# Patient Record
Sex: Female | Born: 1945
Health system: Southern US, Community
[De-identification: ages and names within clinical notes are randomized; demographics above are authoritative.]

## PROBLEM LIST (undated history)

## (undated) DIAGNOSIS — J019 Acute sinusitis, unspecified: Secondary | ICD-10-CM

## (undated) DIAGNOSIS — C801 Malignant (primary) neoplasm, unspecified: Secondary | ICD-10-CM

## (undated) DIAGNOSIS — I639 Cerebral infarction, unspecified: Secondary | ICD-10-CM

## (undated) DIAGNOSIS — Z8719 Personal history of other diseases of the digestive system: Secondary | ICD-10-CM

## (undated) DIAGNOSIS — Z8673 Personal history of transient ischemic attack (TIA), and cerebral infarction without residual deficits: Secondary | ICD-10-CM

## (undated) DIAGNOSIS — K219 Gastro-esophageal reflux disease without esophagitis: Secondary | ICD-10-CM

## (undated) DIAGNOSIS — Z860100 Personal history of colon polyps, unspecified: Secondary | ICD-10-CM

## (undated) DIAGNOSIS — Z86718 Personal history of other venous thrombosis and embolism: Secondary | ICD-10-CM

## (undated) DIAGNOSIS — Z85828 Personal history of other malignant neoplasm of skin: Secondary | ICD-10-CM

## (undated) DIAGNOSIS — E785 Hyperlipidemia, unspecified: Secondary | ICD-10-CM

## (undated) DIAGNOSIS — G250 Essential tremor: Secondary | ICD-10-CM

## (undated) DIAGNOSIS — H269 Unspecified cataract: Secondary | ICD-10-CM

## (undated) DIAGNOSIS — M1990 Unspecified osteoarthritis, unspecified site: Secondary | ICD-10-CM

## (undated) DIAGNOSIS — Z8489 Family history of other specified conditions: Secondary | ICD-10-CM

## (undated) DIAGNOSIS — Z8774 Personal history of (corrected) congenital malformations of heart and circulatory system: Secondary | ICD-10-CM

## (undated) DIAGNOSIS — Z9889 Other specified postprocedural states: Secondary | ICD-10-CM

## (undated) DIAGNOSIS — T148XXA Other injury of unspecified body region, initial encounter: Secondary | ICD-10-CM

## (undated) DIAGNOSIS — K5792 Diverticulitis of intestine, part unspecified, without perforation or abscess without bleeding: Secondary | ICD-10-CM

## (undated) DIAGNOSIS — M25862 Other specified joint disorders, left knee: Secondary | ICD-10-CM

## (undated) HISTORY — DX: Unspecified cataract: H26.9

## (undated) HISTORY — PX: PATENT FORAMEN OVALE CLOSURE: SHX2181

## (undated) HISTORY — PX: COLONOSCOPY: SHX174

## (undated) HISTORY — PX: UPPER GASTROINTESTINAL ENDOSCOPY: SHX188

## (undated) HISTORY — PX: POLYPECTOMY: SHX149

## (undated) HISTORY — DX: Hyperlipidemia, unspecified: E78.5

## (undated) HISTORY — PX: TONSILLECTOMY: SUR1361

## (undated) HISTORY — PX: KNEE ARTHROSCOPY: SUR90

## (undated) HISTORY — PX: APPENDECTOMY: SHX54

## (undated) HISTORY — PX: TOENAIL TRIMMING: SHX6631

---

## 1980-03-04 HISTORY — PX: ABDOMINAL HYSTERECTOMY: SHX81

## 1997-03-04 HISTORY — PX: LAPAROSCOPIC CHOLECYSTECTOMY: SUR755

## 1998-10-05 ENCOUNTER — Other Ambulatory Visit: Admission: RE | Admit: 1998-10-05 | Discharge: 1998-10-05 | Payer: Self-pay | Admitting: Obstetrics & Gynecology

## 1999-06-08 ENCOUNTER — Ambulatory Visit (HOSPITAL_COMMUNITY): Admission: RE | Admit: 1999-06-08 | Discharge: 1999-06-08 | Payer: Self-pay | Admitting: Orthopedic Surgery

## 1999-10-08 ENCOUNTER — Other Ambulatory Visit: Admission: RE | Admit: 1999-10-08 | Discharge: 1999-10-08 | Payer: Self-pay | Admitting: Obstetrics & Gynecology

## 2000-10-02 ENCOUNTER — Other Ambulatory Visit: Admission: RE | Admit: 2000-10-02 | Discharge: 2000-10-02 | Payer: Self-pay | Admitting: Obstetrics and Gynecology

## 2001-10-02 ENCOUNTER — Other Ambulatory Visit: Admission: RE | Admit: 2001-10-02 | Discharge: 2001-10-02 | Payer: Self-pay | Admitting: Obstetrics and Gynecology

## 2002-10-13 ENCOUNTER — Other Ambulatory Visit: Admission: RE | Admit: 2002-10-13 | Discharge: 2002-10-13 | Payer: Self-pay | Admitting: Obstetrics and Gynecology

## 2003-11-27 ENCOUNTER — Emergency Department (HOSPITAL_COMMUNITY): Admission: EM | Admit: 2003-11-27 | Discharge: 2003-11-27 | Payer: Self-pay | Admitting: Emergency Medicine

## 2003-12-07 ENCOUNTER — Encounter: Admission: RE | Admit: 2003-12-07 | Discharge: 2003-12-07 | Payer: Self-pay | Admitting: Orthopedic Surgery

## 2004-11-27 ENCOUNTER — Encounter: Admission: RE | Admit: 2004-11-27 | Discharge: 2004-11-27 | Payer: Self-pay | Admitting: Specialist

## 2004-12-31 ENCOUNTER — Other Ambulatory Visit: Admission: RE | Admit: 2004-12-31 | Discharge: 2004-12-31 | Payer: Self-pay | Admitting: Family Medicine

## 2005-03-04 DIAGNOSIS — I639 Cerebral infarction, unspecified: Secondary | ICD-10-CM

## 2005-03-04 HISTORY — DX: Cerebral infarction, unspecified: I63.9

## 2005-03-15 ENCOUNTER — Inpatient Hospital Stay (HOSPITAL_COMMUNITY): Admission: AD | Admit: 2005-03-15 | Discharge: 2005-03-19 | Payer: Self-pay | Admitting: Neurology

## 2005-03-15 ENCOUNTER — Encounter: Payer: Self-pay | Admitting: Neurology

## 2005-03-18 ENCOUNTER — Encounter (INDEPENDENT_AMBULATORY_CARE_PROVIDER_SITE_OTHER): Payer: Self-pay | Admitting: *Deleted

## 2005-03-19 ENCOUNTER — Encounter (INDEPENDENT_AMBULATORY_CARE_PROVIDER_SITE_OTHER): Payer: Self-pay | Admitting: *Deleted

## 2005-06-25 ENCOUNTER — Encounter: Admission: RE | Admit: 2005-06-25 | Discharge: 2005-06-25 | Payer: Self-pay | Admitting: Cardiology

## 2005-07-01 ENCOUNTER — Ambulatory Visit (HOSPITAL_COMMUNITY): Admission: RE | Admit: 2005-07-01 | Discharge: 2005-07-02 | Payer: Self-pay | Admitting: Cardiology

## 2007-05-22 ENCOUNTER — Observation Stay (HOSPITAL_COMMUNITY): Admission: EM | Admit: 2007-05-22 | Discharge: 2007-05-23 | Payer: Self-pay | Admitting: Emergency Medicine

## 2007-07-20 ENCOUNTER — Ambulatory Visit: Payer: Self-pay | Admitting: Gastroenterology

## 2007-07-28 ENCOUNTER — Telehealth: Payer: Self-pay | Admitting: Gastroenterology

## 2007-08-03 ENCOUNTER — Ambulatory Visit: Payer: Self-pay | Admitting: Gastroenterology

## 2007-08-30 ENCOUNTER — Encounter: Admission: RE | Admit: 2007-08-30 | Discharge: 2007-08-30 | Payer: Self-pay | Admitting: Sports Medicine

## 2007-10-05 ENCOUNTER — Telehealth: Payer: Self-pay | Admitting: Gastroenterology

## 2007-10-22 DIAGNOSIS — Z860100 Personal history of colon polyps, unspecified: Secondary | ICD-10-CM | POA: Insufficient documentation

## 2007-10-22 DIAGNOSIS — K5730 Diverticulosis of large intestine without perforation or abscess without bleeding: Secondary | ICD-10-CM | POA: Insufficient documentation

## 2007-10-22 DIAGNOSIS — M810 Age-related osteoporosis without current pathological fracture: Secondary | ICD-10-CM | POA: Insufficient documentation

## 2007-10-22 DIAGNOSIS — R259 Unspecified abnormal involuntary movements: Secondary | ICD-10-CM

## 2007-10-22 DIAGNOSIS — E785 Hyperlipidemia, unspecified: Secondary | ICD-10-CM

## 2007-10-22 DIAGNOSIS — K644 Residual hemorrhoidal skin tags: Secondary | ICD-10-CM | POA: Insufficient documentation

## 2007-10-22 DIAGNOSIS — K219 Gastro-esophageal reflux disease without esophagitis: Secondary | ICD-10-CM

## 2007-10-22 DIAGNOSIS — K648 Other hemorrhoids: Secondary | ICD-10-CM | POA: Insufficient documentation

## 2007-10-22 DIAGNOSIS — K589 Irritable bowel syndrome without diarrhea: Secondary | ICD-10-CM | POA: Insufficient documentation

## 2007-10-23 ENCOUNTER — Ambulatory Visit: Payer: Self-pay | Admitting: Gastroenterology

## 2007-10-23 DIAGNOSIS — Z8679 Personal history of other diseases of the circulatory system: Secondary | ICD-10-CM | POA: Insufficient documentation

## 2007-10-23 LAB — CONVERTED CEMR LAB: Tissue Transglutaminase Ab, IgA: 0.5 units (ref <7)

## 2007-10-28 ENCOUNTER — Telehealth: Payer: Self-pay | Admitting: Gastroenterology

## 2007-10-30 ENCOUNTER — Encounter: Payer: Self-pay | Admitting: Gastroenterology

## 2007-11-04 ENCOUNTER — Ambulatory Visit (HOSPITAL_BASED_OUTPATIENT_CLINIC_OR_DEPARTMENT_OTHER): Admission: RE | Admit: 2007-11-04 | Discharge: 2007-11-04 | Payer: Self-pay | Admitting: Orthopedic Surgery

## 2008-01-12 ENCOUNTER — Other Ambulatory Visit: Admission: RE | Admit: 2008-01-12 | Discharge: 2008-01-12 | Payer: Self-pay | Admitting: Family Medicine

## 2008-04-14 ENCOUNTER — Telehealth: Payer: Self-pay | Admitting: Gastroenterology

## 2008-04-15 ENCOUNTER — Ambulatory Visit: Payer: Self-pay | Admitting: Gastroenterology

## 2008-04-15 DIAGNOSIS — R197 Diarrhea, unspecified: Secondary | ICD-10-CM

## 2008-04-15 LAB — CONVERTED CEMR LAB
ALT: 15 units/L (ref 0–35)
AST: 15 units/L (ref 0–37)
Albumin: 3.8 g/dL (ref 3.5–5.2)
Alkaline Phosphatase: 56 units/L (ref 39–117)
BUN: 13 mg/dL (ref 6–23)
Basophils Absolute: 0.1 10*3/uL (ref 0.0–0.1)
Basophils Relative: 1 % (ref 0.0–3.0)
Bilirubin, Direct: 0.1 mg/dL (ref 0.0–0.3)
CO2: 29 meq/L (ref 19–32)
Calcium: 9.5 mg/dL (ref 8.4–10.5)
Chloride: 104 meq/L (ref 96–112)
Creatinine, Ser: 0.8 mg/dL (ref 0.4–1.2)
Eosinophils Absolute: 0.3 10*3/uL (ref 0.0–0.7)
Eosinophils Relative: 1.9 % (ref 0.0–5.0)
Ferritin: 40.7 ng/mL (ref 10.0–291.0)
Folate: >20 ng/mL
GFR calc Af Amer: 93 mL/min
GFR calc non Af Amer: 77 mL/min
Glucose, Bld: 98 mg/dL (ref 70–99)
HCT: 38.4 % (ref 36.0–46.0)
Hemoglobin: 12.8 g/dL (ref 12.0–15.0)
Iron: 67 ug/dL (ref 42–145)
Lymphocytes Relative: 22.5 % (ref 12.0–46.0)
MCHC: 33.3 g/dL (ref 30.0–36.0)
MCV: 86.4 fL (ref 78.0–100.0)
Monocytes Absolute: 1 10*3/uL (ref 0.1–1.0)
Monocytes Relative: 7.1 % (ref 3.0–12.0)
Neutro Abs: 9.7 10*3/uL — ABNORMAL HIGH (ref 1.4–7.7)
Neutrophils Relative %: 67.5 % (ref 43.0–77.0)
Platelets: 307 10*3/uL (ref 150–400)
Potassium: 4.3 meq/L (ref 3.5–5.1)
RBC: 4.45 M/uL (ref 3.87–5.11)
RDW: 13.3 % (ref 11.5–14.6)
Saturation Ratios: 16 % — ABNORMAL LOW (ref 20.0–50.0)
Sed Rate: 20 mm/hr (ref 0–22)
Sodium: 139 meq/L (ref 135–145)
TSH: 1.28 microintl units/mL (ref 0.35–5.50)
Total Bilirubin: 0.6 mg/dL (ref 0.3–1.2)
Total Protein: 6.6 g/dL (ref 6.0–8.3)
Transferrin: 299.4 mg/dL (ref >=212.0)
Vitamin B-12: 431 pg/mL (ref 211–911)
WBC: 14.3 10*3/uL — ABNORMAL HIGH (ref 4.5–10.5)

## 2008-04-19 ENCOUNTER — Encounter: Payer: Self-pay | Admitting: Gastroenterology

## 2008-05-13 ENCOUNTER — Ambulatory Visit: Payer: Self-pay | Admitting: Gastroenterology

## 2008-05-13 DIAGNOSIS — R1011 Right upper quadrant pain: Secondary | ICD-10-CM

## 2008-05-13 DIAGNOSIS — F411 Generalized anxiety disorder: Secondary | ICD-10-CM

## 2008-08-08 ENCOUNTER — Inpatient Hospital Stay (HOSPITAL_COMMUNITY): Admission: RE | Admit: 2008-08-08 | Discharge: 2008-08-11 | Payer: Self-pay | Admitting: Orthopedic Surgery

## 2008-08-08 HISTORY — PX: TOTAL KNEE ARTHROPLASTY: SHX125

## 2008-08-09 ENCOUNTER — Ambulatory Visit: Payer: Self-pay | Admitting: Vascular Surgery

## 2008-08-09 ENCOUNTER — Encounter (INDEPENDENT_AMBULATORY_CARE_PROVIDER_SITE_OTHER): Payer: Self-pay | Admitting: Orthopedic Surgery

## 2008-09-12 ENCOUNTER — Encounter: Admission: RE | Admit: 2008-09-12 | Discharge: 2008-11-09 | Payer: Self-pay | Admitting: Orthopedic Surgery

## 2008-09-27 ENCOUNTER — Telehealth: Payer: Self-pay | Admitting: Gastroenterology

## 2009-08-09 ENCOUNTER — Encounter: Admission: RE | Admit: 2009-08-09 | Discharge: 2009-08-09 | Payer: Self-pay | Admitting: Orthopedic Surgery

## 2010-02-21 ENCOUNTER — Ambulatory Visit
Admission: RE | Admit: 2010-02-21 | Discharge: 2010-02-21 | Payer: Self-pay | Source: Home / Self Care | Attending: Orthopedic Surgery | Admitting: Orthopedic Surgery

## 2010-03-25 ENCOUNTER — Encounter: Payer: Self-pay | Admitting: Sports Medicine

## 2010-05-14 LAB — POCT HEMOGLOBIN-HEMACUE: Hemoglobin: 13.1 g/dL (ref 12.0–15.0)

## 2010-06-11 LAB — CBC
HCT: 27.9 % — ABNORMAL LOW (ref 36.0–46.0)
HCT: 28.6 % — ABNORMAL LOW (ref 36.0–46.0)
HCT: 31.8 % — ABNORMAL LOW (ref 36.0–46.0)
Hemoglobin: 10.9 g/dL — ABNORMAL LOW (ref 12.0–15.0)
Hemoglobin: 9.3 g/dL — ABNORMAL LOW (ref 12.0–15.0)
Hemoglobin: 9.7 g/dL — ABNORMAL LOW (ref 12.0–15.0)
MCHC: 33.3 g/dL (ref 30.0–36.0)
MCHC: 33.9 g/dL (ref 30.0–36.0)
MCHC: 34.2 g/dL (ref 30.0–36.0)
MCV: 84.6 fL (ref 78.0–100.0)
MCV: 85.2 fL (ref 78.0–100.0)
MCV: 85.6 fL (ref 78.0–100.0)
Platelets: 208 10*3/uL (ref 150–400)
Platelets: 224 10*3/uL (ref 150–400)
Platelets: 251 10*3/uL (ref 150–400)
RBC: 3.26 MIL/uL — ABNORMAL LOW (ref 3.87–5.11)
RBC: 3.39 MIL/uL — ABNORMAL LOW (ref 3.87–5.11)
RBC: 3.74 MIL/uL — ABNORMAL LOW (ref 3.87–5.11)
RDW: 14.4 % (ref 11.5–15.5)
RDW: 14.4 % (ref 11.5–15.5)
RDW: 14.8 % (ref 11.5–15.5)
WBC: 14.1 10*3/uL — ABNORMAL HIGH (ref 4.0–10.5)
WBC: 14.7 10*3/uL — ABNORMAL HIGH (ref 4.0–10.5)
WBC: 15.2 10*3/uL — ABNORMAL HIGH (ref 4.0–10.5)

## 2010-06-11 LAB — BASIC METABOLIC PANEL
BUN: 6 mg/dL (ref 6–23)
BUN: 9 mg/dL (ref 6–23)
CO2: 30 mEq/L (ref 19–32)
CO2: 30 mEq/L (ref 19–32)
Calcium: 8.3 mg/dL — ABNORMAL LOW (ref 8.4–10.5)
Calcium: 8.5 mg/dL (ref 8.4–10.5)
Chloride: 104 mEq/L (ref 96–112)
Chloride: 104 mEq/L (ref 96–112)
Creatinine, Ser: 0.8 mg/dL (ref 0.4–1.2)
Creatinine, Ser: 0.82 mg/dL (ref 0.4–1.2)
GFR calc Af Amer: >60 mL/min (ref >60)
GFR calc Af Amer: >60 mL/min (ref >60)
GFR calc non Af Amer: >60 mL/min (ref >60)
GFR calc non Af Amer: >60 mL/min (ref >60)
Glucose, Bld: 136 mg/dL — ABNORMAL HIGH (ref 70–99)
Glucose, Bld: 137 mg/dL — ABNORMAL HIGH (ref 70–99)
Potassium: 3.8 mEq/L (ref 3.5–5.1)
Potassium: 3.8 mEq/L (ref 3.5–5.1)
Sodium: 137 mEq/L (ref 135–145)
Sodium: 138 mEq/L (ref 135–145)

## 2010-06-11 LAB — TYPE AND SCREEN
ABO/RH(D): B NEG
Antibody Screen: NEGATIVE

## 2010-06-11 LAB — PROTIME-INR
INR: 1.1 (ref 0.00–1.49)
INR: 1.4 (ref 0.00–1.49)
Prothrombin Time: 14.4 seconds (ref 11.6–15.2)
Prothrombin Time: 17.9 seconds — ABNORMAL HIGH (ref 11.6–15.2)

## 2010-06-11 LAB — ABO/RH: ABO/RH(D): B NEG

## 2010-06-12 LAB — CBC
Hemoglobin: 12.1 g/dL (ref 12.0–15.0)
MCHC: 32.7 g/dL (ref 30.0–36.0)
MCV: 85.4 fL (ref 78.0–100.0)
RBC: 4.34 MIL/uL (ref 3.87–5.11)

## 2010-06-12 LAB — COMPREHENSIVE METABOLIC PANEL
ALT: 16 U/L (ref 0–35)
CO2: 27 mEq/L (ref 19–32)
Calcium: 9.6 mg/dL (ref 8.4–10.5)
Creatinine, Ser: 0.8 mg/dL (ref 0.4–1.2)
GFR calc non Af Amer: >60 mL/min (ref >60)
Glucose, Bld: 80 mg/dL (ref 70–99)
Sodium: 141 mEq/L (ref 135–145)
Total Bilirubin: 0.6 mg/dL (ref 0.3–1.2)

## 2010-06-12 LAB — URINALYSIS, ROUTINE W REFLEX MICROSCOPIC
Bilirubin Urine: NEGATIVE
Hgb urine dipstick: NEGATIVE
Specific Gravity, Urine: 1.006 (ref 1.005–1.030)
pH: 7 (ref 5.0–8.0)

## 2010-06-12 LAB — PROTIME-INR: Prothrombin Time: 13.3 seconds (ref 11.6–15.2)

## 2010-07-17 NOTE — H&P (Signed)
Erica Alvarez, Erica Alvarez NO.:  [id number]   MEDICAL RECORD NO.:  [id number]          PATIENT TYPE:  INP   LOCATION:  0008                         FACILITY:  [facility]   PHYSICIAN:  Ollen Gross, M.D.    DATE OF BIRTH:  1945-05-07   DATE OF ADMISSION:  08/08/2008  DATE OF DISCHARGE:                              HISTORY & PHYSICAL   DATE OF OFFICE VISIT AND HISTORY AND PHYSICAL:  Jul 26, 2008.   CHIEF COMPLAINT:  Right knee pain.   HISTORY OF PRESENT ILLNESS:  The patient is a 65 year old female who has  seen by Dr. Lequita Halt with regards to her knees.  She has been having  problems with her knees for quite some time now.  The left knee has been  bothering her for a long time, but the right knee has started to hurt  more recently.  She has been seen in the office.  She has undergone knee  arthroscopy and debridement of the right knee, but despite surgical  intervention, she continues to have increasing pain.  It is felt at this  time she would benefit from undergoing knee replacement.  Risks and  benefits have been discussed.  She elects to surgery.  She has been seen  preoperative Dr. Abigail Miyamoto and felt to be stable for surgery.   ALLERGIES:  PENICILLIN causes itching.   Allergies also to:  1. SULFA.  2. TETRACYCLINE.  3. MEDROL.  4. PERCOCET, which causes itching.   CURRENT MEDICATIONS:  Propranolol, Protonix, Trilipix, fish oil,  aspirin, vitamin D, calcium, Centrum, thiamine, and also valium.   PAST MEDICAL HISTORY:  1. History of mild CVA in 2007.  2. Hypercholesterolemia.  3. Hiatal hernia.  4. Irritable bowel syndrome.  5. History of a patent foramen ovale, but she has had surgery for an      implant.  6. Diverticulosis.  7. Past history of left leg DVT (no PE).  8. Postmenopausal.  9. History of measles and mumps.   PAST SURGICAL HISTORY:  1. Gallbladder surgery.  2. Knee scopes.  3. Appendectomy.  4. Tonsillectomy.  5. Hysterectomy.  6. Heart  implant for a PFO 2 years ago.   FAMILY HISTORY:  Father with stomach cancer and colon cancer.  Mother  deceased at age 67 with mental illness.   SOCIAL HISTORY:  Married, works at [employer] work.  Quit smoking about 13  to 14 years ago.  Glass of wine twice a week.  Two children.  She wants  to go to [facility] to look into a skilled rehab.  She lives in a two-  story home.   REVIEW OF SYSTEMS:  GENERAL:  No fevers, chills, or night sweats.  NEURO:  No seizures, syncope, or paralysis.  RESPIRATORY: No shortness  breath, productive cough, or hemoptysis.  CARDIOVASCULAR:  No chest  pain, no orthopnea.  GI:  Intermittent diarrhea.  No nausea, vomiting,  or constipation.  GU:  No dysuria, hematuria, or discharge.  MUSCULOSKELETAL:  Knee pain.   PHYSICAL EXAMINATION:  VITAL SIGNS:  Pulse 76, respirations 16, blood  pressure  132/76.  GENERAL:  This is a 62-year white female, well-nourished, well-  developed, in no acute distress.  She is alert and oriented,  cooperative, pleasant, mildly anxious.  HEENT:  Normocephalic, atraumatic.  Pupils are equal, round, and  reactive.  EOMs intact.  NECK:  Supple.  CHEST:  Clear.  HEART:  Regular rate and rhythm.  No murmur.  S1 and S2 noted.  ABDOMEN:  Soft, slightly round.  Bowel sounds present.  RECTAL, BREASTS, GENITALIA:  Not done, not pertinent to present illness.  EXTREMITIES:  Right knee range of motion 0 to 115.  Motor intact.  No  effusion.   IMPRESSION:  Osteoarthritis, right knee.   PLAN:  The patient admitted to [facility] to undergo right  total knee replacement arthroplasty.  Surgery will be performed by Ollen Gross.      Alexzandrew L. Perkins, P.A.C.      Ollen Gross, M.D.  Electronically Signed    ALP/MEDQ  D:  08/07/2008  T:  08/08/2008  Job:  [id number]   cc:   Ollen Gross, M.D.  Fax: [phone]   Chales Salmon. Abigail Miyamoto, M.D.  Fax: [phone]   Elmore Guise., M.D.  Fax: [phone]

## 2010-07-17 NOTE — Op Note (Signed)
[name], BOW NO.:  [id number]   MEDICAL RECORD NO.:  [id number]          PATIENT TYPE:  AMB   LOCATION:  NESC                         FACILITY:  [facility]   PHYSICIAN:  Erica Alvarez, M.D.    DATE OF BIRTH:  1945-10-02   DATE OF PROCEDURE:  11/04/2007  DATE OF DISCHARGE:                               OPERATIVE REPORT   PREOPERATIVE DIAGNOSIS:  Right knee lateral medial meniscal tear.   POSTOPERATIVE DIAGNOSIS:  Right knee lateral medial meniscal tear plus  medial meniscus tear and chondral defect laterally.   PROCEDURE:  Right knee arthroscopy with medial and lateral meniscal  debridement and chondroplasty lateral femoral condyle.   SURGEON:  Erica Alvarez, M.D.   ASSISTANT:  No assistant.   ANESTHESIA:  General.   ESTIMATED BLOOD LOSS:  Minimal.   DRAINS:  None.   COMPLICATIONS:  None.   CONDITION:  Stable to recovery.   CLINICAL NOTE:  Erica Alvarez is a 65 year old female with a several month  history of significant right knee pain and mechanical symptoms.  Exam  and history suggested a lateral meniscus tear.  She had some early  arthritis on regular x-rays.  MRI confirmed the lateral meniscus tear.  She presents for arthroscopy and debridement.   PROCEDURE IN DETAIL:  After the successful administration of general  anesthetic, tourniquet was placed high on the right thigh and right  lower extremity prepped and draped in the usual sterile fashion.  Standard superomedial and inferolateral incisions were made.  In flow  cannula passed superomedial and camera passed inferolateral.  Arthroscopic visualization proceeds.  Undersurface of the patella shows  some minimal chondromalacia.  The trochlea shows some grade III changes  just centrally without any unstable appearing cartilage.  Medial and  lateral gutters were visualized and there were no loose bodies.  Flexion  and valgus force was applied to the knee and the medial compartments  entered.   Immediately upon entering there was noted that there is a tear  in the body and anterior horn of the medial meniscus.  A spinal needle  was used to localize the inferomedial portal.  Small incision made,  dilator placed and then we debrided the meniscus back to a stable base  with baskets, 4.2 mm shaver and sealed it off with the ArthroCare.  She  did have small focal areas of grade IV change in the medial tibial  plateau as well as on the medial femoral condyle.  These were small  areas but nonetheless there was exposed bone in these small areas.  The  edges were probed and there was no unstable cartilage in the medial  compartment.  The intercondylar notch is visualized and the ACL was  normal.  The lateral compartment is entered and there is evidence of  some cartilage delaminating off of the lateral femoral condyle.  There  is also a lateral meniscal tear.  The tear was addressed first.  It was  debrided back to a stable base with the baskets and shaver and sealed  off with the ArthroCare.  The defect on the  lateral femoral condyle is  debrided back to a stable base with the shaver and stable cartilaginous  edges.  It is not full thickness down to bone but there is minimal  residual covering of the bone with the remaining cartilage at the base  of the defect.  The defect is about 1 x 1 cm.  The joint again was  inspected and no further tears, loose bodies or defects.  Arthroscopic  equipment was then removed from the inferior portals which were closed  with interrupted 4-0 nylon.  Twenty mL of 0.25% Marcaine with epi were  injected through the inflow cannula then that is removed and that portal  closed with nylon.  Bulky sterile dressing is then applied and she was  awakened and transported to recovery in stable condition.      Erica Alvarez, M.D.  Electronically Signed     FA/MEDQ  D:  11/04/2007  T:  11/04/2007  Job:  [id number]

## 2010-07-17 NOTE — Discharge Summary (Signed)
NAMEDNYLA, ANTONETTI NO.:  [id number]   MEDICAL RECORD NO.:  [id number]          PATIENT TYPE:  INP   LOCATION:  1617                         [facility]ACILITY:  [facility]   PHYSICIAN:  Ollen Gross, M.D.    DATE OF BIRTH:  06/13/1945   DATE OF ADMISSION:  08/08/2008  DATE OF DISCHARGE:  08/11/2008                               DISCHARGE SUMMARY   ADMITTING DIAGNOSES:  1. Osteoarthritis right knee.  2. History of mild cerebrovascular accident in 2007.  3. Hypercholesterolemia.  4. Hiatal hernia.  5. Irritable bowel syndrome.  6. Past history of patent foramen ovale, postsurgical implant.  7. Diverticulosis.  8. Past history of deep vein thrombosis (no pulmonary embolism).  9. Postmenopausal.  10.Past history of measles, mumps.   DISCHARGE DIAGNOSES:  1. Osteoarthritis bilateral knees, status post right total knee      arthroplasty, status post left knee cortisone injection.  2. Mild postoperative blood loss anemia.  Did not require transfusion.  3. History of mild cerebrovascular accident in 2007.  4. Hypercholesterolemia.  5. Hiatal hernia.  6. Irritable bowel syndrome.  7. Past history of patent foramen ovale, postsurgical implant.  8. Diverticulosis.  9. Past history of deep vein thrombosis (no pulmonary embolism).  10.Postmenopausal.  11.Past history of measles, mumps.   PROCEDURE:  August 08, 2008 - right total knee with a left knee cortisone  injection.  Surgeon - Dr. Lequita Halt.  Assistant - Avel Peace PA-C.  Surgery done under spinal anesthesia.  Tourniquet time 30 minutes.   CONSULTATIONS:  None.   BRIEF HISTORY:  Ms. Lyman is a 65 year old female with end-stage  arthritis of both knees, right more symptomatic than left, who failed  non-operative management and now presents for a total knee arthroplasty  and will put a cortisone injection in the left knee at the same time.   LABORATORY DATA:  Preoperative CBC showed hemoglobin of 12.1, hematocrit  of  37.1, white cell count 10.1, platelets 294.  PT/INR 13.3 and 1.0, PTT  of 31.  Chem panel on admission all within normal limits.  Preoperative  UA was negative.  Serial CBCs were followed.  Hemoglobin dropped down to  10.9, then 9.7.  Last noted hemoglobin and hematocrit of 9.3 and 27.9.  Serial pro times followed per Coumadin protocol.  Last noted PT/INR of  21.5 and 1.8.  Serial BMETs were followed for 2 days.  Electrolytes  remained within normal limits.   EKG on Jul 29, 2008 - normal sinus rhythm, normal EKG confirmed by Dr.  Lady Deutscher.   HOSPITAL COURSE:  The patient was admitted to [facility],  taken to the OR and underwent the above-stated procedure without  complication.  The patient tolerated the procedure well and was later  transferred to the recovery room and orthopedic floor.  Started on PCA  and p.o. analgesic pain control.  Following surgery, had a rough night  with pain.  Encouraged p.o. with PCA pain medications.  Doing a little  bit better on the morning of day one.  Started getting up with therapy.  She initially  wanted to look into a skilled rehab facility, so we got  social work involved.  She wanted look into possibly going to  [facility].  She had a prior history of DVT and was on chronic  Coumadin, so we ordered Lovenox postoperatively for bridge.  Started  back on her home medications.  She had a little bit of low urinary  output so we gave her some extra fluids to help out.  She did have  Duramorph in her spinal which probably aided in the low urinary output,  but she responded to the fluids well, and her output flows had picked  up.  She started getting up out of bed and actually walked about 30 feet  on day one, doing well.  We signed the FL-2 to help out with placement.  By day two, she was doing better, output had picked up.  Hemoglobin was  down a little bit at 9.7, felt to be possibly dilutional or blood loss.  We put her on some iron  supplementation.  We changed the dressing on day  two.  The incision was healing well, no signs of infection.  Electrolytes looked good.  Continued to progress.  By day three, she was  doing well.  She had been weaned over to p.o. medications.  Hemoglobin  was at 9.3.  She was asymptomatic with this.  We kept her on her iron  supplementation.  It was noted that a bed became available over at the  [facility] facility, checked the insurance, and we decided the patient  would be transferred at that time.   DISCHARGE/PLAN:  1. Transfer over to [facility] on August 11, 2008.  2. Discharge diagnoses - please see above.   CURRENT MEDICATIONS AT TIME OF TRANSFER:  1. Coumadin protocol.  Please titrate the Coumadin level for target      INR between 2.0 and 3.0.  She needs to be on Coumadin for 3 weeks      postoperatively.  Please note she was not on Coumadin.  She had      already been taken off preoperatively, so she just needs to be on      Coumadin for 3 weeks per our protocol.  2. Colace 100 mg p.o. b.i.d.  3. Protonix 40 mg p.o. daily.  4. Propranolol 10 mg p.o. b.i.d.  5. Trilipix 45 mg p.o. nightly.  6. Lovenox 30 mg subcutaneous injection every 12 hours.  Continue      Lovenox until the INR is greater than or equal to 2.0.  Once the      INR is at 2.0 or greater, discontinue the Lovenox.  7. Nu-Iron 150 mg p.o. daily.  8. Tylenol 325 one or two every 4-6 hours as need for mild pain,      temperature or headache.  9. Laxative of choice.  10.Enema of choice.  11.Robaxin 500 mg p.o. q.6h. p.r.n. spasm.  12.Vicodin 5 mg one or two every 4 hours as needed for pain.  13.Valium 2.5 mg every 6-8 hours p.r.n.   DIET:  Low-cholesterol diet.   ACTIVITY:  She is weightbearing as tolerated to the right lower  extremity.  Total knee protocol.  PT and OT for gait training,  ambulation, ADLs.  She may start showering; however, do not submerge the  incision under water.  She needs to wear  her TED hose during the day,  but she may have them off at night.  Daily dressing change to the right  knee.  She is weightbearing as tolerated.   FOLLOWUP:  She needs to follow up with Dr. Lequita Halt in the office  approximately 2 weeks from the date of surgery.  Please contact the  office at [phone] to help make arrangements for this patient to follow  up with Dr. Lequita Halt at the [place] office of [facility] over in [facility].   DISPOSITION:  [facility].   CONDITION ON DISCHARGE:  Improving.   COUMADIN REGIMEN:  During the hospital stay, her Coumadin regimen was as  follows.  Her INR on admission was 1.0 and on the evening of surgery she  received a 5 mg tablet.  On postoperative day #1, her INR was 1.1.  She  received a 5 mg tablet.  On postoperative day #2, her INR was 1.4.  She  received 5 mg tablet.  On postoperative day #3, at the time of  discharge, her INR was increasing up to 1.8.      Alexzandrew L. Perkins, P.A.C.      Ollen Gross, M.D.  Electronically Signed    ALP/MEDQ  D:  08/11/2008  T:  08/11/2008  Job:  [id number]   cc:   Chales Salmon. Abigail Miyamoto, M.D.  Fax: [phone]   Elmore Guise., M.D.  Fax: [phone]   With Patient to [facility]

## 2010-07-17 NOTE — H&P (Signed)
Erica Alvarez, Erica Alvarez NO.:  [id number]   MEDICAL RECORD NO.:  [id number]          PATIENT TYPE:  EMS   LOCATION:  MAJO                         FACILITY:  [facility]   PHYSICIAN:  Elmore Guise., M.D.DATE OF BIRTH:  November 17, 1945   DATE OF ADMISSION:  05/22/2007  DATE OF DISCHARGE:                              HISTORY & PHYSICAL   INDICATION FOR ADMISSION:  Presyncope and chest pain.   HISTORY OF PRESENT ILLNESS:  Ms. Kinlaw is a very pleasant 65 year old  white female, past medical history of TIA, PFO (status post percutaneous  closure 2007), hypertension, dyslipidemia, who presents after an episode  of presyncope and chest tightness.  The patient reports that she was  diagnosed with an upper respiratory tract infection and possible  sinusitis earlier this week.  She was given a shot of steroids and  started on a Z-Pak.  She was converted to oral prednisone.  She started  on her prednisone this morning.  After taking her medication, she went  to work, and while at work she was having malaise.  She became very  diaphoretic, felt clammy all over.  She went to sit down.  On trying to  stand up, she had a little bit of chest tightness with radiation to her  left shoulder and arm.  She then had a presyncopal spell.  EMS was  called.  On arrival there, the patient had a blood pressure of 170/90,  heart rate was in the 80s, showing sinus rhythm.  She was given an  aspirin as well as oxygen and it made me feel better.  She is now  resting comfortably with continued episodes of occasional chest wall  cramping.  Her breathing is better.  She denies any significant fever.  Denies cough.  She does report sinus headache and pressure.  She does  have mild baseline dyspnea.  Her last stress test was back in 2007 and  was normal.  Her last echo was after her percutaneous closure device,  showing a normal EF of 55% to 60%, no wall motion abnormalities and no  pericardial effusion.   She had no valvular heart disease noted.  She has  no orthopnea or PND and really has had no problems with presyncope or  syncope or palpitations in the past.   REVIEW OF SYSTEMS:  As per HPI.  All others are negative.   CURRENT MEDICATIONS:  Lipitor 20 mg daily, multivitamin once daily,  aspirin 81 mg daily, Inderal once daily, Protonix once daily, and  azithromycin once daily.   ALLERGIES:  SULFA, PENICILLIN, TETRACYCLINE.   FAMILY HISTORY:  Positive for cancer and hypertension.   SOCIAL HISTORY:  She is married.  History of tobacco, quit over 20 years  ago.  Occasional glass of wine is noted.   PHYSICAL EXAMINATION:  VITAL SIGNS:  She is afebrile.  Blood pressures  130/70, heart rate 62 to 80 and sinus rhythm, sat 99% on room air.  GENERAL:  She is a very pleasant middle-aged white female, alert and  oriented x4.  In no acute distress.  HEENT:  Shows mild cobblestoning in the posterior pharynx with very  minimal submandibular lymphadenopathy.  NECK:  She has no JVD.  LUNGS:  Clear.  HEART:  Regular with no rub.  ABDOMEN:  Soft, nontender, nondistended.  No rebound or guarding.  EXTREMITIES:  Warm with no significant edema.   Her blood work shows a BUN and creatinine of 12 and 0.7, potassium level  4.3.  White count of 18, hemoglobin of 13.9 and platelet count of 278.  Her myoglobin is 46 with an MB of less than 1.  Troponin I of 0.05.  Chest x-ray shows no acute cardiopulmonary disease.  Her EKG shows  normal sinus rhythm, 74 per minute, occasional PVC but no ST/T wave  changes.  She did have a mild left shift noted on her differential of  her white count.   IMPRESSION:  1. Presyncope.  2. Chest pain.  3. Recent sinus infection with leukocytosis, likely multifactorial,      including her infection as well as her steroid exposure.   PLAN:  1. At this time, we will admit for observation.  Her chest pain is      atypical and I wonder if some of this is secondary to her  recent      prednisone exposure.  She will be given a PPI with Protonix 40 mg      daily.  She will be placed on telemetry monitoring and have serial      cardiac enzymes performed.  She will have orthostatic blood      pressures checked.  We will continue her propranolol, aspirin,      Lipitor as before.  I will treat her pain with Percocet.  2. For her sinus infection, she will continue her azithromycin at 250      mg once daily.  She was given Avelox in the emergency room.  I will      recheck a CBC in the morning.  3. I would like to stay away from nitrates with her because of her      headaches, therefore, will start her on Ranexa 500 mg twice daily.      Should she be up and ambulatory in the morning and if her telemetry      monitoring as well as her enzymes are negative, I feel likely she      could  be discharged at that time with outpatient stress test if      needed.  I discussed this with her at length.  All her questions      were answered.      Elmore Guise., M.D.  Electronically Signed     TWK/MEDQ  D:  05/22/2007  T:  05/22/2007  Job:  [id number]

## 2010-07-17 NOTE — Op Note (Signed)
Erica Alvarez, Erica Alvarez NO.:  [id number]   MEDICAL RECORD NO.:  [id number]          PATIENT TYPE:  INP   LOCATION:  0008                         FACILITY:  [facility]   PHYSICIAN:  Ollen Gross, M.D.    DATE OF BIRTH:  1945-07-01   DATE OF PROCEDURE:  08/08/2008  DATE OF DISCHARGE:                               OPERATIVE REPORT   PREOPERATIVE DIAGNOSIS:  Osteoarthritis, bilateral knees.   POSTOPERATIVE DIAGNOSIS:  Osteoarthritis, bilateral knees.   PROCEDURE:  1. Right total knee arthroplasty.  2. Left knee cortisone injection.   SURGEON:  Ollen Gross, M.D.   ASSISTANT:  Alexzandrew L. Perkins, P.A.C.   ANESTHESIA:  Spinal.   ESTIMATED BLOOD LOSS:  Minimal.   DRAINS:  None.   COMPLICATIONS:  None.   CONDITION:  Stable to recovery.   TOURNIQUET TIME:  30 minutes at 300 mmHg.   BRIEF CLINICAL NOTE:  Erica Alvarez is a 65 year old female with end-stage  arthritis of both knees, right more symptomatic than the left.  She has  failed nonoperative management and presents now for right total knee  arthroplasty.   PROCEDURE IN DETAIL:  After successful administration of spinal  anesthetic, a tourniquet was placed high on her right thigh and her  right lower extremity was prepped and draped in the usual sterile  fashion.  The extremity was wrapped in Esmarch, knee flexed, tourniquet  inflated to 300 mmHg.  A midline incision was made with a 10 blade  through subcutaneous tissue to the level of the extensor mechanism.  A  fresh blade was used to make a medial parapatellar arthrotomy.  The soft  tissue on the proximal medial tibia was subperiosteally elevated to the  joint line with the knife and into the semimembranosus bursa with a Cobb  elevator.  The soft tissue laterally was elevated with attention being  paid to avoid the patellar tendon on the tibial tubercle.  The patella  was subluxed laterally, knee flexed 90 degrees and ACL and PCL were  removed.  A  drill was used to create a starting hole in the distal femur  and the canal was thoroughly irrigated.  The 5 degrees right valgus  alignment guide was placed and referencing off the posterior condyles  the rotation was marked and the block pinned to remove 10 mm off the  distal femur.  The distal femoral resection was made with an oscillating  saw.  The sizing block was placed and a size 2.5 was most appropriate.  Rotation was marked off the epicondylar axis.  The size 2.5 cutting  block was placed and the anterior, posterior and chamfer cuts were made.   The tibia was subluxed forward and the menisci are removed.  The  extramedullary tibial alignment guide was placed referencing proximally  at the medial aspect of the tibial tubercle and distally along the  second metatarsal axis of the tibial crest.  The block was pinned to  remove about 10 mm off the non-deficient lateral side.  Tibial resection  was made with an oscillating saw.  The size 2.5 was the  most appropriate  tibial component and the proximal tibia was prepared with the modular  drill and keel punch for the size 2.5.  the femoral preparation was  completed with the intercondylar cut.   The size 2.5 mobile bearing tibial trial, the 2.5 posterior stabilized  femoral trial and a 10-mm posterior stabilized rotating platform insert  trial were placed.  With a 10, there was a little bit of play, so I went  to the 12.5 which allowed for full extension with excellent varus,  valgus and anterior and posterior balance throughout full range of  motion.  The patella was everted.  This measured to be 21 mm.  The  freehand resection taken to 12 mm, a 35 template was placed, lug holes  were drilled, the trial patella was placed and it tracks normally.  Osteophytes were removed off the posterior femur with the trial in  place.  All trials are removed and the cut bone surfaces are prepared  with pulsatile lavage.  The cement was mixed and  once ready for  implantation a size 2.5 mobile bearing tibial tray, a 2.5 posterior  stabilized femur and 35 patella were cemented into place and the patella  was held with a clamp.  A trial 12.5 mm posterior stabilized rotating  platform insert was placed.  The knee was held in full extension and all  extruded cement removed.  Once the cement was fully hardened then the  permanent 12.5 mm posterior stabilized rotating platform insert was  placed in the tibial tray.  The wound was copiously irrigated with  saline solution and then the FloSeal injected on the posterior capsule,  medial and lateral gutters and the suprapatellar area.  A moist sponge  was placed and tourniquet released for a total time of 30 minutes.  The  sponge was held for 2 minutes and removed.  Minimal bleeding was  encountered.  The bleeding that was encountered was stopped with  electrocautery.  The wound was again irrigated and the arthrotomy closed  with interrupted #1 PDS.  Flexion against gravity was 140 degrees.  The  subcutaneous was closed with interrupted 2-0 Vicryl and subcuticular  running 4-0 Monocryl.  The incision was then cleaned and dried and Steri-  Strips and a bulky sterile dressing were applied.  She was then placed  into a knee immobilizer.  We then prepped the left knee and injected it  with lidocaine and Solu-Cortef with no problems.  She was then awakened  and transported to recovery in stable condition.      Ollen Gross, M.D.  Electronically Signed     FA/MEDQ  D:  08/08/2008  T:  08/08/2008  Job:  [id number]

## 2010-07-20 NOTE — H&P (Signed)
[name], [name] NO.:  [id number]   MEDICAL RECORD NO.:  [id number]          PATIENT TYPE:  EMS   LOCATION:  ED                           FACILITY:  [facility]   PHYSICIAN:  Marlan Palau, M.D.  DATE OF BIRTH:  02-Nov-1945   DATE OF ADMISSION:  03/15/2005  DATE OF DISCHARGE:                                HISTORY & PHYSICAL   HISTORY OF PRESENT ILLNESS:  Erica Alvarez is a 65 year old, right-  handed white female born on [birth year], with a history of  osteoporosis, but otherwise minimal ongoing active medical issues.  The  patient was in her usual state of health until last evening.  The patient  noted that she had a headache before she went to bed, but otherwise noted no  deficits.  The patient awakened around 3 a.m. with some leg cramps.  The  patient took her dog out.  When speaking to the dog, noted that she had  slurred speech.  Later on she noted that when she tried to brush her teeth  and drink some water, that water came out of her mouth on the left side, and  she noted that she dropped a cup with the left hand.  The patient did report  a headache which has persisted until today.  The patient denied any  dizziness, visual field changes, denies problems swallowing.  The patient  denies any numbness of the extremities.  The patient denies any chest pains  or palpitations of the heart.  The patient comes to the emergency room after  she went to work and coworkers clearly noted that something was wrong with  her.  The patient has had a CT scan of the brain which shows an evolving  right frontal infarction.  Neurology was asked to see this patient for  further evaluation.   PAST MEDICAL/SURGICAL HISTORY:  1.  History of new onset right frontal infarction, with left-sided weakness.  2.  Status post hysterectomy.  3.  Status post gallbladder resection.  4.  Tonsillectomy.  5.  Appendectomy.  6.  Left knee arthroscopic surgery in the past.  7.   History of bilateral toe surgery in the past.  8.  History of osteoporosis.  9.  Benign essential tremor.   MEDICATIONS:  The patient is currently on Boniva 2.5 mg daily and Inderal 10  mg, two daily.  The patient was not on aspirin.   ALLERGIES:  PENICILLIN AND TETRACYCLINE AND SULFA DRUGS.   SOCIAL HISTORY:  She does not smoke.  Drinks alcohol on occasion.  The  patient is married and lives in the [place] area.  The  patient is working at a clerical job.  Does have two sons alive and well.  Lives with her husband.   FAMILY HISTORY:  Mother died of natural causes.  Did have a history of  hypertension.  Maternal grandmother had diabetes.  Father died with stomach  cancer and colon cancer.  The patient has one sister who is alive and well.   REVIEW OF SYSTEMS:  Notable for  no fevers or chills.  The patient did note  headache.  Has had some hot flashes recently.  The headache was noted last  evening and today.  The patient has had sinus infection two or three weeks  ago that was treated.  Had some fevers with this.  Does note some occasional  shortness of breath with exertion.  Denies any chest pains, abdominal pains,  nausea or vomiting.  Has had some urinary incontinence that occurred  yesterday and today.  This has been somewhat of a problem for her off and  on.  The patient denies any blackout episodes or dizziness.  Has had some  slight staggering gait today.   PHYSICAL EXAMINATION:  VITAL SIGNS:  Blood pressure 145/91, heart rate 134,  respirations 18, temperature afebrile.  GENERAL:  This patient is a fairly well-developed white female who is alert  and cooperative at the time of examination.  HEENT:  Head is atraumatic.  Pupils equal, round, reactive to light.  Discs  are flat bilaterally.  Extraocular movements are full.  Visual fields are  full.  Speech is slightly dysarthric.  NECK:  Supple, no carotid bruits noted.  LUNGS:  Clear.  CARDIOVASCULAR:   Reveals a regular rate and rhythm.  No obvious murmur or  rub noted.  ABDOMEN:  Reveals positive bowel sounds.  No organomegaly or tenderness  noted.  EXTREMITIES:  Without significant edema.  NEUROLOGIC:  Cranial nerves as above.  The patient has a very definite left  flattening of the nasal labial fold, asymmetric smile.  Extraocular  movements are full.  Visual fields are full.  Speech is not aphasic.  Does  appear to be slightly dysarthric. Pinprick sensation on the face is  symmetric throughout.  The patient has fairly good strength to direct  testing both upper extremities.  No drift is seen in the upper extremities.  Mild drift is noted in both legs.  The patient has good finger-to-nose-to-  finger and heel-to-shin bilaterally.  Gait was not tested.  The patient has  some decreased pinprick sensation on the right leg as compared to the left.  Decreased vibratory sensation in the left leg as compared to the right.  Otherwise pinprick and vibratory sensation in the arms are normal.  The  patient has symmetric reflexes.  Toes neutral bilaterally.   LABORATORY DATA:  At this time notable for white count 13.8, hemoglobin  13.6, hematocrit 41.1, MCV 87.5, platelets 334.  INR 0.9.  Sodium 141,  potassium 4.2, chloride 109, CO2 of 27, glucose 108, BUN 11, creatinine 0.8,  calcium 9.3.   A chest x-ray and electrocardiogram are pending.  A CT of the head is as  above.   IMPRESSION/PLAN:  1.  New onset of right brain cerebral vascular infarction with mild left-      sided weakness.  This patient has an NI Stroke Scale currently of 5.      The patient will be brought in for further testing.  The patient is not      a TPA candidate because the time of onset is not clear.  The patient      appears to have had a deficit upon awakening at 3 a.m. today.  The      patient also has a clearly evolving stroke by CT scan, again putting     this patient at high risk for an intracranial hemorrhage  with TPA.   PLAN:  1.  Admission to [facility]  Stroke [facility].  2.  MRI of the brain.  3.  MR angiogram.  4.  A 2-D echocardiogram.  5.  Carotid Doppler study.  6.  Aspirin therapy.  7.  Lovenox.  8.  The patient will undergo a swallowing evaluation.   Will follow the patient's clinical course while in-house.      Marlan Palau, M.D.  Electronically Signed     CKW/MEDQ  D:  03/15/2005  T:  03/15/2005  Job:  [id number]   cc:   Chales Salmon. Abigail Miyamoto, M.D.  Fax: [phone]

## 2010-07-20 NOTE — Cardiovascular Report (Signed)
Erica Alvarez, Erica NO.:  [id number]   MEDICAL RECORD NO.:  [id number]          PATIENT TYPE:  OIB   LOCATION:  6531                         FACILITY:  [facility]   PHYSICIAN:  Vonna Kotyk R. Jacinto Halim, Erica Alvarez       DATE OF BIRTH:  17-Dec-1945   DATE OF PROCEDURE:  07/01/2005  DATE OF DISCHARGE:                              CARDIAC CATHETERIZATION   REFERRING PHYSICIANS:  1.  Dr. Lady Deutscher.  2.  Dr. Delia Heady.  3.  Dr. Henrine Screws.   PROCEDURE PERFORMED:  1.  Intracardiac echocardiogram.  2.  Sizing of the patent foramen ovale.  3.  Successful of the patent foramen ovale with the use of a 28-mm      CardioSEAL septal occluder.   INDICATION:  [name] is a 65 year old female with history of stroke  that occurred in January of 2007.  She had no other cardiovascular risk  factors.  Except for mild hyperlipidemia, there were no other significant  risk factors noted for her stroke and the stroke was felt to be  cardioembolic.  She underwent TEE evaluation and was found to have a large  patent foramen ovale with atrioseptal aneurysm and it was felt that this was  probably a culprit for her cardioembolic stroke.   After obtaining informed consent, she was brought to the catheterization  laboratory with an eye towards closure of the patent foramen ovale.  The  patient had previously been explained regarding a RESPECT Trial and also  regarding no large clinical trials supporting the procedure.  She understood  the risks and benefits.   INTRACARDIAC ECHOCARDIOGRAPHIC DATA:  Left atrium:  The left atrium was  normal.   Interatrial septum:  The interatrial septum was largely aneurysmal.  There  is a large patent foramen ovale with right-to-left shunting, both by color  Doppler and by double-contrast injection.   Right atrium:  The right atrium was normal.   Right ventricle.  The right ventricle was normal.   Left ventricle:  The left ventricle was normal.   Aortic valve:  The aortic valve was normal.   Mitral valve:  The mitral valve was normal.  Minimal mitral regurgitation.   Tricuspid valve:  The tricuspid valve was normal.   Pulmonary valve:  The pulmonary valve was normal.   INTERVENTIONAL DATA:  The PFO was measured with a sizing balloon.  The PFO  measured about 14 mm.  Hence, it was decided to proceed with deployment of a  28-mm CardioSEAL septal occluder.   Successful deployment of a 28-mm CardioSEAL occluder with excellent  sandwiching of the interatrial septum between the device.  Post device  deployment, there was no evidence of right-to-left shunting, either by color  Doppler or by double-contrast injection.   TECHNIQUE OF THE PROCEDURE:  Under the usual sterile precautions using an 8-  [place] right femoral venous access and a 9-French left femoral venous  access, an intracardiac echo probe was advanced into the inferior vena cava  through the left groin access and was placed in the right atrium.  The  intracardiac echocardiogram and cardiac structures were carefully  visualized.   Then a 6-French MP A2 catheter was advanced in through the right groin into  the inferior vena cava and into the right atrium and the catheter easily  advanced into the left atrium across the patent foramen ovale without any  manipulation.  After carefully positioning the catheter a 300-cm x 0.035-  inch Rosen with a soft tip was used and was carefully pushed into the left  upper pulmonary vein.  Then the multipurpose A2 catheter was withdrawn and a  sizing balloon was advanced into the interatrial septum and PFO was  carefully measured.  There was a nice focal waist and the PFO measured 40  mm.   It was decided to proceed with deployment of a 28-mm CardioSEAL septal  occluder.  After carefully preparing the septal occluder for deployment, the  septal occluder was carefully advanced into the left atrium and the left  regional side of the  device was deployed and under echo guidance, it was  carefully pulled back and tacked against the interatrial septum and the  right atrial size of the device as carefully deployed.  Excellent position  was noted.  Then double-contrast injection was performed to confirm the  success and also a color Doppler evaluation was performed.  Then the device  was released.  During the sizing of the balloon, color flow across the  interatrial septum did not reveal any other fenestrated septum.   Overall, the patient tolerated the procedure well.  No immediate  complication was noted.  During the procedure, the patient did receive 5000  units of IV heparin and the ACT was maintained at greater than 250.  After  withdrawing the 11-French Mullin sheath, a short sheath was placed into the  right groin access and sutured in place.      Cristy Hilts. Jacinto Halim, Erica Alvarez  Electronically Signed     JRG/MEDQ  D:  07/01/2005  T:  07/02/2005  Job:  [id number]   cc:   Elmore Guise., M.D.  Fax: [phone]   Pramod P. Pearlean Brownie, Erica Alvarez  Fax: [phone]   Chales Salmon. Abigail Miyamoto, M.D.  Fax: [phone]

## 2010-07-20 NOTE — Discharge Summary (Signed)
NAMELIZET, KELSO NO.:  [id number]   MEDICAL RECORD NO.:  [id number]          PATIENT TYPE:  OIB   LOCATION:  6531                         FACILITY:  [facility]   PHYSICIAN:  Vonna Kotyk R. Jacinto Halim, MD       DATE OF BIRTH:  1945-03-11   DATE OF ADMISSION:  07/01/2005  DATE OF DISCHARGE:  07/02/2005                                 DISCHARGE SUMMARY   DISCHARGE DIAGNOSIS:  1.  PFO, status post elective closure this admission.  2.  Previous cerebrovascular accident January of 2007.  3.  History of essential tremors in the past.   HOSPITAL COURSE:  Ms. Bircher is a 65 year old female with no prior  significant cardiovascular history who was admitted to [facility] in  January with a right frontal middle cerebral artery infarct felt to be  embolic.  During her evaluation she was found to have a large PFO with  atrial septal aneurysm.  Transcranial Doppler studies revealed a large right-  to-left shunting.  TEE showed a large atrial septal aneurysm with patent  foramen ovale and positive right-to-left shunting.  She was referred to Dr.  Jacinto Halim for evaluation of PFO closure.  She was admitted for elective closure  on July 01, 2005.  She tolerated this well.  Please see Dr. Verl Dicker  operative note for complete details.  We feel she can be discharged Jul 02, 2005.   DISCHARGE MEDICATIONS:  1.  Coated aspirin daily.  2.  Plavix 75 mg a day.  3.  Lipitor 10 mg a day.  4.  Inderal 10 mg t.i.d.   DISPOSITION:  Patient discharged in stable condition.  She will follow up  with Dr. Reyes Ivan.  She needs an echocardiogram in one week and again in one  month.  Preoperative laboratories showed EKG to be sinus rhythm without  acute changes.  TSH 1.61.  Urinalysis unremarkable.  Sodium 143, potassium  4.3, BUN 113, creatinine 0.8.  White count 11.3, hemoglobin 13.9, hematocrit  46.8, platelets 312.  INR 1.  Chest x-ray done preoperatively showed minimal  linear scarring or atelectasis in  the right middle lobe and left lower lobe.   DISPOSITION:  Patient discharged in stable condition.  She will an  echocardiogram in one week and again one month by her primary cardiologist,  Dr. Reyes Ivan.      Abelino Derrick, P.A.      Cristy Hilts. Jacinto Halim, MD  Electronically Signed    LKK/MEDQ  D:  07/02/2005  T:  07/02/2005  Job:  [id number]   cc:   Elmore Guise., M.D.  Fax: [phone]   Chales Salmon. Abigail Miyamoto, M.D.  Fax: [phone]   Pramod P. Pearlean Brownie, MD  Fax: [phone]

## 2010-07-20 NOTE — Discharge Summary (Signed)
[name], Alvarez                ACCOUNT NO.:  [id number]   MEDICAL RECORD NO.:  [id number]          PATIENT TYPE:  INP   LOCATION:  3017                         FACILITY:  [facility]   PHYSICIAN:  Pramod P. Pearlean Brownie, MD    DATE OF BIRTH:  11/04/1945   DATE OF ADMISSION:  03/15/2005  DATE OF DISCHARGE:  03/19/2005                                 DISCHARGE SUMMARY   ADMISSION DIAGNOSIS:  Stroke.   DISCHARGE DIAGNOSES:  1.  Right frontal middle cerebral artery infarction secondary to embolic      infarction from an unidentified source.  2.  Atrial septal aneurysm and patent foramen ovale.  3.  Hyperlipidemia newly discovered.  4.  Benign essential tremor.   HISTORY OF PRESENT ILLNESS:  Erica Alvarez is a 65 year old lady who was  admitted with sudden onset of slurred speech and left facial weakness and  left hand weakness and clumsiness. She actually woke up at 3  a.m. with some  leg cramps. Subsequently, she took her dog out to walk and when she was  speaking to her dog she noticed her speech was slurred. She tried to brush  her teeth and noticed that water was coming out from the left corner of the  mouth and as well she had some weakness in the left hand and dropped her  cup. Symptoms persisted. She came to the emergency room at [facility]. At  that time, a CT scan of the brain showed hyperdensity in the right frontal  area consistent with old infarct. She was admitted to the stroke unit. She  was kept on telemetry monitoring which did not reveal any cardiac  arrhythmias. Subsequently, MRI scan of the brain was obtained which showed a  right middle cerebral artery branch infarction in the right frontal region.  MRA did not reveal any high-grade vascular stenosis. Carotid ultrasound did  not reveal any major stenosis either. A 2-D echocardiogram showed normal  ejection fraction without any cardiac source of embolism. Subsequently  transesophageal echocardiogram was obtained which showed  atrial septal  aneurysm with a small patent foramen ovale. Lower extremity venous Dopplers  were obtained but did not reveal any evidence of deep vein thrombosis. The  patient's total cholesterol 164m triglycerides 115, HDL 14, LDL was elevated  at 114. Hemoglobin A1c and homocystine were both normal. Hypercoagulable  panel lab was sent and results were pending at the time of discharge. ESR  was normal at 7 mm. ANA was negative. Hepatitis panel labs were pending.  Initially, the patient's liver function enzymes were thought to be elevated,  however, it was later discovered that this was a wrong reading for different  patient charted in the patient's chart. The patient's LFTs in fact were  normal. The patient was started on Aggrenox for secondary stroke prevention.  She initially had a minor headache which resolved with Tylenol. She was  advised to take Aggrenox once a day for two weeks and increase it to twice a  day. I discussed with the patient and her husband the available treatment  option for a patent foramen  ovale and stroke as well as a relationship in  her case not being exactly clear. At the present time, I will recommend  conservative therapy with antiplatelet therapy and do an outpatient  transcranial Doppler . bubble study to gauge the site of the PFO better. The  patient will make a final decision about endovascular PFO closure in the  near future as an outpatient. She was advised to follow-up with her primary  physician, Dr. Henrine Alvarez, in two weeks and with me in about a month.   MEDICATIONS:  At the time of discharge, Aggrenox 1 capsule daily for 2 weeks  to be increased to twice a day, Lipitor 10 milligrams a day, Boniva once a  month, multivitamin once a day, calcium and vitamin D once a day, Inderal 10  milligrams twice a day.           ______________________________  Sunny Schlein. Pearlean Brownie, MD     PPS/MEDQ  D:  03/19/2005  T:  03/19/2005  Job:  [id number]   cc:    Erica Alvarez. Erica Alvarez, M.D.  Fax: [phone]

## 2010-11-26 LAB — I-STAT 8, (EC8 V) (CONVERTED LAB)
BUN: 12
Bicarbonate: 24
Hemoglobin: 15.3 — ABNORMAL HIGH
Operator id: 295021
pCO2, Ven: 34.8 — ABNORMAL LOW
pH, Ven: 7.447 — ABNORMAL HIGH

## 2010-11-26 LAB — POCT CARDIAC MARKERS
CKMB, poc: <1 — ABNORMAL LOW
CKMB, poc: <1 — ABNORMAL LOW
Myoglobin, poc: 44.2
Myoglobin, poc: 46.8
Operator id: 114141
Operator id: 295021
Troponin i, poc: <0.05
Troponin i, poc: <0.05

## 2010-11-26 LAB — BASIC METABOLIC PANEL
BUN: 14
CO2: 27
Chloride: 103
Creatinine, Ser: 0.78
Potassium: 3.8

## 2010-11-26 LAB — DIFFERENTIAL
Basophils Absolute: 0
Lymphocytes Relative: 9 — ABNORMAL LOW
Lymphs Abs: 1.5
Neutro Abs: 16.2 — ABNORMAL HIGH
Neutrophils Relative %: 90 — ABNORMAL HIGH

## 2010-11-26 LAB — CBC
HCT: 39.4
HCT: 41.7
MCV: 89.2
Platelets: 273
Platelets: 278
RBC: 4.42
RDW: 16 — ABNORMAL HIGH
WBC: 16.2 — ABNORMAL HIGH
WBC: 18 — ABNORMAL HIGH

## 2010-11-26 LAB — CARDIAC PANEL(CRET KIN+CKTOT+MB+TROPI)
CK, MB: 1.4
Relative Index: INVALID

## 2010-11-26 LAB — CK TOTAL AND CKMB (NOT AT ARMC): Relative Index: INVALID

## 2010-12-05 LAB — POCT HEMOGLOBIN-HEMACUE: Hemoglobin: 14

## 2011-11-13 ENCOUNTER — Emergency Department (HOSPITAL_BASED_OUTPATIENT_CLINIC_OR_DEPARTMENT_OTHER)
Admission: EM | Admit: 2011-11-13 | Discharge: 2011-11-13 | Disposition: A | Discharge status: Home / Self Care | Payer: 59 | Attending: Emergency Medicine | Admitting: Emergency Medicine

## 2011-11-13 ENCOUNTER — Encounter (HOSPITAL_BASED_OUTPATIENT_CLINIC_OR_DEPARTMENT_OTHER): Payer: Self-pay | Admitting: *Deleted

## 2011-11-13 DIAGNOSIS — M25559 Pain in unspecified hip: Secondary | ICD-10-CM | POA: Insufficient documentation

## 2011-11-13 DIAGNOSIS — Z882 Allergy status to sulfonamides status: Secondary | ICD-10-CM | POA: Insufficient documentation

## 2011-11-13 DIAGNOSIS — Z881 Allergy status to other antibiotic agents status: Secondary | ICD-10-CM | POA: Insufficient documentation

## 2011-11-13 DIAGNOSIS — Z888 Allergy status to other drugs, medicaments and biological substances status: Secondary | ICD-10-CM | POA: Insufficient documentation

## 2011-11-13 DIAGNOSIS — Z880 Allergy status to penicillin: Secondary | ICD-10-CM | POA: Insufficient documentation

## 2011-11-13 HISTORY — DX: Cerebral infarction, unspecified: I63.9

## 2011-11-13 MED ORDER — HYDROMORPHONE HCL PF 1 MG/ML IJ SOLN
1.0000 mg | Freq: Once | INTRAMUSCULAR | Status: AC
  Administered 2011-11-13: 1 mg via INTRAMUSCULAR
  Filled 2011-11-13: qty 1

## 2011-11-13 NOTE — Discharge Instructions (Signed)
Arthralgia Arthralgia is joint pain. A joint is a place where two bones meet. Joint pain can happen for many reasons. The joint can be bruised, stiff, infected, or weak from aging. Pain usually goes away after resting and taking medicine for soreness.  HOME CARE  Rest the joint as told by your doctor.   Keep the sore joint raised (elevated) for the first 24 hours.   Put ice on the joint area.   Put ice in a plastic bag.   Place a towel between your skin and the bag.   Leave the ice on for 15 to 20 minutes, 3 to 4 times a day.   Wear your splint, casting, elastic bandage, or sling as told by your doctor.   Only take medicine as told by your doctor. Do not take aspirin.   Use crutches as told by your doctor. Do not put weight on the joint until told to by your doctor.  GET HELP RIGHT AWAY IF:   You have bruising, puffiness (swelling), or more pain.   Your fingers or toes turn blue or start to lose feeling (numb).   Your medicine does not lessen the pain.   Your pain becomes severe.   You have a temperature by mouth above 102 F (38.9 C), not controlled by medicine.   You cannot move or use the joint.  MAKE SURE YOU:   Understand these instructions.   Will watch your condition.   Will get help right away if you are not doing well or get worse.  Document Released: 02/06/2009 Document Revised: 02/07/2011 Document Reviewed: 02/06/2009 [employer] Patient Information 2012 [employer]. 

## 2011-11-13 NOTE — ED Notes (Addendum)
C/o low back pain and right hip pain for weeks. Pt rec'd shot in hip earlier this week without relief. Pt states she also took robaxin and dilaudid around 5pm at home without relief. Pt is able to ambulate and denies injury. Pt had xrays done at [facility] ortho on Thursday for same.

## 2011-11-13 NOTE — ED Provider Notes (Signed)
History     CSN: [id number]  Arrival date & time 11/13/11  1906   First MD Initiated Contact with Patient 11/13/11 1933      Chief Complaint  Patient presents with  . Back Pain  . Hip Pain     Patient is a 66 y.o. female presenting with hip pain. The history is provided by the patient.  Hip Pain This is a recurrent problem. The current episode started more than 1 week ago. The problem occurs constantly. The problem has been gradually worsening. Pertinent negatives include no chest pain, no abdominal pain and no shortness of breath. The symptoms are aggravated by bending and walking. The symptoms are relieved by rest. Treatments tried: steroid injection, dilaudid. The treatment provided mild relief.  pt presents for hip pain She reports long h/o right hip pain that is intermittent.  She reports this episode started one week ago - no falls/trauma reported.  No leg weakness.  No leg numbness or discoloration No cp/sob.  No abdominal pain.  No urinary/fecal incontinence.  She reports some pain in right buttock as well.  She reports all of this is similar to prior episodes of pain.  She reports after the pain restarted, she had "hip injection" two days ago but no significant relief in pain.  She is also on home dilaudid but no relief.  She reports she called her orthopedist and she was told to come to the ED to "get a shot" She is ambulatory Pt reports she just had xray of her hip performed.  Past Medical History  Diagnosis Date  . Arthritis   . Stroke     Past Surgical History  Procedure Date  . Joint replacement     History reviewed. No pertinent family history.  History  Substance Use Topics  . Smoking status: Never Smoker   . Smokeless tobacco: Not on file  . Alcohol Use: No    OB History    Grav Para Term Preterm Abortions TAB SAB Ect Mult Living                  Review of Systems  Constitutional: Negative for fever.  Respiratory: Negative for shortness of  breath.   Cardiovascular: Negative for chest pain.  Gastrointestinal: Negative for vomiting and abdominal pain.  Musculoskeletal: Negative for gait problem.  Skin: Negative for color change.  Neurological: Negative for dizziness and weakness.  All other systems reviewed and are negative.    Allergies  Methylprednisolone; Penicillins; Sulfonamide derivatives; and Tetracycline  Home Medications  No current outpatient prescriptions on file.  BP 142/66  Pulse 64  Temp 97.6 F (36.4 C) (Oral)  Resp 16  Ht 5' (1.524 m)  Wt 150 lb (68.04 kg)  BMI 29.30 kg/m2  SpO2 98%  Physical Exam CONSTITUTIONAL: Well developed/well nourished HEAD AND FACE: Normocephalic/atraumatic EYES: EOMI/PERRL ENMT: Mucous membranes moist NECK: supple no meningeal signs SPINE:entire spine nontender, No bruising/crepitance/stepoffs noted to spine CV: S1/S2 noted, no murmurs/rubs/gallops noted LUNGS: Lungs are clear to auscultation bilaterally, no apparent distress ABDOMEN: soft, nontender, no rebound or guarding GU:no cva tenderness NEURO: Pt is awake/alert, moves all extremitiesx4.  No focal motor deficits noted in her lower extremities - she can flex/extend right hip and flex/extend right knee and has full ROM of right ankle.  She denies numbness to right LE EXTREMITIES: pulses normal, full ROM.  No erythema/bruising/warmth over right hip.  No skin color changes.  She can actively range the hip fully but does have  tenderness when ranges the hip. SKIN: warm, color normal PSYCH: no abnormalities of mood noted  ED Course  Procedures     1. Hip pain    Pt reports pain similar to previous exacerbations.  I doubt septic joint or occult hip fx (she is ambulatory) She denies any abdominal pain . She has no focal neuro deficits to suggest any sort of myelopathy She will call her pain specialist tomorrow She has home oral dilaudid.  She does not like the robaxin She does report having home valium.  I  advised she could use this for muscle spasm but to avoid mixing with dilaudid due to concern for sedation MDM  Nursing notes including past medical history and social history reviewed and considered in documentation Previous records reviewed and considered - she has previous MR of hip (h/ bursitis) Narcotic database reviewed        Joya Gaskins, MD 11/13/11 2036

## 2011-11-13 NOTE — ED Notes (Signed)
Pt denies any loss of sensation or numbness to right leg

## 2011-12-02 ENCOUNTER — Encounter (HOSPITAL_COMMUNITY): Payer: Self-pay | Admitting: Pharmacy Technician

## 2011-12-03 NOTE — Patient Instructions (Addendum)
20 Erica Alvarez  12/03/2011   Your procedure is scheduled on:  12/10/11 245pm-415pm  Report to [facility] at 1215pm  Call this number if you have problems the morning of surgery: [phone]   Remember:   Do not eat food:After Midnight.  May have clear liquids:until 0800am then npo. .  Clear liquids include soda, tea, black coffee, apple or grape juice, broth.  Take these medicines the morning of surgery with A SIP OF WATER:    Do not wear jewelry, make-up or nail polish.  Do not wear lotions, powders, or perfumes. .  Do not shave 48 hours prior to surgery.   Do not bring valuables to the hospital.  Contacts, dentures or bridgework may not be worn into surgery.  Leave suitcase in the car. After surgery it may be brought to your room.  For patients admitted to the hospital, checkout time is 11:00 AM the day of discharge.               SEE CHG INSTRUCTION SHEET    Please read over the following fact sheets that you were given: MRSA Information, coughing and deep breathing exercises, leg exercises, Blood Transfusion Fact Sheet

## 2011-12-04 ENCOUNTER — Other Ambulatory Visit: Payer: Self-pay | Admitting: Orthopedic Surgery

## 2011-12-04 ENCOUNTER — Encounter (HOSPITAL_COMMUNITY): Payer: Self-pay

## 2011-12-04 ENCOUNTER — Encounter (HOSPITAL_COMMUNITY)
Admission: RE | Admit: 2011-12-04 | Discharge: 2011-12-04 | Disposition: A | Discharge status: Home / Self Care | Payer: 59 | Source: Ambulatory Visit | Attending: Orthopedic Surgery | Admitting: Orthopedic Surgery

## 2011-12-04 HISTORY — DX: Gastro-esophageal reflux disease without esophagitis: K21.9

## 2011-12-04 HISTORY — DX: Malignant (primary) neoplasm, unspecified: C80.1

## 2011-12-04 LAB — BASIC METABOLIC PANEL
BUN: 10 mg/dL (ref 6–23)
Chloride: 99 mEq/L (ref 96–112)
GFR calc Af Amer: >90 mL/min (ref >90)
Potassium: 4.6 mEq/L (ref 3.5–5.1)

## 2011-12-04 LAB — CBC
HCT: 41.1 % (ref 36.0–46.0)
Hemoglobin: 13.4 g/dL (ref 12.0–15.0)
RDW: 14.8 % (ref 11.5–15.5)
WBC: 10.5 10*3/uL (ref 4.0–10.5)

## 2011-12-04 LAB — SURGICAL PCR SCREEN: Staphylococcus aureus: NEGATIVE

## 2011-12-04 LAB — PROTIME-INR: INR: 0.94 (ref 0.00–1.49)

## 2011-12-04 MED ORDER — DEXAMETHASONE SODIUM PHOSPHATE 10 MG/ML IJ SOLN: 10.0000 mg | Freq: Once | INTRAMUSCULAR | Status: DC

## 2011-12-04 MED ORDER — BUPIVACAINE 0.25 % ON-Q PUMP SINGLE CATH 300ML: 300.0000 mL | INJECTION | Status: DC

## 2011-12-04 NOTE — Progress Notes (Signed)
BMET done at time of preop appointment since labs per anesthesia were done.  CMP not done.

## 2011-12-04 NOTE — Progress Notes (Signed)
Preoperative surgical orders have been place into the Epic hospital system for Erica Alvarez on 12/04/2011, 1:16 PM  by Patrica Duel for surgery on 12/10/2011.  Preop Total Knee orders including Bupivacaine On-Q pump, IV Tylenol, and IV Decadron as long as there are no contraindications to the above medications. Avel Peace, PA-C

## 2011-12-05 NOTE — Progress Notes (Signed)
Received fax from office of Dr Lequita Halt stating no change in antibiotics.  Placed on front of chart.

## 2011-12-09 ENCOUNTER — Other Ambulatory Visit: Payer: Self-pay | Admitting: Orthopedic Surgery

## 2011-12-09 NOTE — H&P (Signed)
Erica Alvarez  DOB: 09/21/1945 Married / Language: English / Race: White Female  Date of Admission:  12/10/2011  Chief Complaint:  Left Knee Pain  History of Present Illness The patient is a 65 year old female who comes in for a preoperative History and Physical. The patient is scheduled for a left total knee arthroplasty to be performed by Dr. Frank V. Aluisio, MD at [facility] on 12/10/2011. The patient is a 65 year old female who presented for follow up of their knee. The patient was being followed for their left knee pain. Symptoms reported today include: pain, swelling, locking and giving way. The patient feels that they are doing poorly and report their pain level to be severe. The following medication has been used for pain control: Dilaudid. The patient has reported improvement of their symptoms with: Cortisone injections. She had a cortizone injection 8-13 and states that had only lasted a short while. She also has had injections in the right hip and back recently. She stated the knee was very stiff and doesn't want to move. The patient was last seen back on 8/13. She had a cortisone injection by Dr. Aluisio. She said it did not last long. Her retirement date from work is set for December 2nd then she has a 50th anniversary trip in December for her anniversary and also retirement. The knee has just been aggravating her. The cortisone injection did not last long.  She was trying to hold off the surgery but continues to have pain and is now at a point she feels that she needs to go ahead with the surgery and is now ready to proceed with surgery. They have been treated conservatively in the past for the above stated problem and despite conservative measures, they continue to have progressive pain and severe functional limitations and dysfunction. They have failed non-operative management including home exercise, medications, and injections. It is felt that they would  benefit from undergoing total joint replacement. Risks and benefits of the procedure have been discussed with the patient and they elect to proceed with surgery. There are no active contraindications to surgery such as ongoing infection or rapidly progressive neurological disease.  Problem List/Past Medical Bursitis, hip (726.5) Osteoarthrosis NOS, lower leg (715.96). 01/17/2009 Cerebrovascular Accident. 2007 Blood Clot. Following the left knee scope - DVT Irritable bowel syndrome Pneumonia Hypercholesterolemia Hemorrhoids Skin Cancer. Basal Cell  Allergies Penicillins Sulfanomides Codeine/Codeine Derivatives. Nausea Tetracycline HCl *Tetracyclines** Medrol (Pak) *CORTICOSTEROIDS*. Rapid pulse.   Family History Cancer. father Hypertension. mother Diabetes Mellitus. grandmother mothers side and child Father. Deceased. age 83 Mother. Deceased. age 86   Social History Pain Contract. no Drug/Alcohol Rehab (Previously). no Children. 2 Number of flights of stairs before winded. 1 2-3 Tobacco / smoke exposure. yes no Tobacco use. former smoker; smoke(d) 1/2 pack(s) per day; uses less than half 1/2 can(s) smokeless per week former smoker; smoke(d) less than 1/2 pack(s) per day Living situation. live with spouse Marital status. married Exercise. Exercises weekly; does other Drug/Alcohol Rehab (Currently). no Current work status. working full time Illicit drug use. no Alcohol use. current drinker; drinks wine; only occasionally per week never consumed alcohol Post-Surgical Plans. Plan is to go home.   Medication History Propranolol HCl (10MG Tablet, Oral) Active. ProAir HFA (108 (90 Base)MCG/ACT Aerosol Soln, Inhalation) Active. Dilaudid (2MG Tablet, 1 (one) Tablet Oral 1 Q 4-6HRS PRN PAIN, Taken starting 11/14/2011) Active. Omega 3 (1200MG Capsule, Oral) Active. Aspirin EC (81MG Tablet DR, Oral) Active.     Past Surgical  History Appendectomy Total Hip Replacement. right Arthroscopy of Knee. left bilateral Gallbladder Surgery. laporoscopic Total Knee Replacement. right Hysterectomy. partial (cancerous) partial (non-cancerous) Tonsillectomy Heart Implant for PFO   Review of Systems General:Not Present- Chills, Fever, Night Sweats, Fatigue, Weight Gain, Weight Loss and Memory Loss. Skin:Not Present- Hives, Itching, Rash, Eczema and Lesions. HEENT:Not Present- Tinnitus, Headache, Double Vision, Visual Loss, Hearing Loss and Dentures. Respiratory:Not Present- Shortness of breath with exertion, Shortness of breath at rest, Allergies, Coughing up blood and Chronic Cough. Cardiovascular:Not Present- Chest Pain, Racing/skipping heartbeats, Difficulty Breathing Lying Down, Murmur, Swelling and Palpitations. Gastrointestinal:Not Present- Bloody Stool, Heartburn, Abdominal Pain, Vomiting, Nausea, Constipation, Diarrhea, Difficulty Swallowing, Jaundice and Loss of appetitie. Female Genitourinary:Not Present- Blood in Urine, Urinary frequency, Weak urinary stream, Discharge, Flank Pain, Incontinence, Painful Urination, Urgency, Urinary Retention and Urinating at Night. Musculoskeletal:Present- Muscle Weakness, Joint Swelling, Joint Pain, Back Pain, Morning Stiffness and Spasms. Not Present- Muscle Pain. Neurological:Not Present- Tremor, Dizziness, Blackout spells, Paralysis, Difficulty with balance and Weakness. Psychiatric:Not Present- Insomnia.   Vitals Weight: 160 lb Height: 60 in Weight was reported by patient. Height was reported by patient. Body Surface Area: 1.75 m Body Mass Index: 31.25 kg/m Pulse: 80 (Regular) Resp.: 12 (Unlabored) BP: 148/72 (Sitting, Right Arm, Standard)   Physical Exam The physical exam findings are as follows:   General Mental Status - Alert, cooperative and good historian. General Appearance- pleasant. Not in acute distress. Orientation-  Oriented X3. Build & Nutrition- Well nourished and Well developed.   Head and Neck Head- normocephalic, atraumatic . Neck Global Assessment- supple. no bruit auscultated on the right and no bruit auscultated on the left.   Eye Pupil- Bilateral- Regular and Round. Motion- Bilateral- EOMI. wears glasses  Chest and Lung Exam Auscultation: Breath sounds:- clear at anterior chest wall and - clear at posterior chest wall. Adventitious sounds:- No Adventitious sounds.   Cardiovascular Auscultation:Rhythm- Regular rate and rhythm. Heart Sounds- S1 WNL and S2 WNL. Murmurs & Other Heart Sounds:Auscultation of the heart reveals - No Murmurs.   Abdomen Palpation/Percussion:Tenderness- Abdomen is non-tender to palpation. Rigidity (guarding)- Abdomen is soft. Auscultation:Auscultation of the abdomen reveals - Bowel sounds normal.   Female Genitourinary Not done, not pertinent to present illness  Musculoskeletal Left knee shows range of motion about five to 120 degrees. There is marked crepitus on range of motion. Slight varus. No tenderness or instability. Right knee range of motion is zero to 120 degrees. No swelling or tenderness or instability.  RADIOGRAPHS: Patient's radiographs last visit and AP and lateral show she has bone-on-bone arthritis of the medial compartment and patellofemoral compartment of the left knee. Right knee prosthesis remains in excellent position with no abnormalities.  Assessment & Plan Osteoarthritis, knee (715.96) Impression: Left Knee  Note: Patient is for a Left Total Knee Repalcement by Dr. Aluisio.  Plan is to go home after the hospital stay.  PCP - Dr. Marc Babaoff  Signed electronically by DREW L PERKINS, PA-C 

## 2011-12-10 ENCOUNTER — Encounter (HOSPITAL_COMMUNITY)
Admission: RE | Disposition: A | Discharge status: Home / Self Care | Payer: Self-pay | Source: Ambulatory Visit | Attending: Orthopedic Surgery

## 2011-12-10 ENCOUNTER — Ambulatory Visit (HOSPITAL_COMMUNITY): Payer: 59 | Admitting: Anesthesiology

## 2011-12-10 ENCOUNTER — Encounter (HOSPITAL_COMMUNITY): Payer: Self-pay | Admitting: Anesthesiology

## 2011-12-10 ENCOUNTER — Inpatient Hospital Stay (HOSPITAL_COMMUNITY)
Admission: RE | Admit: 2011-12-10 | Discharge: 2011-12-13 | DRG: 470 | Disposition: A | Discharge status: Home / Self Care | Payer: 59 | Source: Ambulatory Visit | Attending: Orthopedic Surgery | Admitting: Orthopedic Surgery

## 2011-12-10 ENCOUNTER — Encounter (HOSPITAL_COMMUNITY): Payer: Self-pay | Admitting: *Deleted

## 2011-12-10 DIAGNOSIS — Z7982 Long term (current) use of aspirin: Secondary | ICD-10-CM | POA: Diagnosis not present

## 2011-12-10 DIAGNOSIS — Z79899 Other long term (current) drug therapy: Secondary | ICD-10-CM | POA: Diagnosis not present

## 2011-12-10 DIAGNOSIS — E7800 Pure hypercholesterolemia, unspecified: Secondary | ICD-10-CM | POA: Diagnosis present

## 2011-12-10 DIAGNOSIS — Z96659 Presence of unspecified artificial knee joint: Secondary | ICD-10-CM

## 2011-12-10 DIAGNOSIS — Z87891 Personal history of nicotine dependence: Secondary | ICD-10-CM

## 2011-12-10 DIAGNOSIS — K589 Irritable bowel syndrome without diarrhea: Secondary | ICD-10-CM | POA: Diagnosis present

## 2011-12-10 DIAGNOSIS — M76899 Other specified enthesopathies of unspecified lower limb, excluding foot: Secondary | ICD-10-CM | POA: Diagnosis present

## 2011-12-10 DIAGNOSIS — K219 Gastro-esophageal reflux disease without esophagitis: Secondary | ICD-10-CM | POA: Diagnosis present

## 2011-12-10 DIAGNOSIS — M1710 Unilateral primary osteoarthritis, unspecified knee: Secondary | ICD-10-CM | POA: Diagnosis present

## 2011-12-10 DIAGNOSIS — M25569 Pain in unspecified knee: Secondary | ICD-10-CM | POA: Diagnosis not present

## 2011-12-10 DIAGNOSIS — E871 Hypo-osmolality and hyponatremia: Secondary | ICD-10-CM | POA: Diagnosis not present

## 2011-12-10 DIAGNOSIS — D62 Acute posthemorrhagic anemia: Secondary | ICD-10-CM | POA: Diagnosis not present

## 2011-12-10 DIAGNOSIS — Z8673 Personal history of transient ischemic attack (TIA), and cerebral infarction without residual deficits: Secondary | ICD-10-CM | POA: Diagnosis not present

## 2011-12-10 HISTORY — PX: TOTAL KNEE ARTHROPLASTY: SHX125

## 2011-12-10 LAB — TYPE AND SCREEN
ABO/RH(D): B NEG
Antibody Screen: NEGATIVE

## 2011-12-10 SURGERY — ARTHROPLASTY, KNEE, TOTAL
Anesthesia: Spinal | Site: Knee | Laterality: Left | Wound class: Clean

## 2011-12-10 MED ORDER — BUPIVACAINE IN DEXTROSE 0.75-8.25 % IT SOLN
INTRATHECAL | Status: DC | PRN
  Administered 2011-12-10: 1.8 mL via INTRATHECAL

## 2011-12-10 MED ORDER — HYDROMORPHONE HCL PF 1 MG/ML IJ SOLN
0.5000 mg | INTRAMUSCULAR | Status: DC | PRN
  Administered 2011-12-10 (×2): 1 mg via INTRAVENOUS
  Filled 2011-12-10 (×3): qty 1

## 2011-12-10 MED ORDER — 0.9 % SODIUM CHLORIDE (POUR BTL) OPTIME
TOPICAL | Status: DC | PRN
  Administered 2011-12-10: 1000 mL

## 2011-12-10 MED ORDER — ONDANSETRON HCL 4 MG/2ML IJ SOLN
4.0000 mg | Freq: Four times a day (QID) | INTRAMUSCULAR | Status: DC | PRN
  Administered 2011-12-11: 4 mg via INTRAVENOUS
  Filled 2011-12-10: qty 2

## 2011-12-10 MED ORDER — DEXTROSE-NACL 5-0.9 % IV SOLN
INTRAVENOUS | Status: DC
  Administered 2011-12-10: 18:00:00 via INTRAVENOUS

## 2011-12-10 MED ORDER — SODIUM CHLORIDE 0.9 % IV SOLN: INTRAVENOUS | Status: DC

## 2011-12-10 MED ORDER — ACETAMINOPHEN 10 MG/ML IV SOLN
1000.0000 mg | Freq: Four times a day (QID) | INTRAVENOUS | Status: AC
  Administered 2011-12-10 – 2011-12-11 (×4): 1000 mg via INTRAVENOUS
  Filled 2011-12-10 (×5): qty 100

## 2011-12-10 MED ORDER — POLYETHYLENE GLYCOL 3350 17 G PO PACK: 17.0000 g | PACK | Freq: Every day | ORAL | Status: DC | PRN

## 2011-12-10 MED ORDER — ACETAMINOPHEN 10 MG/ML IV SOLN: 1000.0000 mg | Freq: Once | INTRAVENOUS | Status: DC

## 2011-12-10 MED ORDER — FLEET ENEMA 7-19 GM/118ML RE ENEM: 1.0000 | ENEMA | Freq: Once | RECTAL | Status: AC | PRN

## 2011-12-10 MED ORDER — PROPOFOL 10 MG/ML IV EMUL
INTRAVENOUS | Status: DC | PRN
  Administered 2011-12-10: 50 ug/kg/min via INTRAVENOUS

## 2011-12-10 MED ORDER — FENTANYL CITRATE 0.05 MG/ML IJ SOLN
INTRAMUSCULAR | Status: DC | PRN
  Administered 2011-12-10: 50 ug via INTRAVENOUS

## 2011-12-10 MED ORDER — ACETAMINOPHEN 10 MG/ML IV SOLN
INTRAVENOUS | Status: AC
  Filled 2011-12-10: qty 100

## 2011-12-10 MED ORDER — DIAZEPAM 5 MG PO TABS: 2.5000 mg | ORAL_TABLET | ORAL | Status: DC | PRN

## 2011-12-10 MED ORDER — CEFAZOLIN SODIUM-DEXTROSE 2-3 GM-% IV SOLR
INTRAVENOUS | Status: DC | PRN
  Administered 2011-12-10: 2 g via INTRAVENOUS

## 2011-12-10 MED ORDER — MIDAZOLAM HCL 5 MG/5ML IJ SOLN
INTRAMUSCULAR | Status: DC | PRN
  Administered 2011-12-10: 2 mg via INTRAVENOUS

## 2011-12-10 MED ORDER — METHOCARBAMOL 500 MG PO TABS
500.0000 mg | ORAL_TABLET | Freq: Four times a day (QID) | ORAL | Status: DC | PRN
  Administered 2011-12-12 (×3): 500 mg via ORAL
  Filled 2011-12-10 (×4): qty 1

## 2011-12-10 MED ORDER — LACTATED RINGERS IV SOLN
INTRAVENOUS | Status: DC | PRN
  Administered 2011-12-10 (×3): via INTRAVENOUS

## 2011-12-10 MED ORDER — MENTHOL 3 MG MT LOZG: 1.0000 | LOZENGE | OROMUCOSAL | Status: DC | PRN

## 2011-12-10 MED ORDER — CHLORHEXIDINE GLUCONATE 4 % EX LIQD
60.0000 mL | Freq: Once | CUTANEOUS | Status: DC
  Filled 2011-12-10: qty 60

## 2011-12-10 MED ORDER — PROPRANOLOL HCL 10 MG PO TABS
10.0000 mg | ORAL_TABLET | Freq: Two times a day (BID) | ORAL | Status: DC
  Administered 2011-12-10 – 2011-12-13 (×6): 10 mg via ORAL
  Filled 2011-12-10 (×7): qty 1

## 2011-12-10 MED ORDER — HYDROMORPHONE HCL PF 1 MG/ML IJ SOLN: 0.2500 mg | INTRAMUSCULAR | Status: DC | PRN

## 2011-12-10 MED ORDER — CEFAZOLIN SODIUM-DEXTROSE 2-3 GM-% IV SOLR
INTRAVENOUS | Status: AC
  Filled 2011-12-10: qty 50

## 2011-12-10 MED ORDER — METOCLOPRAMIDE HCL 10 MG PO TABS: 5.0000 mg | ORAL_TABLET | Freq: Three times a day (TID) | ORAL | Status: DC | PRN

## 2011-12-10 MED ORDER — ACETAMINOPHEN 10 MG/ML IV SOLN
INTRAVENOUS | Status: DC | PRN
  Administered 2011-12-10: 1000 mg via INTRAVENOUS

## 2011-12-10 MED ORDER — PROMETHAZINE HCL 25 MG/ML IJ SOLN: 6.2500 mg | INTRAMUSCULAR | Status: DC | PRN

## 2011-12-10 MED ORDER — CEFAZOLIN SODIUM-DEXTROSE 2-3 GM-% IV SOLR: 2.0000 g | INTRAVENOUS | Status: DC

## 2011-12-10 MED ORDER — SODIUM CHLORIDE 0.9 % IR SOLN
Status: DC | PRN
  Administered 2011-12-10: 3000 mL

## 2011-12-10 MED ORDER — PHENOL 1.4 % MT LIQD: 1.0000 | OROMUCOSAL | Status: DC | PRN

## 2011-12-10 MED ORDER — BUPIVACAINE ON-Q PAIN PUMP (FOR ORDER SET NO CHG): INJECTION | Status: DC

## 2011-12-10 MED ORDER — ACETAMINOPHEN 325 MG PO TABS: 650.0000 mg | ORAL_TABLET | Freq: Four times a day (QID) | ORAL | Status: DC | PRN

## 2011-12-10 MED ORDER — METOCLOPRAMIDE HCL 5 MG/ML IJ SOLN: 5.0000 mg | Freq: Three times a day (TID) | INTRAMUSCULAR | Status: DC | PRN

## 2011-12-10 MED ORDER — BUPIVACAINE 0.25 % ON-Q PUMP SINGLE CATH 100 ML
INJECTION | Status: DC | PRN
  Administered 2011-12-10: 300 mL

## 2011-12-10 MED ORDER — DOCUSATE SODIUM 100 MG PO CAPS
100.0000 mg | ORAL_CAPSULE | Freq: Two times a day (BID) | ORAL | Status: DC
  Administered 2011-12-10 – 2011-12-13 (×6): 100 mg via ORAL

## 2011-12-10 MED ORDER — METHOCARBAMOL 100 MG/ML IJ SOLN
500.0000 mg | Freq: Four times a day (QID) | INTRAVENOUS | Status: DC | PRN
  Administered 2011-12-10: 500 mg via INTRAVENOUS
  Filled 2011-12-10: qty 5

## 2011-12-10 MED ORDER — CEFAZOLIN SODIUM 1-5 GM-% IV SOLN
1.0000 g | Freq: Four times a day (QID) | INTRAVENOUS | Status: AC
  Administered 2011-12-10 – 2011-12-11 (×2): 1 g via INTRAVENOUS
  Filled 2011-12-10 (×2): qty 50

## 2011-12-10 MED ORDER — ACETAMINOPHEN 650 MG RE SUPP: 650.0000 mg | Freq: Four times a day (QID) | RECTAL | Status: DC | PRN

## 2011-12-10 MED ORDER — BISACODYL 10 MG RE SUPP: 10.0000 mg | Freq: Every day | RECTAL | Status: DC | PRN

## 2011-12-10 MED ORDER — TRAMADOL HCL 50 MG PO TABS: 50.0000 mg | ORAL_TABLET | Freq: Four times a day (QID) | ORAL | Status: DC | PRN

## 2011-12-10 MED ORDER — HYDROMORPHONE HCL 2 MG PO TABS
2.0000 mg | ORAL_TABLET | ORAL | Status: DC | PRN
  Administered 2011-12-10 – 2011-12-11 (×6): 4 mg via ORAL
  Administered 2011-12-12: 2 mg via ORAL
  Administered 2011-12-12 (×3): 4 mg via ORAL
  Administered 2011-12-13 (×2): 2 mg via ORAL
  Filled 2011-12-10: qty 2
  Filled 2011-12-10: qty 26
  Filled 2011-12-10 (×4): qty 2
  Filled 2011-12-10: qty 1
  Filled 2011-12-10 (×2): qty 2
  Filled 2011-12-10: qty 1
  Filled 2011-12-10 (×3): qty 2

## 2011-12-10 MED ORDER — RIVAROXABAN 10 MG PO TABS
10.0000 mg | ORAL_TABLET | Freq: Every day | ORAL | Status: DC
  Administered 2011-12-11 – 2011-12-13 (×3): 10 mg via ORAL
  Filled 2011-12-10 (×5): qty 1

## 2011-12-10 MED ORDER — ONDANSETRON HCL 4 MG PO TABS: 4.0000 mg | ORAL_TABLET | Freq: Four times a day (QID) | ORAL | Status: DC | PRN

## 2011-12-10 MED ORDER — LACTATED RINGERS IV SOLN: INTRAVENOUS | Status: DC

## 2011-12-10 MED ORDER — DIPHENHYDRAMINE HCL 12.5 MG/5ML PO ELIX: 12.5000 mg | ORAL_SOLUTION | ORAL | Status: DC | PRN

## 2011-12-10 MED ORDER — BUPIVACAINE 0.25 % ON-Q PUMP SINGLE CATH 300ML
INJECTION | Status: AC
  Filled 2011-12-10: qty 300

## 2011-12-10 SURGICAL SUPPLY — 57 items
BAG ZIPLOCK 12X15 (MISCELLANEOUS) IMPLANT
BANDAGE ELASTIC 6 VELCRO ST LF (GAUZE/BANDAGES/DRESSINGS) ×2 IMPLANT
BANDAGE ESMARK 6X9 LF (GAUZE/BANDAGES/DRESSINGS) ×1 IMPLANT
BLADE SAG 18X100X1.27 (BLADE) ×2 IMPLANT
BLADE SAW SGTL 11.0X1.19X90.0M (BLADE) ×2 IMPLANT
BNDG ESMARK 6X9 LF (GAUZE/BANDAGES/DRESSINGS) ×2
BOWL SMART MIX CTS (DISPOSABLE) ×2 IMPLANT
CATH KIT ON-Q SILVERSOAK 5IN (CATHETERS) ×2 IMPLANT
CEMENT HV SMART SET (Cement) ×4 IMPLANT
CLOTH BEACON ORANGE TIMEOUT ST (SAFETY) ×2 IMPLANT
CUFF TOURN SGL QUICK 34 (TOURNIQUET CUFF) ×2
CUFF TRNQT CYL 34X4X40X1 (TOURNIQUET CUFF) ×1 IMPLANT
DRAPE EXTREMITY T 121X128X90 (DRAPE) ×2 IMPLANT
DRAPE POUCH INSTRU U-SHP 10X18 (DRAPES) ×2 IMPLANT
DRAPE U-SHAPE 47X51 STRL (DRAPES) ×2 IMPLANT
DRSG ADAPTIC 3X8 NADH LF (GAUZE/BANDAGES/DRESSINGS) ×2 IMPLANT
DRSG PAD ABDOMINAL 8X10 ST (GAUZE/BANDAGES/DRESSINGS) ×2 IMPLANT
DURAPREP 26ML APPLICATOR (WOUND CARE) ×2 IMPLANT
ELECT REM PT RETURN 9FT ADLT (ELECTROSURGICAL) ×2
ELECTRODE REM PT RTRN 9FT ADLT (ELECTROSURGICAL) ×1 IMPLANT
EVACUATOR 1/8 PVC DRAIN (DRAIN) ×2 IMPLANT
FACESHIELD LNG OPTICON STERILE (SAFETY) ×10 IMPLANT
GLOVE BIO SURGEON STRL SZ8 (GLOVE) ×2 IMPLANT
GLOVE BIOGEL PI IND STRL 7.0 (GLOVE) ×2 IMPLANT
GLOVE BIOGEL PI IND STRL 7.5 (GLOVE) ×1 IMPLANT
GLOVE BIOGEL PI IND STRL 8 (GLOVE) ×1 IMPLANT
GLOVE BIOGEL PI INDICATOR 7.0 (GLOVE) ×2
GLOVE BIOGEL PI INDICATOR 7.5 (GLOVE) ×1
GLOVE BIOGEL PI INDICATOR 8 (GLOVE) ×1
GLOVE ECLIPSE 8.0 STRL XLNG CF (GLOVE) ×2 IMPLANT
GLOVE SURG SS PI 6.5 STRL IVOR (GLOVE) ×8 IMPLANT
GLOVE SURG SS PI 7.5 STRL IVOR (GLOVE) ×4 IMPLANT
GOWN STRL NON-REIN LRG LVL3 (GOWN DISPOSABLE) ×4 IMPLANT
GOWN STRL REIN XL XLG (GOWN DISPOSABLE) ×4 IMPLANT
HANDPIECE INTERPULSE COAX TIP (DISPOSABLE) ×1
IMMOBILIZER KNEE 20 (SOFTGOODS) ×2
IMMOBILIZER KNEE 20 THIGH 36 (SOFTGOODS) ×1 IMPLANT
IV NS IRRIG 3000ML ARTHROMATIC (IV SOLUTION) ×2 IMPLANT
KIT BASIN OR (CUSTOM PROCEDURE TRAY) ×2 IMPLANT
MANIFOLD NEPTUNE II (INSTRUMENTS) ×2 IMPLANT
NS IRRIG 1000ML POUR BTL (IV SOLUTION) ×2 IMPLANT
PACK TOTAL JOINT (CUSTOM PROCEDURE TRAY) ×2 IMPLANT
PAD ABD 7.5X8 STRL (GAUZE/BANDAGES/DRESSINGS) ×2 IMPLANT
PADDING CAST COTTON 6X4 STRL (CAST SUPPLIES) ×6 IMPLANT
POSITIONER SURGICAL ARM (MISCELLANEOUS) ×2 IMPLANT
SET HNDPC FAN SPRY TIP SCT (DISPOSABLE) ×1 IMPLANT
SPONGE GAUZE 4X4 12PLY (GAUZE/BANDAGES/DRESSINGS) ×2 IMPLANT
STRIP CLOSURE SKIN 1/2X4 (GAUZE/BANDAGES/DRESSINGS) ×2 IMPLANT
SUCTION FRAZIER 12FR DISP (SUCTIONS) ×2 IMPLANT
SUT MNCRL AB 4-0 PS2 18 (SUTURE) ×2 IMPLANT
SUT VIC AB 2-0 CT1 27 (SUTURE) ×6
SUT VIC AB 2-0 CT1 TAPERPNT 27 (SUTURE) ×3 IMPLANT
SUT VLOC 180 0 24IN GS25 (SUTURE) ×2 IMPLANT
TOWEL OR 17X26 10 PK STRL BLUE (TOWEL DISPOSABLE) ×4 IMPLANT
TRAY FOLEY CATH 14FRSI W/METER (CATHETERS) ×2 IMPLANT
WATER STERILE IRR 1500ML POUR (IV SOLUTION) ×2 IMPLANT
WRAP KNEE MAXI GEL POST OP (GAUZE/BANDAGES/DRESSINGS) ×4 IMPLANT

## 2011-12-10 NOTE — Anesthesia Postprocedure Evaluation (Signed)
Anesthesia Post Note  Patient: Erica Alvarez  Procedure(s) Performed: Procedure(s) (LRB): TOTAL KNEE ARTHROPLASTY (Left)  Anesthesia type: Spinal  Patient location: PACU  Post pain: Pain level controlled  Post assessment: Post-op Vital signs reviewed  Last Vitals:  Filed Vitals:   12/10/11 1730  BP:   Pulse:   Temp: 36.5 C  Resp: 15    Post vital signs: Reviewed  Level of consciousness: sedated  Complications: No apparent anesthesia complications

## 2011-12-10 NOTE — Anesthesia Procedure Notes (Signed)
Spinal  Patient location during procedure: OR Start time: 12/10/2011 3:48 PM End time: 12/10/2011 3:53 PM Staffing Anesthesiologist: Lucille Passy F Performed by: anesthesiologist  Preanesthetic Checklist Completed: patient identified, site marked, surgical consent, pre-op evaluation, timeout performed, IV checked, risks and benefits discussed and monitors and equipment checked Spinal Block Patient position: sitting Prep: Betadine Patient monitoring: heart rate, continuous pulse ox and blood pressure Approach: midline Location: L2-3 Injection technique: single-shot Needle Needle type: Quincke  Needle gauge: 24 G Needle length: 9 cm Additional Notes Expiration date of kit checked and confirmed. Patient tolerated procedure well, without complications. Negative heme/paresthesia Lot [id number] DOE 02/2013

## 2011-12-10 NOTE — Transfer of Care (Signed)
Immediate Anesthesia Transfer of Care Note  Patient: Erica Alvarez  Procedure(s) Performed: Procedure(s) (LRB) with comments: TOTAL KNEE ARTHROPLASTY (Left)  Patient Location: PACU  Anesthesia Type: Regional and Spinal  Level of Consciousness: awake, alert  and oriented  Airway & Oxygen Therapy: Patient Spontanous Breathing and Patient connected to face mask oxygen  Post-op Assessment: Report given to PACU RN and Post -op Vital signs reviewed and stable  Post vital signs: Reviewed and stable  Complications: No apparent anesthesia complications SAB T 10

## 2011-12-10 NOTE — Anesthesia Preprocedure Evaluation (Signed)
Anesthesia Evaluation  Patient identified by MRN, date of birth, ID band Patient awake    Reviewed: Allergy & Precautions, H&P , NPO status , Patient's Chart, lab work & pertinent test results  Airway Mallampati: II TM Distance: >3 FB Neck ROM: Full    Dental No notable dental hx.    Pulmonary neg pulmonary ROS,  breath sounds clear to auscultation  Pulmonary exam normal       Cardiovascular negative cardio ROS  Rhythm:Regular Rate:Normal     Neuro/Psych Anxiety CVA, No Residual Symptoms    GI/Hepatic negative GI ROS, Neg liver ROS,   Endo/Other  negative endocrine ROS  Renal/GU negative Renal ROS  negative genitourinary   Musculoskeletal negative musculoskeletal ROS (+)   Abdominal   Peds negative pediatric ROS (+)  Hematology negative hematology ROS (+)   Anesthesia Other Findings   Reproductive/Obstetrics negative OB ROS                           Anesthesia Physical Anesthesia Plan  ASA: II  Anesthesia Plan: Spinal   Post-op Pain Management:    Induction:   Airway Management Planned: Simple Face Mask  Additional Equipment:   Intra-op Plan:   Post-operative Plan:   Informed Consent: I have reviewed the patients History and Physical, chart, labs and discussed the procedure including the risks, benefits and alternatives for the proposed anesthesia with the patient or authorized representative who has indicated his/her understanding and acceptance.     Plan Discussed with: CRNA and Surgeon  Anesthesia Plan Comments:         Anesthesia Quick Evaluation

## 2011-12-10 NOTE — H&P (View-Only) (Signed)
Erica Alvarez  DOB: 1945-04-09 Married / Language: English / Race: White Female  Date of Admission:  12/10/2011  Chief Complaint:  Left Knee Pain  History of Present Illness The patient is a 66 year old female who comes in for a preoperative History and Physical. The patient is scheduled for a left total knee arthroplasty to be performed by Dr. Gus Rankin. Aluisio, MD at [facility] on 12/10/2011. The patient is a 66 year old female who presented for follow up of their knee. The patient was being followed for their left knee pain. Symptoms reported today include: pain, swelling, locking and giving way. The patient feels that they are doing poorly and report their pain level to be severe. The following medication has been used for pain control: Dilaudid. The patient has reported improvement of their symptoms with: Cortisone injections. She had a cortizone injection 8-13 and states that had only lasted a short while. She also has had injections in the right hip and back recently. She stated the knee was very stiff and doesn't want to move. The patient was last seen back on 8/13. She had a cortisone injection by Dr. Lequita Halt. She said it did not last long. Her retirement date from work is set for December 2nd then she has a 50th anniversary trip in December for her anniversary and also retirement. The knee has just been aggravating her. The cortisone injection did not last long.  She was trying to hold off the surgery but continues to have pain and is now at a point she feels that she needs to go ahead with the surgery and is now ready to proceed with surgery. They have been treated conservatively in the past for the above stated problem and despite conservative measures, they continue to have progressive pain and severe functional limitations and dysfunction. They have failed non-operative management including home exercise, medications, and injections. It is felt that they would  benefit from undergoing total joint replacement. Risks and benefits of the procedure have been discussed with the patient and they elect to proceed with surgery. There are no active contraindications to surgery such as ongoing infection or rapidly progressive neurological disease.  Problem List/Past Medical Bursitis, hip (726.5) Osteoarthrosis NOS, lower leg (715.96). 01/17/2009 Cerebrovascular Accident. 2007 Blood Clot. Following the left knee scope - DVT Irritable bowel syndrome Pneumonia Hypercholesterolemia Hemorrhoids Skin Cancer. Basal Cell  Allergies Penicillins Sulfanomides Codeine/Codeine Derivatives. Nausea Tetracycline HCl *Tetracyclines** Medrol (Pak) *CORTICOSTEROIDS*. Rapid pulse.   Family History Cancer. father Hypertension. mother Diabetes Mellitus. grandmother mothers side and child Father. Deceased. age 64 Mother. Deceased. age 4   Social History Pain Contract. no Drug/Alcohol Rehab (Previously). no Children. 2 Number of flights of stairs before winded. 1 2-3 Tobacco / smoke exposure. yes no Tobacco use. former smoker; smoke(d) 1/2 pack(s) per day; uses less than half 1/2 can(s) smokeless per week former smoker; smoke(d) less than 1/2 pack(s) per day Living situation. live with spouse Marital status. married Exercise. Exercises weekly; does other Drug/Alcohol Rehab (Currently). no Current work status. working full time Illicit drug use. no Alcohol use. current drinker; drinks wine; only occasionally per week never consumed alcohol Post-Surgical Plans. Plan is to go home.   Medication History Propranolol HCl (10MG  Tablet, Oral) Active. ProAir HFA (108 (90 Base)MCG/ACT Aerosol Soln, Inhalation) Active. Dilaudid (2MG  Tablet, 1 (one) Tablet Oral 1 Q 4-6HRS PRN PAIN, Taken starting 11/14/2011) Active. Omega 3 (1200MG  Capsule, Oral) Active. Aspirin EC (81MG  Tablet DR, Oral) Active.  Past Surgical  History Appendectomy Total Hip Replacement. right Arthroscopy of Knee. left bilateral Gallbladder Surgery. laporoscopic Total Knee Replacement. right Hysterectomy. partial (cancerous) partial (non-cancerous) Tonsillectomy Heart Implant for PFO   Review of Systems General:Not Present- Chills, Fever, Night Sweats, Fatigue, Weight Gain, Weight Loss and Memory Loss. Skin:Not Present- Hives, Itching, Rash, Eczema and Lesions. HEENT:Not Present- Tinnitus, Headache, Double Vision, Visual Loss, Hearing Loss and Dentures. Respiratory:Not Present- Shortness of breath with exertion, Shortness of breath at rest, Allergies, Coughing up blood and Chronic Cough. Cardiovascular:Not Present- Chest Pain, Racing/skipping heartbeats, Difficulty Breathing Lying Down, Murmur, Swelling and Palpitations. Gastrointestinal:Not Present- Bloody Stool, Heartburn, Abdominal Pain, Vomiting, Nausea, Constipation, Diarrhea, Difficulty Swallowing, Jaundice and Loss of appetitie. Female Genitourinary:Not Present- Blood in Urine, Urinary frequency, Weak urinary stream, Discharge, Flank Pain, Incontinence, Painful Urination, Urgency, Urinary Retention and Urinating at Night. Musculoskeletal:Present- Muscle Weakness, Joint Swelling, Joint Pain, Back Pain, Morning Stiffness and Spasms. Not Present- Muscle Pain. Neurological:Not Present- Tremor, Dizziness, Blackout spells, Paralysis, Difficulty with balance and Weakness. Psychiatric:Not Present- Insomnia.   Vitals Weight: 160 lb Height: 60 in Weight was reported by patient. Height was reported by patient. Body Surface Area: 1.75 m Body Mass Index: 31.25 kg/m Pulse: 80 (Regular) Resp.: 12 (Unlabored) BP: 148/72 (Sitting, Right Arm, Standard)   Physical Exam The physical exam findings are as follows:   General Mental Status - Alert, cooperative and good historian. General Appearance- pleasant. Not in acute distress. Orientation-  Oriented X3. Build & Nutrition- Well nourished and Well developed.   Head and Neck Head- normocephalic, atraumatic . Neck Global Assessment- supple. no bruit auscultated on the right and no bruit auscultated on the left.   Eye Pupil- Bilateral- Regular and Round. Motion- Bilateral- EOMI. wears glasses  Chest and Lung Exam Auscultation: Breath sounds:- clear at anterior chest wall and - clear at posterior chest wall. Adventitious sounds:- No Adventitious sounds.   Cardiovascular Auscultation:Rhythm- Regular rate and rhythm. Heart Sounds- S1 WNL and S2 WNL. Murmurs & Other Heart Sounds:Auscultation of the heart reveals - No Murmurs.   Abdomen Palpation/Percussion:Tenderness- Abdomen is non-tender to palpation. Rigidity (guarding)- Abdomen is soft. Auscultation:Auscultation of the abdomen reveals - Bowel sounds normal.   Female Genitourinary Not done, not pertinent to present illness  Musculoskeletal Left knee shows range of motion about five to 120 degrees. There is marked crepitus on range of motion. Slight varus. No tenderness or instability. Right knee range of motion is zero to 120 degrees. No swelling or tenderness or instability.  RADIOGRAPHS: Patient's radiographs last visit and AP and lateral show she has bone-on-bone arthritis of the medial compartment and patellofemoral compartment of the left knee. Right knee prosthesis remains in excellent position with no abnormalities.  Assessment & Plan Osteoarthritis, knee (715.96) Impression: Left Knee  Note: Patient is for a Left Total Knee Repalcement by Dr. Lequita Halt.  Plan is to go home after the hospital stay.  PCP - Dr. Berenda Morale  Signed electronically by Roberts Gaudy, PA-C

## 2011-12-10 NOTE — Op Note (Signed)
Pre-operative diagnosis- Osteoarthritis  Left knee(s)  Post-operative diagnosis- Osteoarthritis Left knee(s)  Procedure-  Left  Total Knee Arthroplasty  Surgeon- Gus Rankin. Sarha Bartelt, MD  Assistant- Dimitri Ped, PA-C   Anesthesia-  Spinal EBL-* No blood loss amount entered *  Drains Hemovac  Tourniquet time-  Total Tourniquet Time Documented: Thigh (Left) - 34 minutes   Complications- None  Condition-PACU - hemodynamically stable.   Brief Clinical Note  Erica Alvarez is a 66 y.o. year old female with end stage OA of her left knee with progressively worsening pain and dysfunction. She has constant pain, with activity and at rest and significant functional deficits with difficulties even with ADLs. She has had extensive non-op management including analgesics, injections of cortisone and viscosupplements, and home exercise program, but remains in significant pain with significant dysfunction. Radiographs show bone on bone arthritis medial and patellofemoral. She presents now for left Total Knee Arthroplasty.    Procedure in detail---   The patient is brought into the operating room and positioned supine on the operating table. After successful administration of  Spinal,   a tourniquet is placed high on the  Left thigh(s) and the lower extremity is prepped and draped in the usual sterile fashion. Time out is performed by the operating team and then the  Left lower extremity is wrapped in Esmarch, knee flexed and the tourniquet inflated to 300 mmHg.       A midline incision is made with a ten blade through the subcutaneous tissue to the level of the extensor mechanism. A fresh blade is used to make a medial parapatellar arthrotomy. Soft tissue over the proximal medial tibia is subperiosteally elevated to the joint line with a knife and into the semimembranosus bursa with a Cobb elevator. Soft tissue over the proximal lateral tibia is elevated with attention being paid to avoiding the patellar  tendon on the tibial tubercle. The patella is everted, knee flexed 90 degrees and the ACL and PCL are removed. Findings are bone on bone medial and patellofemoral with large medial osteophytes.        The drill is used to create a starting hole in the distal femur and the canal is thoroughly irrigated with sterile saline to remove the fatty contents. The 5 degree Left  valgus alignment guide is placed into the femoral canal and the distal femoral cutting block is pinned to remove 10 mm off the distal femur. Resection is made with an oscillating saw.      The tibia is subluxed forward and the menisci are removed. The extramedullary alignment guide is placed referencing proximally at the medial aspect of the tibial tubercle and distally along the second metatarsal axis and tibial crest. The block is pinned to remove 2mm off the more deficient medial  side. Resection is made with an oscillating saw. Size 2.5is the most appropriate size for the tibia and the proximal tibia is prepared with the modular drill and keel punch for that size.      The femoral sizing guide is placed and size 2.5 is most appropriate. Rotation is marked off the epicondylar axis and confirmed by creating a rectangular flexion gap at 90 degrees. The size 2.5 cutting block is pinned in this rotation and the anterior, posterior and chamfer cuts are made with the oscillating saw. The intercondylar block is then placed and that cut is made.      Trial size 2.5 tibial component, trial size 2.5 posterior stabilized femur and a 15  mm posterior stabilized rotating platform insert trial is placed. Full extension is achieved with excellent varus/valgus and anterior/posterior balance throughout full range of motion. The patella is everted and thickness measured to be 20  mm. Free hand resection is taken to 12 mm, a 35 template is placed, lug holes are drilled, trial patella is placed, and it tracks normally. Osteophytes are removed off the posterior  femur with the trial in place. All trials are removed and the cut bone surfaces prepared with pulsatile lavage. Cement is mixed and once ready for implantation, the size 2.5 tibial implant, size  2.5 posterior stabilized femoral component, and the size 35 patella are cemented in place and the patella is held with the clamp. The trial insert is placed and the knee held in full extension. All extruded cement is removed and once the cement is hard the permanent 15 mm posterior stabilized rotating platform insert is placed into the tibial tray.      The wound is copiously irrigated with saline solution and the extensor mechanism closed over a hemovac drain with #1 PDS suture. The tourniquet is released for a total tourniquet time of 33  minutes. Flexion against gravity is 140 degrees and the patella tracks normally. Subcutaneous tissue is closed with 2.0 vicryl and subcuticular with running 4.0 Monocryl. The catheter for the Marcaine pain pump is placed and the pump is initiated. The incision is cleaned and dried and steri-strips and a bulky sterile dressing are applied. The limb is placed into a knee immobilizer and the patient is awakened and transported to recovery in stable condition.      Please note that a surgical assistant was a medical necessity for this procedure in order to perform it in a safe and expeditious manner. Surgical assistant was necessary to retract the ligaments and vital neurovascular structures to prevent injury to them and also necessary for proper positioning of the limb to allow for anatomic placement of the prosthesis.   Gus Rankin Erica Medlin, MD    12/10/2011, 4:44 PM

## 2011-12-10 NOTE — Interval H&P Note (Signed)
History and Physical Interval Note:  12/10/2011 2:44 PM  Erica Alvarez  has presented today for surgery, with the diagnosis of Osteoarthritis of the Left Knee  The various methods of treatment have been discussed with the patient and family. After consideration of risks, benefits and other options for treatment, the patient has consented to  Procedure(s) (LRB) with comments: TOTAL KNEE ARTHROPLASTY (Left) as a surgical intervention .  The patient's history has been reviewed, patient examined, no change in status, stable for surgery.  I have reviewed the patient's chart and labs.  Questions were answered to the patient's satisfaction.     Loanne Drilling

## 2011-12-11 ENCOUNTER — Encounter (HOSPITAL_COMMUNITY): Payer: Self-pay | Admitting: Orthopedic Surgery

## 2011-12-11 LAB — BASIC METABOLIC PANEL
CO2: 28 mEq/L (ref 19–32)
Calcium: 8.3 mg/dL — ABNORMAL LOW (ref 8.4–10.5)
Creatinine, Ser: 0.57 mg/dL (ref 0.50–1.10)
Glucose, Bld: 144 mg/dL — ABNORMAL HIGH (ref 70–99)

## 2011-12-11 LAB — CBC
MCH: 29.2 pg (ref 26.0–34.0)
MCHC: 33.1 g/dL (ref 30.0–36.0)
MCV: 88.3 fL (ref 78.0–100.0)
Platelets: 226 10*3/uL (ref 150–400)
RBC: 3.59 MIL/uL — ABNORMAL LOW (ref 3.87–5.11)

## 2011-12-11 MED ORDER — DEXTROSE-NACL 5-0.9 % IV SOLN: INTRAVENOUS | Status: DC

## 2011-12-11 MED ORDER — FLUCONAZOLE 200 MG PO TABS
200.0000 mg | ORAL_TABLET | Freq: Once | ORAL | Status: AC
  Administered 2011-12-11: 200 mg via ORAL
  Filled 2011-12-11: qty 1

## 2011-12-11 NOTE — Progress Notes (Signed)
12/11/11 1300  PT Visit Information  Last PT Received On 12/11/11  Assistance Needed +1  PT Time Calculation  PT Start Time 1124  PT Stop Time 1148  PT Time Calculation (min) 24 min  Subjective Data  Subjective a little nauseous  Precautions  Precautions Knee  Required Braces or Orthoses Knee Immobilizer - Left  Knee Immobilizer - Left Discontinue once straight leg raise with < 10 degree lag  Restrictions  LLE Weight Bearing WBAT  Cognition  Overall Cognitive Status Appears within functional limits for tasks assessed/performed  Arousal/Alertness Awake/alert  Orientation Level Appears intact for tasks assessed  Behavior During Session [facility] for tasks performed  Cognition - Other Comments sleepy  Bed Mobility  Bed Mobility Sit to Supine  Sit to Supine 4: Min assist  Details for Bed Mobility Assistance increased time, assist with LLE and cues for technique  Transfers  Transfers Sit to Stand;Stand to Sit  Sit to Stand 4: Min assist  Stand to Sit 4: Min assist  Details for Transfer Assistance cues for hand placement and LLE position  Ambulation/Gait  Ambulation/Gait Assistance 4: Min assist  Ambulation Distance (Feet) 6 Feet  Assistive device Rolling walker  Ambulation/Gait Assistance Details cues for sequence and RW position  Gait Pattern Step-to pattern;Decreased stance time - left  Gait velocity decreased  Total Joint Exercises  Ankle Circles/Pumps AROM;10 reps;Both  Quad Sets AROM;Both;10 reps  Heel Slides AAROM;Left;10 reps  Straight Leg Raises AROM;Left;10 reps  PT - End of Session  Equipment Utilized During Treatment Left knee immobilizer  Activity Tolerance Patient tolerated treatment well  Patient left in bed;with call bell/phone within reach;with family/visitor present  PT - Assessment/Plan  Comments on Treatment Session pt feeling slightly nausteated, still moving well  PT Plan Discharge plan remains appropriate;Frequency remains appropriate  PT Frequency  7X/week  Follow Up Recommendations Home health PT  Equipment Recommended None recommended by PT  Acute Rehab PT Goals  Time For Goal Achievement 12/16/11  Potential to Achieve Goals Good  Pt will go Supine/Side to Sit with supervision  PT Goal: Supine/Side to Sit - Progress Progressing toward goal  Pt will go Sit to Stand with supervision  PT Goal: Sit to Stand - Progress Progressing toward goal  Pt will go Stand to Sit with supervision  PT Goal: Stand to Sit - Progress Progressing toward goal  Pt will Ambulate with supervision;51 - 150 feet;with rolling walker  PT Goal: Ambulate - Progress Progressing toward goal  Pt will Perform Home Exercise Program with supervision, verbal cues required/provided  PT Goal: Perform Home Exercise Program - Progress Progressing toward goal  PT General Charges  $$ ACUTE PT VISIT 1 Procedure  PT Treatments  $Therapeutic Exercise 8-22 mins  $Therapeutic Activity 8-22 mins

## 2011-12-11 NOTE — Evaluation (Signed)
Physical Therapy Evaluation Patient Details Name: Erica Alvarez MRN: [id number] DOB: September 27, 1945 Today's Date: 12/11/2011 Time: 0956-1020 PT Time Calculation (min): 24 min  PT Assessment / Plan / Recommendation Clinical Impression  pt si s/p left TKA and willbenfit from PT to maximize independence for home setting with family assist     PT Assessment  Patient needs continued PT services    Follow Up Recommendations  Home health PT    Does the patient have the potential to tolerate intense rehabilitation      Barriers to Discharge        Equipment Recommendations  None recommended by PT    Recommendations for Other Services     Frequency 7X/week    Precautions / Restrictions Precautions Precautions: Knee Precaution Comments: no pillow under knee Required Braces or Orthoses: Knee Immobilizer - Left Restrictions Weight Bearing Restrictions: No LLE Weight Bearing: Weight bearing as tolerated   Pertinent Vitals/Pain      Mobility  Bed Mobility Bed Mobility: Supine to Sit Supine to Sit: 4: Min assist Details for Bed Mobility Assistance: increased time, assist with LLE and cues for technique Transfers Transfers: Sit to Stand;Stand to Sit Sit to Stand: 4: Min guard;From bed Stand to Sit: 4: Min guard;To chair/3-in-1 Details for Transfer Assistance: cues for hand placement and LLE position Ambulation/Gait Ambulation/Gait Assistance: 4: Min assist Ambulation Distance (Feet): 30 Feet Assistive device: Rolling walker Ambulation/Gait Assistance Details: cues for sequence and RW position Gait Pattern: Step-to pattern;Decreased stance time - left Gait velocity: decreased    Shoulder Instructions     Exercises Total Joint Exercises Ankle Circles/Pumps: AROM;10 reps;Both   PT Diagnosis: Difficulty walking  PT Problem List: Decreased strength;Decreased range of motion;Decreased activity tolerance;Decreased balance;Decreased mobility;Decreased safety awareness;Decreased  knowledge of use of DME PT Treatment Interventions: DME instruction;Gait training;Stair training;Functional mobility training;Therapeutic activities;Therapeutic exercise;Patient/family education   PT Goals Acute Rehab PT Goals PT Goal Formulation: With patient Time For Goal Achievement: 12/16/11 Potential to Achieve Goals: Good Pt will go Supine/Side to Sit: with supervision PT Goal: Supine/Side to Sit - Progress: Goal set today Pt will go Sit to Stand: with supervision PT Goal: Sit to Stand - Progress: Goal set today Pt will go Stand to Sit: with supervision PT Goal: Stand to Sit - Progress: Goal set today Pt will Ambulate: with supervision;51 - 150 feet;with rolling walker PT Goal: Ambulate - Progress: Goal set today Pt will Go Up / Down Stairs: 3-5 stairs;with least restrictive assistive device PT Goal: Up/Down Stairs - Progress: Goal set today Pt will Perform Home Exercise Program: with supervision, verbal cues required/provided PT Goal: Perform Home Exercise Program - Progress: Goal set today  Visit Information  Last PT Received On: 12/11/11 Assistance Needed: +1    Subjective Data  Subjective: you have to get your pain meds Patient Stated Goal: home   Prior Functioning  Home Living Lives With: Spouse (sister in law) Available Help at Discharge: Family Type of Home: House Home Access: Stairs to enter Secretary/administrator of Steps: 3 Entrance Stairs-Rails: None Home Layout: Able to live on main level with bedroom/bathroom Bathroom Shower/Tub: Tub/shower unit Home Adaptive Equipment: Walker - rolling;Bedside commode/3-in-1;Shower chair with back;Hand-held shower hose    Cognition  Overall Cognitive Status: Appears within functional limits for tasks assessed/performed Arousal/Alertness: Awake/alert Orientation Level: Appears intact for tasks assessed Behavior During Session: [facility] for tasks performed    Extremity/Trunk Assessment Right Upper Extremity Assessment RUE  ROM/Strength/Tone: [facility] for tasks assessed Left Upper Extremity Assessment LUE  ROM/Strength/Tone: [facility] for tasks assessed Right Lower Extremity Assessment RLE ROM/Strength/Tone: [facility] for tasks assessed Left Lower Extremity Assessment LLE ROM/Strength/Tone: Deficits;Due to pain LLE ROM/Strength/Tone Deficits: ankle WFL;  able to assist with SLR;   Balance    End of Session PT - End of Session Equipment Utilized During Treatment: Left knee immobilizer Activity Tolerance: Patient tolerated treatment well Patient left: in chair;with call bell/phone within reach;with family/visitor present  GP     [facility] 12/11/2011, 10:26 AM

## 2011-12-11 NOTE — Progress Notes (Signed)
Patient request foley to stay in today, has incontinence issues.d perkins pa notified with orders received  D Susann Givens RN

## 2011-12-11 NOTE — Progress Notes (Signed)
   Subjective: 1 Day Post-Op Procedure(s) (LRB): TOTAL KNEE ARTHROPLASTY (Left) Patient reports pain as mild and moderate.   Patient seen in rounds with Dr. Lequita Halt. Patient is well, and has had no acute complaints or problems We will start therapy today.  Plan is to go Home after hospital stay.  Objective: Vital signs in last 24 hours: Temp:  [96.1 F (35.6 C)-99.6 F (37.6 C)] 99.2 F (37.3 C) (10/09 1358) Pulse Rate:  [58-78] 78  (10/09 1358) Resp:  [15-16] 16  (10/09 1358) BP: (114-138)/(69-81) 128/72 mmHg (10/09 1358) SpO2:  [92 %-99 %] 95 % (10/09 1358) Weight:  [73.71 kg (162 lb 8 oz)] 73.71 kg (162 lb 8 oz) (10/09 0515)  Intake/Output from previous day:  Intake/Output Summary (Last 24 hours) at 12/11/11 1718 Last data filed at 12/11/11 1300  Gross per 24 hour  Intake 2640.75 ml  Output   2615 ml  Net  25.75 ml    Intake/Output this shift: Total I/O In: 863.3 [P.O.:480; I.V.:281.3; IV Piggyback:102] Out: 700 [Urine:700]  Labs:  [facility] 12/11/11 0352  HGB 10.5*    Basename 12/11/11 0352  WBC 9.6  RBC 3.59*  HCT 31.7*  PLT 226    Basename 12/11/11 0352  NA 137  K 3.6  CL 103  CO2 28  BUN 8  CREATININE 0.57  GLUCOSE 144*  CALCIUM 8.3*   No results found for this basename: LABPT:2,INR:2 in the last 72 hours  EXAM General - Patient is Alert, Appropriate and Oriented Extremity - Neurovascular intact Sensation intact distally Dorsiflexion/Plantar flexion intact Dressing - moderate drainage on the dressing but the incision looks good. Motor Function - intact, moving foot and toes well on exam.  Hemovac pulled without difficulty.  Past Medical History  Diagnosis Date  . Arthritis   . Stroke     2007 - no deficits per patient  . GERD (gastroesophageal reflux disease)   . Cancer     basal cell cancer on nose   . Neuromuscular disorder     benign essential tremors - reason for Inderal     Assessment/Plan: 1 Day Post-Op Procedure(s)  (LRB): TOTAL KNEE ARTHROPLASTY (Left) Principal Problem:  *OA (osteoarthritis) of knee  Estimated Body mass index is 31.74 kg/(m^2) as calculated from the following:   Height as of this encounter: 5\' 0" (1.524 m).   Weight as of this encounter: 162 lb 8 oz(73.71 kg). Advance diet Up with therapy Discharge home with home health  DVT Prophylaxis - Xarelto Weight-Bearing as tolerated to left leg No vaccines. D/C O2 and Pulse OX and try on Room [address]  Patrica Duel 12/11/2011, 5:18 PM

## 2011-12-11 NOTE — Progress Notes (Signed)
OT Note:  PT spoke to pt about OT and no needs were identified.  Pt has had previous knee surgery.  Screen only. Marica Otter, OTR/L [phone] 12/11/2011

## 2011-12-11 NOTE — Progress Notes (Signed)
Utilization review completed.  

## 2011-12-12 DIAGNOSIS — D62 Acute posthemorrhagic anemia: Secondary | ICD-10-CM | POA: Diagnosis not present

## 2011-12-12 DIAGNOSIS — E871 Hypo-osmolality and hyponatremia: Secondary | ICD-10-CM | POA: Diagnosis not present

## 2011-12-12 LAB — CBC
MCH: 29.2 pg (ref 26.0–34.0)
MCV: 88.7 fL (ref 78.0–100.0)
Platelets: 228 10*3/uL (ref 150–400)
RDW: 14 % (ref 11.5–15.5)
WBC: 11.5 10*3/uL — ABNORMAL HIGH (ref 4.0–10.5)

## 2011-12-12 LAB — BASIC METABOLIC PANEL
Calcium: 8.5 mg/dL (ref 8.4–10.5)
Creatinine, Ser: 0.68 mg/dL (ref 0.50–1.10)
GFR calc Af Amer: >90 mL/min (ref >90)

## 2011-12-12 MED ORDER — RIVAROXABAN 10 MG PO TABS: 10.0000 mg | ORAL_TABLET | Freq: Every day | ORAL | Status: DC

## 2011-12-12 MED ORDER — METHOCARBAMOL 500 MG PO TABS: 500.0000 mg | ORAL_TABLET | Freq: Four times a day (QID) | ORAL | Status: DC | PRN

## 2011-12-12 MED ORDER — HYDROMORPHONE HCL 2 MG PO TABS: 2.0000 mg | ORAL_TABLET | ORAL | Status: DC | PRN

## 2011-12-12 NOTE — Progress Notes (Signed)
12/12/11 1300  PT Visit Information  Last PT Received On 12/12/11  Assistance Needed +1  PT Time Calculation  PT Start Time 1305  PT Stop Time 1333  PT Time Calculation (min) 28 min  Subjective Data  Subjective I am tired  Precautions  Precautions Knee  Precaution Comments no pillow under knee  Required Braces or Orthoses Knee Immobilizer - Left  Knee Immobilizer - Left Discontinue once straight leg raise with < 10 degree lag  Restrictions  LLE Weight Bearing WBAT  Cognition  Overall Cognitive Status Appears within functional limits for tasks assessed/performed  Arousal/Alertness Awake/alert  Orientation Level Appears intact for tasks assessed  Behavior During Session [facility] for tasks performed  Bed Mobility  Bed Mobility Sit to Supine  Sit to Supine 4: Min assist  Details for Bed Mobility Assistance increased time, assist with LLE and cues for technique  Transfers  Transfers Sit to Stand;Stand to Sit;Stand Pivot Transfers  Sit to Stand 4: Min guard  Stand to Sit 4: Min guard  Stand Pivot Transfers 4: Min assist  Details for Transfer Assistance cues for hand placement and LLE position  Ambulation/Gait  Ambulation/Gait Assistance 4: Min guard  Ambulation Distance (Feet) 5 Feet  Assistive device Rolling walker  Ambulation/Gait Assistance Details cues for sequence and RW distance from self  Gait Pattern Step-to pattern;Decreased stance time - left;Decreased stance time - right  Total Joint Exercises  Ankle Circles/Pumps AROM;10 reps;Both  PT - End of Session  Equipment Utilized During Treatment Left knee immobilizer  Activity Tolerance Patient tolerated treatment well  Patient left in chair;with call bell/phone within reach;with family/visitor present  PT - Assessment/Plan  Comments on Treatment Session pt feeling tired and slightly nauseated; happy that she was able to void this pm, RN notified; doing well but requires increased time for all activities; stairs in am  PT Plan  Discharge plan remains appropriate;Frequency remains appropriate  PT Frequency 7X/week  Follow Up Recommendations Home health PT  Equipment Recommended None recommended by PT  Acute Rehab PT Goals  PT Goal Formulation With patient  Time For Goal Achievement 12/16/11  Potential to Achieve Goals Good  Pt will go Sit to Stand with supervision  PT Goal: Sit to Stand - Progress Progressing toward goal  Pt will go Stand to Sit with supervision  PT Goal: Stand to Sit - Progress Progressing toward goal  Pt will Perform Home Exercise Program with supervision, verbal cues required/provided  PT Goal: Perform Home Exercise Program - Progress Progressing toward goal  PT General Charges  $$ ACUTE PT VISIT 1 Procedure  PT Treatments  $Therapeutic Activity 23-37 mins

## 2011-12-12 NOTE — Progress Notes (Signed)
Physical Therapy Treatment Patient Details Name: Erica Alvarez MRN: [id number] DOB: Apr 28, 1945 Today's Date: 12/12/2011 Time: 4098-1191 PT Time Calculation (min): 40 min  PT Assessment / Plan / Recommendation Comments on Treatment Session  pt progressing well; late surgery on Tues and doesn't feel ready to D/C home today;    Follow Up Recommendations  Home health PT     Does the patient have the potential to tolerate intense rehabilitation     Barriers to Discharge        Equipment Recommendations  None recommended by PT    Recommendations for Other Services    Frequency 7X/week   Plan Discharge plan remains appropriate;Frequency remains appropriate    Precautions / Restrictions Precautions Precautions: Knee Precaution Comments: no pillow under knee Required Braces or Orthoses: Knee Immobilizer - Left Knee Immobilizer - Left: Discontinue once straight leg raise with < 10 degree lag Restrictions LLE Weight Bearing: Weight bearing as tolerated   Pertinent Vitals/Pain     Mobility  Bed Mobility Bed Mobility: Sit to Supine Supine to Sit: 4: Min assist Details for Bed Mobility Assistance: increased time, assist with LLE and cues for technique Transfers Transfers: Sit to Stand;Stand to Sit Sit to Stand: 4: Min guard Stand to Sit: 4: Min guard Details for Transfer Assistance: cues for hand placement and LLE position Ambulation/Gait Ambulation/Gait Assistance: 4: Min guard Ambulation Distance (Feet): 160 Feet Assistive device: Rolling walker Ambulation/Gait Assistance Details: cues for sequence and RW distance from self Gait Pattern: Step-to pattern;Decreased stance time - left;Decreased stance time - right Gait velocity: decreased    Exercises Total Joint Exercises Ankle Circles/Pumps: AROM;10 reps;Both Heel Slides: AAROM;Left;10 reps Hip ABduction/ADduction: AAROM;Left;10 reps Straight Leg Raises: AROM;Left;10 reps   PT Diagnosis:    PT Problem List:   PT  Treatment Interventions:     PT Goals Acute Rehab PT Goals Time For Goal Achievement: 12/16/11 Potential to Achieve Goals: Good Pt will go Supine/Side to Sit: with supervision PT Goal: Supine/Side to Sit - Progress: Progressing toward goal Pt will go Sit to Stand: with supervision PT Goal: Sit to Stand - Progress: Progressing toward goal Pt will go Stand to Sit: with supervision PT Goal: Stand to Sit - Progress: Progressing toward goal Pt will Ambulate: with supervision;51 - 150 feet;with rolling walker PT Goal: Ambulate - Progress: Progressing toward goal Pt will Perform Home Exercise Program: with supervision, verbal cues required/provided PT Goal: Perform Home Exercise Program - Progress: Progressing toward goal  Visit Information  Last PT Received On: 12/12/11    Subjective Data  Subjective: I am better but last night was rough Patient Stated Goal: independent as possible   Cognition  Overall Cognitive Status: Appears within functional limits for tasks assessed/performed Arousal/Alertness: Awake/alert Orientation Level: Appears intact for tasks assessed Behavior During Session: [facility] for tasks performed    Balance     End of Session PT - End of Session Equipment Utilized During Treatment: Left knee immobilizer Activity Tolerance: Patient tolerated treatment well Patient left: in chair;with call bell/phone within reach;with family/visitor present   GP     [facility] 12/12/2011, 12:14 PM

## 2011-12-12 NOTE — Progress Notes (Signed)
   Subjective: 2 Days Post-Op Procedure(s) (LRB): TOTAL KNEE ARTHROPLASTY (Left) Patient reports pain as mild.   Patient seen in rounds with Dr. Lequita Halt. Patient is well, and has had no acute complaints or problems Patient is ready to go home if she does well with both therapy sesions today.  Objective: Vital signs in last 24 hours: Temp:  [97.2 F (36.2 C)-99.9 F (37.7 C)] 99.9 F (37.7 C) (10/10 0532) Pulse Rate:  [72-87] 87  (10/10 0926) Resp:  [14-16] 14  (10/10 0532) BP: (126-137)/(72-84) 126/82 mmHg (10/10 0926) SpO2:  [91 %-95 %] 91 % (10/10 0532)  Intake/Output from previous day:  Intake/Output Summary (Last 24 hours) at 12/12/11 1140 Last data filed at 12/12/11 0532  Gross per 24 hour  Intake    482 ml  Output   3125 ml  Net  -2643 ml    Intake/Output this shift:    Labs:  Basename 12/12/11 0442 12/11/11 0352  HGB 10.3* 10.5*    Basename 12/12/11 0442 12/11/11 0352  WBC 11.5* 9.6  RBC 3.53* 3.59*  HCT 31.3* 31.7*  PLT 228 226    Basename 12/12/11 0442 12/11/11 0352  NA 134* 137  K 3.7 3.6  CL 100 103  CO2 27 28  BUN 6 8  CREATININE 0.68 0.57  GLUCOSE 117* 144*  CALCIUM 8.5 8.3*   No results found for this basename: LABPT:2,INR:2 in the last 72 hours  EXAM: General - Patient is Alert, Appropriate and Oriented Extremity - Neurovascular intact Sensation intact distally Dorsiflexion/Plantar flexion intact No cellulitis present Incision - clean, dry, no drainage, healing Motor Function - intact, moving foot and toes well on exam.   Assessment/Plan: 2 Days Post-Op Procedure(s) (LRB): TOTAL KNEE ARTHROPLASTY (Left) Procedure(s) (LRB): TOTAL KNEE ARTHROPLASTY (Left) Past Medical History  Diagnosis Date  . Arthritis   . Stroke     2007 - no deficits per patient  . GERD (gastroesophageal reflux disease)   . Cancer     basal cell cancer on nose   . Neuromuscular disorder     benign essential tremors - reason for Inderal    Principal  Problem:  *OA (osteoarthritis) of knee Active Problems:  Postop Acute blood loss anemia  Postop Hyponatremia  Estimated Body mass index is 31.74 kg/(m^2) as calculated from the following:   Height as of this encounter: 5\' 0" (1.524 m).   Weight as of this encounter: 162 lb 8 oz(73.71 kg). Up with therapy Discharge home with home health if she does well with both sessions of therapy today.  Will have therapist call if she meets all of her goals. Diet - Cardiac diet Follow up - in 2 weeks Activity - WBAT Disposition - Home Condition Upon Discharge - pending, awaiting therapy sessions today. D/C Meds - See DC Summary DVT Prophylaxis - Xarelto  Walther Sanagustin 12/12/2011, 11:40 AM

## 2011-12-13 LAB — CBC
Platelets: 263 10*3/uL (ref 150–400)
RDW: 13.8 % (ref 11.5–15.5)
WBC: 15.7 10*3/uL — ABNORMAL HIGH (ref 4.0–10.5)

## 2011-12-13 NOTE — Progress Notes (Signed)
   Subjective: 3 Days Post-Op Procedure(s) (LRB): TOTAL KNEE ARTHROPLASTY (Left) Patient reports pain as mild.   Patient seen in rounds with Dr. Lequita Halt.  She was extremely tired after yesterday morning session.  She feels better this morning. Patient is well, and has had no acute complaints or problems Patient is ready to go home later today.  Objective: Vital signs in last 24 hours: Temp:  [99.5 F (37.5 C)-99.9 F (37.7 C)] 99.5 F (37.5 C) (10/11 0452) Pulse Rate:  [83-89] 84  (10/11 0452) Resp:  [14-16] 16  (10/11 0452) BP: (108-137)/(71-83) 108/71 mmHg (10/11 0452) SpO2:  [92 %-95 %] 94 % (10/11 0452)  Intake/Output from previous day:  Intake/Output Summary (Last 24 hours) at 12/13/11 0857 Last data filed at 12/13/11 0700  Gross per 24 hour  Intake    960 ml  Output   1500 ml  Net   -540 ml    Intake/Output this shift:    Labs:  Basename 12/13/11 0345 12/12/11 0442 12/11/11 0352  HGB 10.2* 10.3* 10.5*    Basename 12/13/11 0345 12/12/11 0442  WBC 15.7* 11.5*  RBC 3.49* 3.53*  HCT 30.5* 31.3*  PLT 263 228    Basename 12/12/11 0442 12/11/11 0352  NA 134* 137  K 3.7 3.6  CL 100 103  CO2 27 28  BUN 6 8  CREATININE 0.68 0.57  GLUCOSE 117* 144*  CALCIUM 8.5 8.3*   No results found for this basename: LABPT:2,INR:2 in the last 72 hours  EXAM: General - Patient is Alert, Appropriate and Oriented Extremity - Neurovascular intact Sensation intact distally Dorsiflexion/Plantar flexion intact No cellulitis present Incision - clean, dry, no drainage, healing, some bruising medial knee Motor Function - intact, moving foot and toes well on exam.   Assessment/Plan: 3 Days Post-Op Procedure(s) (LRB): TOTAL KNEE ARTHROPLASTY (Left) Procedure(s) (LRB): TOTAL KNEE ARTHROPLASTY (Left) Past Medical History  Diagnosis Date  . Arthritis   . Stroke     2007 - no deficits per patient  . GERD (gastroesophageal reflux disease)   . Cancer     basal cell cancer on  nose   . Neuromuscular disorder     benign essential tremors - reason for Inderal    Principal Problem:  *OA (osteoarthritis) of knee Active Problems:  Postop Acute blood loss anemia  Postop Hyponatremia  Estimated Body mass index is 31.74 kg/(m^2) as calculated from the following:   Height as of this encounter: 5\' 0" (1.524 m).   Weight as of this encounter: 162 lb 8 oz(73.71 kg). Discharge home with home health Diet - Cardiac diet Follow up - in 2 weeks Activity - WBAT Disposition - Home Condition Upon Discharge - Good D/C Meds - See DC Summary DVT Prophylaxis - Xarelto  Erica Alvarez 12/13/2011, 8:57 AM

## 2011-12-13 NOTE — Progress Notes (Signed)
Pt and ot set up with pt preference of [facility].

## 2011-12-13 NOTE — Progress Notes (Signed)
Physical Therapy Treatment Patient Details Name: Erica Alvarez MRN: [id number] DOB: 1946/02/02 Today's Date: 12/13/2011 Time: 3086-5784 PT Time Calculation (min): 25 min  PT Assessment / Plan / Recommendation Comments on Treatment Session  POD #3 L TKR, pt plans to D/C to home with family support today.  Instructed and demon on stairs, instructed on use of ICE, instructed on KI use for amb and stairs, advised to use + 2 assist with steps for increased safety and given handout on stairs.    Follow Up Recommendations  No PT follow up     Does the patient have the potential to tolerate intense rehabilitation     Barriers to Discharge        Equipment Recommendations   (has a RW from prior TKR)    Recommendations for Other Services    Frequency 7X/week   Plan Discharge plan remains appropriate    Precautions / Restrictions Precautions Precautions: Knee Precaution Comments: Instructed pt and family on KI use for amb and stairs Required Braces or Orthoses: Knee Immobilizer - Left Knee Immobilizer - Left: Discontinue once straight leg raise with < 10 degree lag Restrictions Weight Bearing Restrictions: No LLE Weight Bearing: Weight bearing as tolerated    Pertinent Vitals/Pain C/o "soreness" ICE applied    Mobility  Bed Mobility Bed Mobility: Supine to Sit;Sitting - Scoot to Edge of Bed Supine to Sit: 5: Supervision;4: Min guard Sitting - Scoot to [place] of Bed: 5: Supervision;4: Min guard Details for Bed Mobility Assistance: increased time  Transfers Transfers: Sit to Stand;Stand to Sit Sit to Stand: From bed;From toilet Stand to Sit: To chair/3-in-1;To toilet Details for Transfer Assistance: 25% VC's on proper tech and hand placement  Ambulation/Gait Ambulation/Gait Assistance: 4: Min guard;5: Supervision Ambulation Distance (Feet): 65 Feet Assistive device: Rolling walker Ambulation/Gait Assistance Details: <25% VC's on proper walker to self distance and safety  with turns Gait Pattern: Step-to pattern;Decreased stance time - left;Trunk flexed Gait velocity: decreased  Stairs: Yes Stairs Assistance: 4: Min assist Stairs Assistance Details (indicate cue type and reason): practiced up one step forward with RW and up 2 steps backward with RW.  Pt required 50% VC's and given handout.  Family present and also educated/demonstrated. Advised family pt would need + 2 assist with stairs for extra safety. Stair Management Technique: No rails Number of Stairs: 3  (one step & two steps)    PT Goals              progressing    Visit Information  Last PT Received On: 12/13/11 Assistance Needed: +1    Subjective Data      Cognition    slightly impaired   Balance   fair  End of Session PT - End of Session Equipment Utilized During Treatment: Gait belt;Left knee immobilizer Activity Tolerance: Patient tolerated treatment well Patient left: in chair;with call bell/phone within reach;with family/visitor present (ICE L knee)  Erica Alvarez  PTA [facility]  Acute  Rehab Pager     [phone]

## 2011-12-14 DIAGNOSIS — K589 Irritable bowel syndrome without diarrhea: Secondary | ICD-10-CM | POA: Diagnosis not present

## 2011-12-14 DIAGNOSIS — R269 Unspecified abnormalities of gait and mobility: Secondary | ICD-10-CM | POA: Diagnosis not present

## 2011-12-14 DIAGNOSIS — Z471 Aftercare following joint replacement surgery: Secondary | ICD-10-CM | POA: Diagnosis not present

## 2011-12-14 DIAGNOSIS — Z96659 Presence of unspecified artificial knee joint: Secondary | ICD-10-CM | POA: Diagnosis not present

## 2011-12-14 DIAGNOSIS — G8918 Other acute postprocedural pain: Secondary | ICD-10-CM | POA: Diagnosis not present

## 2011-12-14 DIAGNOSIS — IMO0001 Reserved for inherently not codable concepts without codable children: Secondary | ICD-10-CM | POA: Diagnosis not present

## 2011-12-16 DIAGNOSIS — Z471 Aftercare following joint replacement surgery: Secondary | ICD-10-CM | POA: Diagnosis not present

## 2011-12-16 DIAGNOSIS — K589 Irritable bowel syndrome without diarrhea: Secondary | ICD-10-CM | POA: Diagnosis not present

## 2011-12-16 DIAGNOSIS — G8918 Other acute postprocedural pain: Secondary | ICD-10-CM | POA: Diagnosis not present

## 2011-12-16 DIAGNOSIS — IMO0001 Reserved for inherently not codable concepts without codable children: Secondary | ICD-10-CM | POA: Diagnosis not present

## 2011-12-16 DIAGNOSIS — Z96659 Presence of unspecified artificial knee joint: Secondary | ICD-10-CM | POA: Diagnosis not present

## 2011-12-16 DIAGNOSIS — R269 Unspecified abnormalities of gait and mobility: Secondary | ICD-10-CM | POA: Diagnosis not present

## 2011-12-17 DIAGNOSIS — Z96659 Presence of unspecified artificial knee joint: Secondary | ICD-10-CM | POA: Diagnosis not present

## 2011-12-17 DIAGNOSIS — G8918 Other acute postprocedural pain: Secondary | ICD-10-CM | POA: Diagnosis not present

## 2011-12-17 DIAGNOSIS — K589 Irritable bowel syndrome without diarrhea: Secondary | ICD-10-CM | POA: Diagnosis not present

## 2011-12-17 DIAGNOSIS — Z471 Aftercare following joint replacement surgery: Secondary | ICD-10-CM | POA: Diagnosis not present

## 2011-12-17 DIAGNOSIS — IMO0001 Reserved for inherently not codable concepts without codable children: Secondary | ICD-10-CM | POA: Diagnosis not present

## 2011-12-17 DIAGNOSIS — R269 Unspecified abnormalities of gait and mobility: Secondary | ICD-10-CM | POA: Diagnosis not present

## 2011-12-18 DIAGNOSIS — K589 Irritable bowel syndrome without diarrhea: Secondary | ICD-10-CM | POA: Diagnosis not present

## 2011-12-18 DIAGNOSIS — R269 Unspecified abnormalities of gait and mobility: Secondary | ICD-10-CM | POA: Diagnosis not present

## 2011-12-18 DIAGNOSIS — Z96659 Presence of unspecified artificial knee joint: Secondary | ICD-10-CM | POA: Diagnosis not present

## 2011-12-18 DIAGNOSIS — IMO0001 Reserved for inherently not codable concepts without codable children: Secondary | ICD-10-CM | POA: Diagnosis not present

## 2011-12-18 DIAGNOSIS — Z471 Aftercare following joint replacement surgery: Secondary | ICD-10-CM | POA: Diagnosis not present

## 2011-12-18 DIAGNOSIS — G8918 Other acute postprocedural pain: Secondary | ICD-10-CM | POA: Diagnosis not present

## 2011-12-19 DIAGNOSIS — Z471 Aftercare following joint replacement surgery: Secondary | ICD-10-CM | POA: Diagnosis not present

## 2011-12-19 DIAGNOSIS — R269 Unspecified abnormalities of gait and mobility: Secondary | ICD-10-CM | POA: Diagnosis not present

## 2011-12-19 DIAGNOSIS — G8918 Other acute postprocedural pain: Secondary | ICD-10-CM | POA: Diagnosis not present

## 2011-12-19 DIAGNOSIS — IMO0001 Reserved for inherently not codable concepts without codable children: Secondary | ICD-10-CM | POA: Diagnosis not present

## 2011-12-19 DIAGNOSIS — Z96659 Presence of unspecified artificial knee joint: Secondary | ICD-10-CM | POA: Diagnosis not present

## 2011-12-19 DIAGNOSIS — K589 Irritable bowel syndrome without diarrhea: Secondary | ICD-10-CM | POA: Diagnosis not present

## 2011-12-20 DIAGNOSIS — IMO0001 Reserved for inherently not codable concepts without codable children: Secondary | ICD-10-CM | POA: Diagnosis not present

## 2011-12-20 DIAGNOSIS — G8918 Other acute postprocedural pain: Secondary | ICD-10-CM | POA: Diagnosis not present

## 2011-12-20 DIAGNOSIS — Z96659 Presence of unspecified artificial knee joint: Secondary | ICD-10-CM | POA: Diagnosis not present

## 2011-12-20 DIAGNOSIS — K589 Irritable bowel syndrome without diarrhea: Secondary | ICD-10-CM | POA: Diagnosis not present

## 2011-12-20 DIAGNOSIS — R269 Unspecified abnormalities of gait and mobility: Secondary | ICD-10-CM | POA: Diagnosis not present

## 2011-12-20 DIAGNOSIS — Z471 Aftercare following joint replacement surgery: Secondary | ICD-10-CM | POA: Diagnosis not present

## 2011-12-24 DIAGNOSIS — Z96659 Presence of unspecified artificial knee joint: Secondary | ICD-10-CM | POA: Diagnosis not present

## 2011-12-24 DIAGNOSIS — K589 Irritable bowel syndrome without diarrhea: Secondary | ICD-10-CM | POA: Diagnosis not present

## 2011-12-24 DIAGNOSIS — IMO0001 Reserved for inherently not codable concepts without codable children: Secondary | ICD-10-CM | POA: Diagnosis not present

## 2011-12-24 DIAGNOSIS — Z471 Aftercare following joint replacement surgery: Secondary | ICD-10-CM | POA: Diagnosis not present

## 2011-12-24 DIAGNOSIS — R269 Unspecified abnormalities of gait and mobility: Secondary | ICD-10-CM | POA: Diagnosis not present

## 2011-12-24 DIAGNOSIS — G8918 Other acute postprocedural pain: Secondary | ICD-10-CM | POA: Diagnosis not present

## 2011-12-25 DIAGNOSIS — Z471 Aftercare following joint replacement surgery: Secondary | ICD-10-CM | POA: Diagnosis not present

## 2011-12-25 DIAGNOSIS — G8918 Other acute postprocedural pain: Secondary | ICD-10-CM | POA: Diagnosis not present

## 2011-12-25 DIAGNOSIS — Z96659 Presence of unspecified artificial knee joint: Secondary | ICD-10-CM | POA: Diagnosis not present

## 2011-12-25 DIAGNOSIS — IMO0001 Reserved for inherently not codable concepts without codable children: Secondary | ICD-10-CM | POA: Diagnosis not present

## 2011-12-25 DIAGNOSIS — R269 Unspecified abnormalities of gait and mobility: Secondary | ICD-10-CM | POA: Diagnosis not present

## 2011-12-25 DIAGNOSIS — K589 Irritable bowel syndrome without diarrhea: Secondary | ICD-10-CM | POA: Diagnosis not present

## 2011-12-26 DIAGNOSIS — R269 Unspecified abnormalities of gait and mobility: Secondary | ICD-10-CM | POA: Diagnosis not present

## 2011-12-26 DIAGNOSIS — K589 Irritable bowel syndrome without diarrhea: Secondary | ICD-10-CM | POA: Diagnosis not present

## 2011-12-26 DIAGNOSIS — Z471 Aftercare following joint replacement surgery: Secondary | ICD-10-CM | POA: Diagnosis not present

## 2011-12-26 DIAGNOSIS — Z96659 Presence of unspecified artificial knee joint: Secondary | ICD-10-CM | POA: Diagnosis not present

## 2011-12-26 DIAGNOSIS — G8918 Other acute postprocedural pain: Secondary | ICD-10-CM | POA: Diagnosis not present

## 2011-12-26 DIAGNOSIS — IMO0001 Reserved for inherently not codable concepts without codable children: Secondary | ICD-10-CM | POA: Diagnosis not present

## 2011-12-26 NOTE — Discharge Summary (Signed)
Physician Discharge Summary   Patient ID: Erica Alvarez MRN: [id number] DOB/AGE: 67/08/1945 67 y.o.  Admit date: 12/10/2011 Discharge date: 12/13/11  Primary Diagnosis: Osteoarthritis Left knee   Admission Diagnoses:  Past Medical History  Diagnosis Date  . Arthritis   . Stroke     2007 - no deficits per patient  . GERD (gastroesophageal reflux disease)   . Cancer     basal cell cancer on nose   . Neuromuscular disorder     benign essential tremors - reason for Inderal    Discharge Diagnoses:   Principal Problem:  *OA (osteoarthritis) of knee Active Problems:  Postop Acute blood loss anemia  Postop Hyponatremia  Estimated Body mass index is 31.74 kg/(m^2) as calculated from the following:   Height as of this encounter: 5\' 0" (1.524 m).   Weight as of this encounter: 162 lb 8 oz(73.71 kg).  Classification of overweight in adults according to BMI (WHO, 1998)   Procedure:  Procedure(s) (LRB): TOTAL KNEE ARTHROPLASTY (Left)   Consults: None  HPI: Erica Alvarez is a 66 y.o. year old female with end stage OA of her left knee with progressively worsening pain and dysfunction. She has constant pain, with activity and at rest and significant functional deficits with difficulties even with ADLs. She has had extensive non-op management including analgesics, injections of cortisone and viscosupplements, and home exercise program, but remains in significant pain with significant dysfunction. Radiographs show bone on bone arthritis medial and patellofemoral. She presents now for left Total Knee Arthroplasty.   Laboratory Data: Admission on 12/10/2011, Discharged on 12/13/2011  Component Date Value Range Status  . ABO/RH(D) 12/10/2011 B NEG   Final  . Antibody Screen 12/10/2011 NEG   Final  . Sample Expiration 12/10/2011 12/13/2011   Final  . WBC 12/11/2011 9.6  4.0 - 10.5 K/uL Final  . RBC 12/11/2011 3.59* 3.87 - 5.11 MIL/uL Final  . Hemoglobin 12/11/2011 10.5* 12.0 - 15.0  g/dL Final  . HCT 40/98/1191 31.7* 36.0 - 46.0 % Final  . MCV 12/11/2011 88.3  78.0 - 100.0 fL Final  . MCH 12/11/2011 29.2  26.0 - 34.0 pg Final  . MCHC 12/11/2011 33.1  30.0 - 36.0 g/dL Final  . RDW 47/82/9562 14.1  11.5 - 15.5 % Final  . Platelets 12/11/2011 226  150 - 400 K/uL Final  . Sodium 12/11/2011 137  135 - 145 mEq/L Final  . Potassium 12/11/2011 3.6  3.5 - 5.1 mEq/L Final  . Chloride 12/11/2011 103  96 - 112 mEq/L Final  . CO2 12/11/2011 28  19 - 32 mEq/L Final  . Glucose, Bld 12/11/2011 144* 70 - 99 mg/dL Final  . BUN 13/10/6576 8  6 - 23 mg/dL Final  . Creatinine, Ser 12/11/2011 0.57  0.50 - 1.10 mg/dL Final  . Calcium 46/96/2952 8.3* 8.4 - 10.5 mg/dL Final  . GFR calc non Af Amer 12/11/2011 >90  >90 mL/min Final  . GFR calc Af Amer 12/11/2011 >90  >90 mL/min Final   Comment:                                 The eGFR has been calculated                          using the CKD EPI equation.  This calculation has not been                          validated in all clinical                          situations.                          eGFR's persistently                          <90 mL/min signify                          possible Chronic Kidney Disease.  . WBC 12/12/2011 11.5* 4.0 - 10.5 K/uL Final  . RBC 12/12/2011 3.53* 3.87 - 5.11 MIL/uL Final  . Hemoglobin 12/12/2011 10.3* 12.0 - 15.0 g/dL Final  . HCT 16/12/9602 31.3* 36.0 - 46.0 % Final  . MCV 12/12/2011 88.7  78.0 - 100.0 fL Final  . MCH 12/12/2011 29.2  26.0 - 34.0 pg Final  . MCHC 12/12/2011 32.9  30.0 - 36.0 g/dL Final  . RDW 54/11/8117 14.0  11.5 - 15.5 % Final  . Platelets 12/12/2011 228  150 - 400 K/uL Final  . Sodium 12/12/2011 134* 135 - 145 mEq/L Final  . Potassium 12/12/2011 3.7  3.5 - 5.1 mEq/L Final  . Chloride 12/12/2011 100  96 - 112 mEq/L Final  . CO2 12/12/2011 27  19 - 32 mEq/L Final  . Glucose, Bld 12/12/2011 117* 70 - 99 mg/dL Final  . BUN 14/78/2956 6  6 - 23 mg/dL  Final  . Creatinine, Ser 12/12/2011 0.68  0.50 - 1.10 mg/dL Final  . Calcium 21/30/8657 8.5  8.4 - 10.5 mg/dL Final  . GFR calc non Af Amer 12/12/2011 89* >90 mL/min Final  . GFR calc Af Amer 12/12/2011 >90  >90 mL/min Final   Comment:                                 The eGFR has been calculated                          using the CKD EPI equation.                          This calculation has not been                          validated in all clinical                          situations.                          eGFR's persistently                          <90 mL/min signify                          possible Chronic Kidney Disease.  . WBC 12/13/2011 15.7* 4.0 - 10.5 K/uL  Final  . RBC 12/13/2011 3.49* 3.87 - 5.11 MIL/uL Final  . Hemoglobin 12/13/2011 10.2* 12.0 - 15.0 g/dL Final  . HCT 14/78/2956 30.5* 36.0 - 46.0 % Final  . MCV 12/13/2011 87.4  78.0 - 100.0 fL Final  . MCH 12/13/2011 29.2  26.0 - 34.0 pg Final  . MCHC 12/13/2011 33.4  30.0 - 36.0 g/dL Final  . RDW 21/30/8657 13.8  11.5 - 15.5 % Final  . Platelets 12/13/2011 263  150 - 400 K/uL Final  Hospital Outpatient Visit on 12/04/2011  Component Date Value Range Status  . MRSA, PCR 12/04/2011 NEGATIVE  NEGATIVE Final  . Staphylococcus aureus 12/04/2011 NEGATIVE  NEGATIVE Final   Comment:                                 The Xpert SA Assay (FDA                          approved for NASAL specimens                          in patients over 29 years of age),                          is one component of                          a comprehensive surveillance                          program.  Test performance has                          been validated by [employer] for patients greater                          than or equal to 20 year old.                          It is not intended                          to diagnose infection nor to                          guide or monitor treatment.  . WBC  12/04/2011 10.5  4.0 - 10.5 K/uL Final  . RBC 12/04/2011 4.64  3.87 - 5.11 MIL/uL Final  . Hemoglobin 12/04/2011 13.4  12.0 - 15.0 g/dL Final  . HCT 84/69/6295 41.1  36.0 - 46.0 % Final  . MCV 12/04/2011 88.6  78.0 - 100.0 fL Final  . MCH 12/04/2011 28.9  26.0 - 34.0 pg Final  . MCHC 12/04/2011 32.6  30.0 - 36.0 g/dL Final  . RDW 28/41/3244 14.8  11.5 - 15.5 % Final  . Platelets 12/04/2011 222  150 - 400 K/uL Final  . Sodium 12/04/2011 137  135 - 145 mEq/L Final  . Potassium 12/04/2011 4.6  3.5 -  5.1 mEq/L Final  . Chloride 12/04/2011 99  96 - 112 mEq/L Final  . CO2 12/04/2011 29  19 - 32 mEq/L Final  . Glucose, Bld 12/04/2011 82  70 - 99 mg/dL Final  . BUN 16/12/9602 10  6 - 23 mg/dL Final  . Creatinine, Ser 12/04/2011 0.63  0.50 - 1.10 mg/dL Final  . Calcium 54/11/8117 9.7  8.4 - 10.5 mg/dL Final  . GFR calc non Af Amer 12/04/2011 >90  >90 mL/min Final  . GFR calc Af Amer 12/04/2011 >90  >90 mL/min Final   Comment:                                 The eGFR has been calculated                          using the CKD EPI equation.                          This calculation has not been                          validated in all clinical                          situations.                          eGFR's persistently                          <90 mL/min signify                          possible Chronic Kidney Disease.  Marland Kitchen aPTT 12/04/2011 28  24 - 37 seconds Final  . Prothrombin Time 12/04/2011 12.5  11.6 - 15.2 seconds Final  . INR 12/04/2011 0.94  0.00 - 1.49 Final     X-Rays:No results found.  EKG: Orders placed during the hospital encounter of 12/10/11  . EKG 12-LEAD  . EKG 12-LEAD  . EKG     Hospital Course:  Erica Alvarez is a 66 y.o. who was admitted to [facility]. They were brought to the operating room on 12/10/2011 and underwent Procedure(s): TOTAL KNEE ARTHROPLASTY.  Patient tolerated the procedure well and was later transferred to the recovery room and then  to the orthopaedic floor for postoperative care.  They were given PO and IV analgesics for pain control following their surgery.  They were given 24 hours of postoperative antibiotics of  Anti-infectives     Start     Dose/Rate Route Frequency Ordered Stop   12/11/11 1700   fluconazole (DIFLUCAN) tablet 200 mg        200 mg Oral  Once 12/11/11 1609 12/11/11 1730   12/10/11 2200   ceFAZolin (ANCEF) IVPB 1 g/50 mL premix        1 g 100 mL/hr over 30 Minutes Intravenous 4 times per day 12/10/11 1824 12/11/11 0606   12/10/11 1245   ceFAZolin (ANCEF) IVPB 2 g/50 mL premix  Status:  Discontinued        2 g 100 mL/hr over 30 Minutes Intravenous 60 min pre-op 12/10/11 1245 12/10/11 1806  and started on DVT prophylaxis in the form of Xarelto.   PT and OT were ordered for total joint protocol.  Discharge planning consulted to help with postop disposition and equipment needs.  Patient had a tough night on the evening of surgery with pain but started to get up OOB with therapy on day one and walked 30 feet.   Hemovac drain was pulled without difficulty.  Continued to work with therapy into day two and walked over 150 feet.  Dressing was changed on day two and the incision was healing well.  By day three, the patient had progressed with therapy and meeting their goals.  Incision was healing well.  Patient was seen in rounds and was ready to go home.  Discharge Medications: Prior to Admission medications   Medication Sig Start Date End Date Taking? Authorizing Provider  diazepam (VALIUM) 5 MG tablet Take 2.5 mg by mouth as needed. Pt can takes 2 times daily as needed   Yes Historical Provider, MD  propranolol (INDERAL) 10 MG tablet Take 10 mg by mouth 2 (two) times daily.   Yes Historical Provider, MD  acetaminophen (TYLENOL) 500 MG tablet Take 500 mg by mouth every 6 (six) hours as needed. For pain    Historical Provider, MD  HYDROmorphone (DILAUDID) 2 MG tablet Take 1-2 tablets (2-4 mg total) by  mouth every 4 (four) hours as needed. 12/12/11   Jaques Mineer Julien Girt, PA  methocarbamol (ROBAXIN) 500 MG tablet Take 1 tablet (500 mg total) by mouth every 6 (six) hours as needed. 12/12/11   Murriel Holwerda Julien Girt, PA  rivaroxaban (XARELTO) 10 MG TABS tablet Take 1 tablet (10 mg total) by mouth daily with breakfast. Take Xarelto for two and a half more weeks, then discontinue Xarelto. Once the patient has completed the Xarelto, they may resume the 81 mg Aspirin. 12/12/11   Dariyah Garduno, PA    Diet: Cardiac diet Activity:WBAT Follow-up:in 2 weeks Disposition - Home Discharged Condition: good   Discharge Orders    Future Orders Please Complete By Expires   Diet - low sodium heart healthy      Call MD / Call 911      Comments:   If you experience chest pain or shortness of breath, CALL 911 and be transported to the hospital emergency room.  If you develope a fever above 101 F, pus (white drainage) or increased drainage or redness at the wound, or calf pain, call your surgeon's office.   Discharge instructions      Comments:   Pick up stool softner and laxative for home. Do not submerge incision under water. May shower. Continue to use ice for pain and swelling from surgery.  Take Xarelto for two and a half more weeks, then discontinue Xarelto. Once the patient has completed the Xarelto, they may resume the 81 mg Aspirin.   Constipation Prevention      Comments:   Drink plenty of fluids.  Prune juice may be helpful.  You may use a stool softener, such as Colace (over the counter) 100 mg twice a day.  Use MiraLax (over the counter) for constipation as needed.   Increase activity slowly as tolerated      Patient may shower      Comments:   You may shower without a dressing once there is no drainage.  Do not wash over the wound.  If drainage remains, do not shower until drainage stops.   Driving restrictions      Comments:  No driving until released by the physician.   Lifting  restrictions      Comments:   No lifting until released by the physician.   TED hose      Comments:   Use stockings (TED hose) for 3 weeks on both leg(s).  You may remove them at night for sleeping.   Change dressing      Comments:   Change dressing daily with sterile 4 x 4 inch gauze dressing and apply TED hose. Do not submerge the incision under water.   Do not put a pillow under the knee. Place it under the heel.      Do not sit on low chairs, stoools or toilet seats, as it may be difficult to get up from low surfaces          Medication List     As of 12/26/2011  8:57 AM    STOP taking these medications         aspirin EC 81 MG tablet      cholecalciferol 1000 UNITS tablet   Commonly known as: VITAMIN D      multivitamin with minerals Tabs      omega-3 acid ethyl esters 1 G capsule   Commonly known as: LOVAZA      Red Yeast Rice Extract 600 MG Caps      TAKE these medications         acetaminophen 500 MG tablet   Commonly known as: TYLENOL   Take 500 mg by mouth every 6 (six) hours as needed. For pain      diazepam 5 MG tablet   Commonly known as: VALIUM   Take 2.5 mg by mouth as needed. Pt can takes 2 times daily as needed      HYDROmorphone 2 MG tablet   Commonly known as: DILAUDID   Take 1-2 tablets (2-4 mg total) by mouth every 4 (four) hours as needed.      methocarbamol 500 MG tablet   Commonly known as: ROBAXIN   Take 1 tablet (500 mg total) by mouth every 6 (six) hours as needed.      propranolol 10 MG tablet   Commonly known as: INDERAL   Take 10 mg by mouth 2 (two) times daily.      rivaroxaban 10 MG Tabs tablet   Commonly known as: XARELTO   Take 1 tablet (10 mg total) by mouth daily with breakfast. Take Xarelto for two and a half more weeks, then discontinue Xarelto.  Once the patient has completed the Xarelto, they may resume the 81 mg Aspirin.           Follow-up Information    Follow up with Loanne Drilling, MD. Schedule an  appointment as soon as possible for a visit in 2 weeks.   Contact information:   [address] [place] [address] [phone]          Signed: Patrica Duel 12/26/2011, 8:57 AM

## 2011-12-27 DIAGNOSIS — Z471 Aftercare following joint replacement surgery: Secondary | ICD-10-CM | POA: Diagnosis not present

## 2011-12-27 DIAGNOSIS — R269 Unspecified abnormalities of gait and mobility: Secondary | ICD-10-CM | POA: Diagnosis not present

## 2011-12-27 DIAGNOSIS — Z96659 Presence of unspecified artificial knee joint: Secondary | ICD-10-CM | POA: Diagnosis not present

## 2011-12-27 DIAGNOSIS — G8918 Other acute postprocedural pain: Secondary | ICD-10-CM | POA: Diagnosis not present

## 2011-12-27 DIAGNOSIS — IMO0001 Reserved for inherently not codable concepts without codable children: Secondary | ICD-10-CM | POA: Diagnosis not present

## 2011-12-27 DIAGNOSIS — K589 Irritable bowel syndrome without diarrhea: Secondary | ICD-10-CM | POA: Diagnosis not present

## 2011-12-30 DIAGNOSIS — IMO0001 Reserved for inherently not codable concepts without codable children: Secondary | ICD-10-CM | POA: Diagnosis not present

## 2011-12-30 DIAGNOSIS — Z96659 Presence of unspecified artificial knee joint: Secondary | ICD-10-CM | POA: Diagnosis not present

## 2011-12-30 DIAGNOSIS — Z471 Aftercare following joint replacement surgery: Secondary | ICD-10-CM | POA: Diagnosis not present

## 2011-12-30 DIAGNOSIS — K589 Irritable bowel syndrome without diarrhea: Secondary | ICD-10-CM | POA: Diagnosis not present

## 2011-12-30 DIAGNOSIS — R269 Unspecified abnormalities of gait and mobility: Secondary | ICD-10-CM | POA: Diagnosis not present

## 2011-12-30 DIAGNOSIS — G8918 Other acute postprocedural pain: Secondary | ICD-10-CM | POA: Diagnosis not present

## 2012-01-01 DIAGNOSIS — K589 Irritable bowel syndrome without diarrhea: Secondary | ICD-10-CM | POA: Diagnosis not present

## 2012-01-01 DIAGNOSIS — IMO0001 Reserved for inherently not codable concepts without codable children: Secondary | ICD-10-CM | POA: Diagnosis not present

## 2012-01-01 DIAGNOSIS — R269 Unspecified abnormalities of gait and mobility: Secondary | ICD-10-CM | POA: Diagnosis not present

## 2012-01-01 DIAGNOSIS — Z471 Aftercare following joint replacement surgery: Secondary | ICD-10-CM | POA: Diagnosis not present

## 2012-01-01 DIAGNOSIS — Z96659 Presence of unspecified artificial knee joint: Secondary | ICD-10-CM | POA: Diagnosis not present

## 2012-01-01 DIAGNOSIS — G8918 Other acute postprocedural pain: Secondary | ICD-10-CM | POA: Diagnosis not present

## 2012-01-03 DIAGNOSIS — R269 Unspecified abnormalities of gait and mobility: Secondary | ICD-10-CM | POA: Diagnosis not present

## 2012-01-03 DIAGNOSIS — K589 Irritable bowel syndrome without diarrhea: Secondary | ICD-10-CM | POA: Diagnosis not present

## 2012-01-03 DIAGNOSIS — IMO0001 Reserved for inherently not codable concepts without codable children: Secondary | ICD-10-CM | POA: Diagnosis not present

## 2012-01-03 DIAGNOSIS — G8918 Other acute postprocedural pain: Secondary | ICD-10-CM | POA: Diagnosis not present

## 2012-01-03 DIAGNOSIS — Z96659 Presence of unspecified artificial knee joint: Secondary | ICD-10-CM | POA: Diagnosis not present

## 2012-01-03 DIAGNOSIS — Z471 Aftercare following joint replacement surgery: Secondary | ICD-10-CM | POA: Diagnosis not present

## 2012-03-06 DIAGNOSIS — Z96659 Presence of unspecified artificial knee joint: Secondary | ICD-10-CM | POA: Diagnosis not present

## 2012-03-12 DIAGNOSIS — M25559 Pain in unspecified hip: Secondary | ICD-10-CM | POA: Diagnosis not present

## 2012-03-12 DIAGNOSIS — M5126 Other intervertebral disc displacement, lumbar region: Secondary | ICD-10-CM | POA: Diagnosis not present

## 2012-03-13 DIAGNOSIS — IMO0002 Reserved for concepts with insufficient information to code with codable children: Secondary | ICD-10-CM | POA: Diagnosis not present

## 2012-03-19 ENCOUNTER — Other Ambulatory Visit (HOSPITAL_COMMUNITY): Payer: Self-pay | Admitting: Physical Medicine and Rehabilitation

## 2012-03-19 DIAGNOSIS — C4491 Basal cell carcinoma of skin, unspecified: Secondary | ICD-10-CM

## 2012-03-20 DIAGNOSIS — J309 Allergic rhinitis, unspecified: Secondary | ICD-10-CM | POA: Diagnosis not present

## 2012-03-20 DIAGNOSIS — F411 Generalized anxiety disorder: Secondary | ICD-10-CM | POA: Diagnosis not present

## 2012-03-23 ENCOUNTER — Other Ambulatory Visit: Payer: Self-pay | Admitting: Neurological Surgery

## 2012-03-23 DIAGNOSIS — M545 Low back pain: Secondary | ICD-10-CM

## 2012-03-23 DIAGNOSIS — M79606 Pain in leg, unspecified: Secondary | ICD-10-CM

## 2012-03-24 ENCOUNTER — Encounter (HOSPITAL_COMMUNITY)
Admission: RE | Admit: 2012-03-24 | Discharge: 2012-03-24 | Disposition: A | Discharge status: Home / Self Care | Payer: Medicare Other | Source: Ambulatory Visit | Attending: Physical Medicine and Rehabilitation | Admitting: Physical Medicine and Rehabilitation

## 2012-03-24 DIAGNOSIS — M25559 Pain in unspecified hip: Secondary | ICD-10-CM | POA: Diagnosis not present

## 2012-03-24 DIAGNOSIS — M79609 Pain in unspecified limb: Secondary | ICD-10-CM | POA: Insufficient documentation

## 2012-03-24 DIAGNOSIS — Z471 Aftercare following joint replacement surgery: Secondary | ICD-10-CM | POA: Diagnosis not present

## 2012-03-24 DIAGNOSIS — M545 Low back pain, unspecified: Secondary | ICD-10-CM | POA: Diagnosis not present

## 2012-03-24 DIAGNOSIS — Z85828 Personal history of other malignant neoplasm of skin: Secondary | ICD-10-CM | POA: Insufficient documentation

## 2012-03-24 DIAGNOSIS — Z96659 Presence of unspecified artificial knee joint: Secondary | ICD-10-CM | POA: Insufficient documentation

## 2012-03-24 DIAGNOSIS — C4491 Basal cell carcinoma of skin, unspecified: Secondary | ICD-10-CM

## 2012-03-24 MED ORDER — TECHNETIUM TC 99M MEDRONATE IV KIT
25.0000 | PACK | Freq: Once | INTRAVENOUS | Status: AC | PRN
Start: 2012-03-24 — End: 2012-03-24
  Administered 2012-03-24: 25 via INTRAVENOUS

## 2012-03-27 ENCOUNTER — Other Ambulatory Visit: Payer: Self-pay | Admitting: Neurological Surgery

## 2012-03-27 ENCOUNTER — Inpatient Hospital Stay
Admission: RE | Admit: 2012-03-27 | Discharge: 2012-03-27 | Disposition: A | Discharge status: Home / Self Care | Payer: Self-pay | Source: Ambulatory Visit | Attending: Neurological Surgery | Admitting: Neurological Surgery

## 2012-03-27 ENCOUNTER — Ambulatory Visit
Admission: RE | Admit: 2012-03-27 | Discharge: 2012-03-27 | Disposition: A | Discharge status: Home / Self Care | Payer: No Typology Code available for payment source | Source: Ambulatory Visit | Attending: Neurological Surgery | Admitting: Neurological Surgery

## 2012-03-27 ENCOUNTER — Ambulatory Visit
Admission: RE | Admit: 2012-03-27 | Discharge: 2012-03-27 | Disposition: A | Discharge status: Home / Self Care | Payer: Medicare Other | Source: Ambulatory Visit | Attending: Neurological Surgery | Admitting: Neurological Surgery

## 2012-03-27 VITALS — BP 136/67 | HR 70

## 2012-03-27 DIAGNOSIS — M549 Dorsalgia, unspecified: Secondary | ICD-10-CM

## 2012-03-27 DIAGNOSIS — M545 Low back pain, unspecified: Secondary | ICD-10-CM | POA: Diagnosis not present

## 2012-03-27 DIAGNOSIS — M5126 Other intervertebral disc displacement, lumbar region: Secondary | ICD-10-CM | POA: Diagnosis not present

## 2012-03-27 DIAGNOSIS — M79606 Pain in leg, unspecified: Secondary | ICD-10-CM

## 2012-03-27 MED ORDER — IOHEXOL 180 MG/ML  SOLN
15.0000 mL | Freq: Once | INTRAMUSCULAR | Status: AC | PRN
  Administered 2012-03-27: 15 mL via INTRATHECAL

## 2012-03-27 MED ORDER — MEPERIDINE HCL 100 MG/ML IJ SOLN
75.0000 mg | Freq: Once | INTRAMUSCULAR | Status: AC
  Administered 2012-03-27: 75 mg via INTRAMUSCULAR

## 2012-03-27 MED ORDER — ONDANSETRON HCL 4 MG/2ML IJ SOLN
4.0000 mg | Freq: Once | INTRAMUSCULAR | Status: AC
  Administered 2012-03-27: 4 mg via INTRAMUSCULAR

## 2012-03-27 MED ORDER — DIAZEPAM 5 MG PO TABS
5.0000 mg | ORAL_TABLET | Freq: Once | ORAL | Status: AC
  Administered 2012-03-27: 5 mg via ORAL

## 2012-04-06 DIAGNOSIS — IMO0002 Reserved for concepts with insufficient information to code with codable children: Secondary | ICD-10-CM | POA: Diagnosis not present

## 2012-04-07 DIAGNOSIS — M169 Osteoarthritis of hip, unspecified: Secondary | ICD-10-CM | POA: Diagnosis not present

## 2012-04-07 DIAGNOSIS — M1610 Unilateral primary osteoarthritis, unspecified hip: Secondary | ICD-10-CM | POA: Diagnosis not present

## 2012-04-14 ENCOUNTER — Other Ambulatory Visit: Payer: Self-pay | Admitting: Orthopedic Surgery

## 2012-04-14 MED ORDER — BUPIVACAINE LIPOSOME 1.3 % IJ SUSP: 20.0000 mL | Freq: Once | INTRAMUSCULAR | Status: DC

## 2012-04-14 MED ORDER — DEXAMETHASONE SODIUM PHOSPHATE 10 MG/ML IJ SOLN: 10.0000 mg | Freq: Once | INTRAMUSCULAR | Status: DC

## 2012-04-14 NOTE — Progress Notes (Signed)
Preoperative surgical orders have been place into the Epic hospital system for Erica Alvarez on 04/14/2012, 5:52 PM  by Patrica Duel for surgery on 04/29/2012.  Preop Total Hip - Anterior Approach orders including Experel Injecion, IV Tylenol, and IV Decadron as long as there are no contraindications to the above medications. Erica Peace, PA-C

## 2012-04-18 ENCOUNTER — Other Ambulatory Visit: Payer: Self-pay

## 2012-04-20 DIAGNOSIS — M899 Disorder of bone, unspecified: Secondary | ICD-10-CM | POA: Diagnosis not present

## 2012-04-20 DIAGNOSIS — Z1231 Encounter for screening mammogram for malignant neoplasm of breast: Secondary | ICD-10-CM | POA: Diagnosis not present

## 2012-04-20 DIAGNOSIS — M949 Disorder of cartilage, unspecified: Secondary | ICD-10-CM | POA: Diagnosis not present

## 2012-04-21 ENCOUNTER — Encounter (HOSPITAL_COMMUNITY): Payer: Self-pay | Admitting: Pharmacy Technician

## 2012-04-22 NOTE — Progress Notes (Signed)
EKG 10/13 EPIC

## 2012-04-22 NOTE — Patient Instructions (Signed)
20 Erica Alvarez  04/22/2012   Your procedure is scheduled on:  04/29/12  [facility]  Report to [facility] at  1045     AM.  Call this number if you have problems the morning of surgery: [phone]       Remember:   Do not eat food  Or drink :After Midnight. Tuesday NIGHT   Take these medicines the morning of surgery with A SIP OF WATER:   PROPANOLOL   .  Contacts, dentures or partial plates can not be worn to surgery  Leave suitcase in the car. After surgery it may be brought to your room.  For patients admitted to the hospital, checkout time is 11:00 AM day of  discharge.             SPECIAL INSTRUCTIONS- SEE [place] PREPARING FOR SURGERY INSTRUCTION SHEET-     DO NOT WEAR JEWELRY, LOTIONS, POWDERS, OR PERFUMES.  WOMEN-- DO NOT SHAVE LEGS OR UNDERARMS FOR 12 HOURS BEFORE SHOWERS. MEN MAY SHAVE FACE.  Patients discharged the day of surgery will not be allowed to drive home. IF going home the day of surgery, you must have a driver and someone to stay with you for the first 24 hours  Name and phone number of your driver:                                                                        Please read over the following fact sheets that you were given: MRSA Information, Incentive Spirometry Sheet, Blood Transfusion Sheet  Information                                                                                   Kaja Jackowski  PST 336  [phone]                 FAILURE TO FOLLOW THESE INSTRUCTIONS MAY RESULT IN  CANCELLATION   OF YOUR SURGERY                                                  Patient Signature _____________________________

## 2012-04-23 ENCOUNTER — Inpatient Hospital Stay (HOSPITAL_COMMUNITY)
Admission: RE | Admit: 2012-04-23 | Discharge: 2012-04-23 | Disposition: A | Discharge status: Home / Self Care | Payer: Medicare Other | Source: Ambulatory Visit

## 2012-04-24 ENCOUNTER — Encounter (HOSPITAL_COMMUNITY)
Admission: RE | Admit: 2012-04-24 | Discharge: 2012-04-24 | Disposition: A | Discharge status: Home / Self Care | Payer: Medicare Other | Source: Ambulatory Visit | Attending: Orthopedic Surgery | Admitting: Orthopedic Surgery

## 2012-04-24 ENCOUNTER — Encounter (HOSPITAL_COMMUNITY): Payer: Self-pay

## 2012-04-24 ENCOUNTER — Ambulatory Visit (HOSPITAL_COMMUNITY)
Admission: RE | Admit: 2012-04-24 | Discharge: 2012-04-24 | Disposition: A | Discharge status: Home / Self Care | Payer: Medicare Other | Source: Ambulatory Visit | Attending: Orthopedic Surgery | Admitting: Orthopedic Surgery

## 2012-04-24 DIAGNOSIS — Z01818 Encounter for other preprocedural examination: Secondary | ICD-10-CM | POA: Diagnosis not present

## 2012-04-24 DIAGNOSIS — M169 Osteoarthritis of hip, unspecified: Secondary | ICD-10-CM | POA: Diagnosis not present

## 2012-04-24 DIAGNOSIS — Z01812 Encounter for preprocedural laboratory examination: Secondary | ICD-10-CM | POA: Insufficient documentation

## 2012-04-24 DIAGNOSIS — M25559 Pain in unspecified hip: Secondary | ICD-10-CM | POA: Insufficient documentation

## 2012-04-24 DIAGNOSIS — M1610 Unilateral primary osteoarthritis, unspecified hip: Secondary | ICD-10-CM | POA: Insufficient documentation

## 2012-04-24 LAB — URINE MICROSCOPIC-ADD ON

## 2012-04-24 LAB — URINALYSIS, ROUTINE W REFLEX MICROSCOPIC
Hgb urine dipstick: NEGATIVE
Ketones, ur: NEGATIVE mg/dL
Protein, ur: NEGATIVE mg/dL
Urobilinogen, UA: 0.2 mg/dL (ref 0.0–1.0)

## 2012-04-24 LAB — SURGICAL PCR SCREEN: MRSA, PCR: NEGATIVE

## 2012-04-24 LAB — COMPREHENSIVE METABOLIC PANEL
ALT: 11 U/L (ref 0–35)
AST: 13 U/L (ref 0–37)
Calcium: 9.4 mg/dL (ref 8.4–10.5)
Sodium: 137 mEq/L (ref 135–145)
Total Protein: 6.7 g/dL (ref 6.0–8.3)

## 2012-04-24 LAB — CBC
MCH: 27.7 pg (ref 26.0–34.0)
MCHC: 32 g/dL (ref 30.0–36.0)
Platelets: 292 10*3/uL (ref 150–400)
RDW: 15.1 % (ref 11.5–15.5)

## 2012-04-24 NOTE — Progress Notes (Signed)
Received fax from Dr Salina April office that Cipro was cal;led in for patient

## 2012-04-24 NOTE — Patient Instructions (Addendum)
20 Erica Alvarez  04/24/2012   Your procedure is scheduled on:  04/29/12  [facility]  Report to [facility] at  1045     AM.  Call this number if you have problems the morning of surgery: [phone]       Remember:   Do not eat food  Or drink :After Midnight. Tuesday NIGHT   Take these medicines the morning of surgery with A SIP OF WATER:  PROPANOLOL MAY TAKE TRAMADOL/ VALIUM IF NEEDED  .  Contacts, dentures or partial plates can not be worn to surgery  Leave suitcase in the car. After surgery it may be brought to your room.  For patients admitted to the hospital, checkout time is 11:00 AM day of  discharge.             SPECIAL INSTRUCTIONS- SEE [place] PREPARING FOR SURGERY INSTRUCTION SHEET-     DO NOT WEAR JEWELRY, LOTIONS, POWDERS, OR PERFUMES.  WOMEN-- DO NOT SHAVE LEGS OR UNDERARMS FOR 12 HOURS BEFORE SHOWERS. MEN MAY SHAVE FACE.  Patients discharged the day of surgery will not be allowed to drive home. IF going home the day of surgery, you must have a driver and someone to stay with you for the first 24 hours  Name and phone number of your driver:     Husband  Erica Alvarez                                                                   Please read over the following fact sheets that you were given: MRSA Information, Incentive Spirometry Sheet, Blood Transfusion Sheet  Information                                                                                   Erica Alvarez  PST 336  [phone]                 FAILURE TO FOLLOW THESE INSTRUCTIONS MAY RESULT IN  CANCELLATION   OF YOUR SURGERY                                                  Patient Signature _____________________________

## 2012-04-24 NOTE — Progress Notes (Signed)
Faxed U/A with Micro to Dr Lequita Halt through [facility]-  Also spoke with Faith at [facility] who stated she received this and would give report to Gareth Eagle-  Culture Report Pending

## 2012-04-25 LAB — URINE CULTURE

## 2012-04-25 NOTE — H&P (Signed)
TOTAL HIP ADMISSION H&P  Patient is admitted for right total hip arthroplasty.  Subjective:  Chief Complaint: right hip pain  HPI: Erica Alvarez, 67 y.o. female, has a history of pain and functional disability in the right hip(s) due to arthritis and patient has failed non-surgical conservative treatments for greater than 12 weeks to include NSAID's and/or analgesics, corticosteriod injections and activity modification.  Onset of symptoms was abrupt starting 1 year ago with rapidlly worsening course since that time.The patient noted no past surgery on the right hip(s).  Patient currently rates pain in the right hip at 7 out of 10 with activity. Patient has night pain, worsening of pain with activity and weight bearing, pain that interfers with activities of daily living and pain with passive range of motion. Patient has evidence of periarticular osteophytes and joint space narrowing by imaging studies. This condition presents safety issues increasing the risk of falls. There is no current active infection.  Patient Active Problem List   Diagnosis Date Noted  . Postop Acute blood loss anemia 12/12/2011  . Postop Hyponatremia 12/12/2011  . OA (osteoarthritis) of knee 12/10/2011  . ANXIETY STATE, UNSPECIFIED 05/13/2008  . ABDOMINAL PAIN-RUQ 05/13/2008  . DIARRHEA 04/15/2008  . CEREBROVASCULAR ACCIDENT, HX OF 10/23/2007  . HYPERLIPIDEMIA 10/22/2007  . HEMORRHOIDS, INTERNAL 10/22/2007  . EXTERNAL HEMORRHOIDS 10/22/2007  . GERD 10/22/2007  . DIVERTICULOSIS, COLON 10/22/2007  . IBS 10/22/2007  . OSTEOPOROSIS 10/22/2007  . TREMOR 10/22/2007  . COLONIC POLYPS, HX OF 10/22/2007   Past Medical History  Diagnosis Date  . Arthritis   . Stroke     2007 - no deficits per patient  . GERD (gastroesophageal reflux disease)   . Cancer     basal cell cancer on nose   . Neuromuscular disorder     benign essential tremors - reason for Inderal   . Anxiety     occasional  . Peripheral vascular  disease 2003    DVT  left leg post op    Past Surgical History  Procedure Laterality Date  . Abdominal hysterectomy    . Bladder surgery    . Tonsillectomy    . Patent foramen ovale closure    . Total knee arthroplasty  12/10/2011    Procedure: TOTAL KNEE ARTHROPLASTY;  Surgeon: Loanne Drilling, MD;  Location: [facility];  Service: Orthopedics;  Laterality: Left;  . Joint replacement      right knee /left knee     Current outpatient prescriptions: acetaminophen (TYLENOL) 500 MG tablet, Take 500 mg by mouth every 6 (six) hours as needed. For pain, Disp: , Rfl: ;   aspirin EC 81 MG tablet, Take 81 mg by mouth daily., Disp: , Rfl: ;   cholecalciferol (VITAMIN D) 1000 UNITS tablet, Take 1,000 Units by mouth daily., Disp: , Rfl: ;  diazepam (VALIUM) 5 MG tablet, Take 2.5 mg by mouth as needed for anxiety. , Disp: , Rfl:  fish oil-omega-3 fatty acids 1000 MG capsule, Take 1 g by mouth daily., Disp: , Rfl: ;   Multiple Vitamin (MULTIVITAMIN WITH MINERALS) TABS, Take 1 tablet by mouth daily., Disp: , Rfl: ;  propranolol (INDERAL) 10 MG tablet, Take 10 mg by mouth 2 (two) times daily., Disp: , Rfl: ;   traMADol (ULTRAM) 50 MG tablet, Take 50 mg by mouth every 6 (six) hours as needed for pain., Disp: , Rfl:   Allergies  Allergen Reactions  . Penicillins Itching and Nausea And Vomiting  . Sulfonamide Derivatives  Nausea And Vomiting  . Tetracycline Itching and Nausea And Vomiting    History  Substance Use Topics  . Smoking status: Former Smoker    Quit date: 03/04/1980  . Smokeless tobacco: Never Used  . Alcohol Use: Yes     Comment: occ glass of wine     Family History Father deceased age 35; colon cancer Mother deceased age 36; dementia  Review of Systems  Constitutional: Negative.   HENT: Negative.  Negative for neck pain.   Eyes: Negative.   Respiratory: Negative.   Cardiovascular: Negative.   Gastrointestinal: Positive for diarrhea. Negative for heartburn, nausea, vomiting,  abdominal pain and constipation.  Genitourinary: Negative.   Musculoskeletal: Positive for joint pain. Negative for myalgias, back pain and falls.       Right hip pain  Skin: Negative.   Neurological: Negative.   Endo/Heme/Allergies: Negative.   Psychiatric/Behavioral: Negative.     Objective:  Physical Exam  Constitutional: She is oriented to person, place, and time. She appears well-developed and well-nourished. No distress.  HENT:  Head: Normocephalic and atraumatic.  Right Ear: External ear normal.  Left Ear: External ear normal.  Nose: Nose normal.  Mouth/Throat: Oropharynx is clear and moist.  Eyes: Conjunctivae and EOM are normal.  Neck: Normal range of motion. Neck supple. No tracheal deviation present. No thyromegaly present.  Cardiovascular: Normal rate, regular rhythm, normal heart sounds and intact distal pulses.   No murmur heard. Respiratory: Effort normal and breath sounds normal. No respiratory distress. She has no wheezes. She exhibits no tenderness.  GI: Soft. Bowel sounds are normal. She exhibits no distension and no mass. There is no tenderness.  Musculoskeletal:       Right hip: She exhibits decreased range of motion and decreased strength.       Left hip: Normal.       Right knee: Normal.       Left knee: Normal.       Right lower leg: She exhibits no tenderness and no swelling.       Left lower leg: She exhibits no tenderness and no swelling.  Evaluation of her left hip shows normal ROM with no discomfort. Right hip flexes about 90. No internal rotation. About 20 or 30 of external rotation, 30 of abduction. She has pain on any rotational moves.  Lymphadenopathy:    She has no cervical adenopathy.  Neurological: She is alert and oriented to person, place, and time. She has normal strength and normal reflexes. No sensory deficit.  Skin: No rash noted. She is not diaphoretic. No erythema.  Psychiatric: She has a normal mood and affect. Her behavior is  normal.    Vitals Pulse: 64 (Regular) BP: 122/76 (Sitting, Left Arm, Standard)   Estimated body mass index is 31.74 kg/(m^2) as calculated from the following:   Height as of 12/11/11: 5' (1.524 m).   Weight as of 12/04/11: 73.71 kg (162 lb 8 oz).   Imaging Review Plain radiographs demonstrate moderate degenerative joint disease of the right hip(s). The bone quality appears to be good for age and reported activity level.  Assessment/Plan:  End stage arthritis, right hip(s)  The patient history, physical examination, clinical judgement of the provider and imaging studies are consistent with end stage degenerative joint disease of the right hip(s) and total hip arthroplasty is deemed medically necessary. The treatment options including medical management, injection therapy, arthroscopy and arthroplasty were discussed at length. The risks and benefits of total hip arthroplasty were presented and  reviewed. The risks due to aseptic loosening, infection, stiffness, dislocation/subluxation,  thromboembolic complications and other imponderables were discussed.  The patient acknowledged the explanation, agreed to proceed with the plan and consent was signed. Patient is being admitted for inpatient treatment for surgery, pain control, PT, OT, prophylactic antibiotics, VTE prophylaxis, progressive ambulation and ADL's and discharge planning.The patient is planning to be discharged home with home health services     [place], [place]

## 2012-04-27 NOTE — Progress Notes (Signed)
Received fax from Dr Salina April office that Cipro was called in for patient 04/24/12.   04/27/12 Faxed culure report to Dr Lequita Halt through [employer]

## 2012-04-29 ENCOUNTER — Encounter (HOSPITAL_COMMUNITY): Payer: Self-pay | Admitting: Anesthesiology

## 2012-04-29 ENCOUNTER — Inpatient Hospital Stay (HOSPITAL_COMMUNITY)
Admission: RE | Admit: 2012-04-29 | Discharge: 2012-05-01 | DRG: 470 | Disposition: A | Discharge status: Home Health | Payer: Medicare Other | Source: Ambulatory Visit | Attending: Orthopedic Surgery | Admitting: Orthopedic Surgery

## 2012-04-29 ENCOUNTER — Encounter (HOSPITAL_COMMUNITY)
Admission: RE | Disposition: A | Discharge status: Home Health | Payer: Self-pay | Source: Ambulatory Visit | Attending: Orthopedic Surgery

## 2012-04-29 ENCOUNTER — Inpatient Hospital Stay (HOSPITAL_COMMUNITY): Payer: Medicare Other

## 2012-04-29 ENCOUNTER — Inpatient Hospital Stay (HOSPITAL_COMMUNITY): Payer: Medicare Other | Admitting: Anesthesiology

## 2012-04-29 ENCOUNTER — Encounter (HOSPITAL_COMMUNITY): Payer: Self-pay | Admitting: *Deleted

## 2012-04-29 DIAGNOSIS — Z96649 Presence of unspecified artificial hip joint: Secondary | ICD-10-CM | POA: Diagnosis not present

## 2012-04-29 DIAGNOSIS — G250 Essential tremor: Secondary | ICD-10-CM | POA: Diagnosis present

## 2012-04-29 DIAGNOSIS — Z86718 Personal history of other venous thrombosis and embolism: Secondary | ICD-10-CM | POA: Diagnosis not present

## 2012-04-29 DIAGNOSIS — M1610 Unilateral primary osteoarthritis, unspecified hip: Secondary | ICD-10-CM | POA: Diagnosis not present

## 2012-04-29 DIAGNOSIS — Z87891 Personal history of nicotine dependence: Secondary | ICD-10-CM

## 2012-04-29 DIAGNOSIS — K219 Gastro-esophageal reflux disease without esophagitis: Secondary | ICD-10-CM | POA: Diagnosis present

## 2012-04-29 DIAGNOSIS — M25559 Pain in unspecified hip: Secondary | ICD-10-CM | POA: Diagnosis not present

## 2012-04-29 DIAGNOSIS — Z96659 Presence of unspecified artificial knee joint: Secondary | ICD-10-CM | POA: Diagnosis not present

## 2012-04-29 DIAGNOSIS — M169 Osteoarthritis of hip, unspecified: Secondary | ICD-10-CM | POA: Diagnosis not present

## 2012-04-29 DIAGNOSIS — Z8673 Personal history of transient ischemic attack (TIA), and cerebral infarction without residual deficits: Secondary | ICD-10-CM | POA: Diagnosis not present

## 2012-04-29 DIAGNOSIS — G252 Other specified forms of tremor: Secondary | ICD-10-CM | POA: Diagnosis present

## 2012-04-29 DIAGNOSIS — Z471 Aftercare following joint replacement surgery: Secondary | ICD-10-CM | POA: Diagnosis not present

## 2012-04-29 HISTORY — PX: TOTAL HIP ARTHROPLASTY: SHX124

## 2012-04-29 LAB — TYPE AND SCREEN
ABO/RH(D): B NEG
Antibody Screen: NEGATIVE

## 2012-04-29 SURGERY — ARTHROPLASTY, HIP, TOTAL, ANTERIOR APPROACH
Anesthesia: Spinal | Site: Hip | Laterality: Right | Wound class: Clean

## 2012-04-29 MED ORDER — BUPIVACAINE IN DEXTROSE 0.75-8.25 % IT SOLN
INTRATHECAL | Status: DC | PRN
  Administered 2012-04-29: 1.8 mL via INTRATHECAL

## 2012-04-29 MED ORDER — OXYCODONE HCL 5 MG PO TABS
5.0000 mg | ORAL_TABLET | ORAL | Status: DC | PRN
  Administered 2012-04-29: 10 mg via ORAL
  Filled 2012-04-29: qty 2

## 2012-04-29 MED ORDER — METHOCARBAMOL 100 MG/ML IJ SOLN
500.0000 mg | Freq: Four times a day (QID) | INTRAVENOUS | Status: DC | PRN
  Filled 2012-04-29: qty 5

## 2012-04-29 MED ORDER — RIVAROXABAN 10 MG PO TABS
10.0000 mg | ORAL_TABLET | Freq: Every day | ORAL | Status: DC
  Administered 2012-04-30 – 2012-05-01 (×2): 10 mg via ORAL
  Filled 2012-04-29 (×3): qty 1

## 2012-04-29 MED ORDER — LACTATED RINGERS IV SOLN
INTRAVENOUS | Status: DC
  Administered 2012-04-29: 1000 mL via INTRAVENOUS

## 2012-04-29 MED ORDER — DIPHENHYDRAMINE HCL 12.5 MG/5ML PO ELIX: 12.5000 mg | ORAL_SOLUTION | ORAL | Status: DC | PRN

## 2012-04-29 MED ORDER — ACETAMINOPHEN 325 MG PO TABS: 650.0000 mg | ORAL_TABLET | Freq: Four times a day (QID) | ORAL | Status: DC | PRN

## 2012-04-29 MED ORDER — PROMETHAZINE HCL 25 MG/ML IJ SOLN: 6.2500 mg | INTRAMUSCULAR | Status: DC | PRN

## 2012-04-29 MED ORDER — SODIUM CHLORIDE 0.9 % IV SOLN: INTRAVENOUS | Status: DC

## 2012-04-29 MED ORDER — TRAMADOL HCL 50 MG PO TABS
50.0000 mg | ORAL_TABLET | Freq: Four times a day (QID) | ORAL | Status: DC | PRN
  Administered 2012-05-01: 100 mg via ORAL
  Administered 2012-05-01: 50 mg via ORAL
  Filled 2012-04-29: qty 2
  Filled 2012-04-29: qty 1

## 2012-04-29 MED ORDER — FENTANYL CITRATE 0.05 MG/ML IJ SOLN
INTRAMUSCULAR | Status: DC | PRN
  Administered 2012-04-29: 50 ug via INTRAVENOUS

## 2012-04-29 MED ORDER — ONDANSETRON HCL 4 MG/2ML IJ SOLN
INTRAMUSCULAR | Status: DC | PRN
  Administered 2012-04-29: 4 mg via INTRAVENOUS

## 2012-04-29 MED ORDER — ACETAMINOPHEN 10 MG/ML IV SOLN
1000.0000 mg | Freq: Four times a day (QID) | INTRAVENOUS | Status: AC
  Administered 2012-04-29 – 2012-04-30 (×4): 1000 mg via INTRAVENOUS
  Filled 2012-04-29 (×7): qty 100

## 2012-04-29 MED ORDER — 0.9 % SODIUM CHLORIDE (POUR BTL) OPTIME
TOPICAL | Status: DC | PRN
  Administered 2012-04-29: 1000 mL

## 2012-04-29 MED ORDER — PROPOFOL 10 MG/ML IV BOLUS
INTRAVENOUS | Status: DC | PRN
  Administered 2012-04-29: 30 mg via INTRAVENOUS

## 2012-04-29 MED ORDER — MORPHINE SULFATE 2 MG/ML IJ SOLN: 1.0000 mg | INTRAMUSCULAR | Status: DC | PRN

## 2012-04-29 MED ORDER — LACTATED RINGERS IV SOLN: INTRAVENOUS | Status: DC

## 2012-04-29 MED ORDER — MENTHOL 3 MG MT LOZG: 1.0000 | LOZENGE | OROMUCOSAL | Status: DC | PRN

## 2012-04-29 MED ORDER — DEXTROSE-NACL 5-0.9 % IV SOLN
INTRAVENOUS | Status: DC
  Administered 2012-04-30 (×2): via INTRAVENOUS

## 2012-04-29 MED ORDER — ONDANSETRON HCL 4 MG PO TABS: 4.0000 mg | ORAL_TABLET | Freq: Four times a day (QID) | ORAL | Status: DC | PRN

## 2012-04-29 MED ORDER — METHOCARBAMOL 500 MG PO TABS
500.0000 mg | ORAL_TABLET | Freq: Four times a day (QID) | ORAL | Status: DC | PRN
  Administered 2012-04-29 – 2012-05-01 (×6): 500 mg via ORAL
  Filled 2012-04-29 (×6): qty 1

## 2012-04-29 MED ORDER — POLYETHYLENE GLYCOL 3350 17 G PO PACK: 17.0000 g | PACK | Freq: Every day | ORAL | Status: DC | PRN

## 2012-04-29 MED ORDER — PROPOFOL INFUSION 10 MG/ML OPTIME
INTRAVENOUS | Status: DC | PRN
  Administered 2012-04-29: 100 ug/kg/min via INTRAVENOUS

## 2012-04-29 MED ORDER — ACETAMINOPHEN 650 MG RE SUPP: 650.0000 mg | Freq: Four times a day (QID) | RECTAL | Status: DC | PRN

## 2012-04-29 MED ORDER — CEFAZOLIN SODIUM 1-5 GM-% IV SOLN
1.0000 g | Freq: Four times a day (QID) | INTRAVENOUS | Status: AC
  Administered 2012-04-29 – 2012-04-30 (×2): 1 g via INTRAVENOUS
  Filled 2012-04-29 (×2): qty 50

## 2012-04-29 MED ORDER — BUPIVACAINE LIPOSOME 1.3 % IJ SUSP
20.0000 mL | Freq: Once | INTRAMUSCULAR | Status: AC
  Administered 2012-04-29: 20 mL
  Filled 2012-04-29: qty 20

## 2012-04-29 MED ORDER — HYDROMORPHONE HCL 2 MG PO TABS
2.0000 mg | ORAL_TABLET | ORAL | Status: DC | PRN
  Administered 2012-04-29 – 2012-04-30 (×5): 2 mg via ORAL
  Filled 2012-04-29 (×5): qty 1

## 2012-04-29 MED ORDER — METOCLOPRAMIDE HCL 5 MG/ML IJ SOLN: 5.0000 mg | Freq: Three times a day (TID) | INTRAMUSCULAR | Status: DC | PRN

## 2012-04-29 MED ORDER — STERILE WATER FOR IRRIGATION IR SOLN
Status: DC | PRN
  Administered 2012-04-29: 3000 mL

## 2012-04-29 MED ORDER — DEXAMETHASONE SODIUM PHOSPHATE 10 MG/ML IJ SOLN: 10.0000 mg | Freq: Once | INTRAMUSCULAR | Status: AC

## 2012-04-29 MED ORDER — HYDROMORPHONE HCL PF 1 MG/ML IJ SOLN
0.2500 mg | INTRAMUSCULAR | Status: DC | PRN
  Administered 2012-04-29 (×2): 0.5 mg via INTRAVENOUS

## 2012-04-29 MED ORDER — BISACODYL 10 MG RE SUPP: 10.0000 mg | Freq: Every day | RECTAL | Status: DC | PRN

## 2012-04-29 MED ORDER — PROPRANOLOL HCL 10 MG PO TABS
10.0000 mg | ORAL_TABLET | Freq: Two times a day (BID) | ORAL | Status: DC
  Administered 2012-04-29 – 2012-05-01 (×4): 10 mg via ORAL
  Filled 2012-04-29 (×5): qty 1

## 2012-04-29 MED ORDER — DIAZEPAM 5 MG PO TABS: 2.5000 mg | ORAL_TABLET | ORAL | Status: DC | PRN

## 2012-04-29 MED ORDER — FLEET ENEMA 7-19 GM/118ML RE ENEM: 1.0000 | ENEMA | Freq: Once | RECTAL | Status: AC | PRN

## 2012-04-29 MED ORDER — PHENOL 1.4 % MT LIQD: 1.0000 | OROMUCOSAL | Status: DC | PRN

## 2012-04-29 MED ORDER — DOCUSATE SODIUM 100 MG PO CAPS
100.0000 mg | ORAL_CAPSULE | Freq: Two times a day (BID) | ORAL | Status: DC
  Administered 2012-04-29 – 2012-05-01 (×4): 100 mg via ORAL

## 2012-04-29 MED ORDER — DEXAMETHASONE SODIUM PHOSPHATE 10 MG/ML IJ SOLN
INTRAMUSCULAR | Status: DC | PRN
  Administered 2012-04-29: 10 mg via INTRAVENOUS

## 2012-04-29 MED ORDER — ACETAMINOPHEN 10 MG/ML IV SOLN
1000.0000 mg | Freq: Once | INTRAVENOUS | Status: AC
  Administered 2012-04-29: 1000 mg via INTRAVENOUS

## 2012-04-29 MED ORDER — CEFAZOLIN SODIUM-DEXTROSE 2-3 GM-% IV SOLR
2.0000 g | INTRAVENOUS | Status: AC
  Administered 2012-04-29: 2 g via INTRAVENOUS

## 2012-04-29 MED ORDER — MIDAZOLAM HCL 5 MG/5ML IJ SOLN
INTRAMUSCULAR | Status: DC | PRN
  Administered 2012-04-29: 2 mg via INTRAVENOUS

## 2012-04-29 MED ORDER — DEXAMETHASONE 6 MG PO TABS
10.0000 mg | ORAL_TABLET | Freq: Once | ORAL | Status: AC
  Administered 2012-04-30: 10 mg via ORAL
  Filled 2012-04-29: qty 1

## 2012-04-29 MED ORDER — ONDANSETRON HCL 4 MG/2ML IJ SOLN: 4.0000 mg | Freq: Four times a day (QID) | INTRAMUSCULAR | Status: DC | PRN

## 2012-04-29 MED ORDER — SODIUM CHLORIDE 0.9 % IJ SOLN
INTRAMUSCULAR | Status: DC | PRN
  Administered 2012-04-29: 50 mL

## 2012-04-29 MED ORDER — METOCLOPRAMIDE HCL 10 MG PO TABS: 5.0000 mg | ORAL_TABLET | Freq: Three times a day (TID) | ORAL | Status: DC | PRN

## 2012-04-29 MED ORDER — PHENYLEPHRINE HCL 10 MG/ML IJ SOLN
10.0000 mg | INTRAVENOUS | Status: DC | PRN
  Administered 2012-04-29: 50 ug/min via INTRAVENOUS

## 2012-04-29 SURGICAL SUPPLY — 42 items
BAG SPEC THK2 15X12 ZIP CLS (MISCELLANEOUS) ×2
BAG ZIPLOCK 12X15 (MISCELLANEOUS) ×4 IMPLANT
BLADE SAW SGTL 18X1.27X75 (BLADE) ×2 IMPLANT
CLOTH BEACON ORANGE TIMEOUT ST (SAFETY) ×2 IMPLANT
CLSR STERI-STRIP ANTIMIC 1/2X4 (GAUZE/BANDAGES/DRESSINGS) ×4 IMPLANT
DECANTER SPIKE VIAL GLASS SM (MISCELLANEOUS) ×2 IMPLANT
DRAPE C-ARM 42X72 X-RAY (DRAPES) ×2 IMPLANT
DRAPE STERI IOBAN 125X83 (DRAPES) ×2 IMPLANT
DRAPE U-SHAPE 47X51 STRL (DRAPES) ×6 IMPLANT
DRSG ADAPTIC 3X8 NADH LF (GAUZE/BANDAGES/DRESSINGS) ×2 IMPLANT
DRSG MEPILEX BORDER 4X4 (GAUZE/BANDAGES/DRESSINGS) ×4 IMPLANT
DRSG MEPILEX BORDER 4X8 (GAUZE/BANDAGES/DRESSINGS) ×2 IMPLANT
DURAPREP 26ML APPLICATOR (WOUND CARE) ×2 IMPLANT
ELECT BLADE 6.5 EXT (BLADE) ×2 IMPLANT
ELECT REM PT RETURN 9FT ADLT (ELECTROSURGICAL) ×2
ELECTRODE REM PT RTRN 9FT ADLT (ELECTROSURGICAL) ×1 IMPLANT
EVACUATOR 1/8 PVC DRAIN (DRAIN) ×2 IMPLANT
FACESHIELD LNG OPTICON STERILE (SAFETY) ×8 IMPLANT
GLOVE BIO SURGEON STRL SZ7.5 (GLOVE) ×2 IMPLANT
GLOVE BIO SURGEON STRL SZ8 (GLOVE) ×4 IMPLANT
GLOVE BIOGEL PI IND STRL 8 (GLOVE) ×2 IMPLANT
GLOVE BIOGEL PI INDICATOR 8 (GLOVE) ×2
GOWN STRL NON-REIN LRG LVL3 (GOWN DISPOSABLE) ×2 IMPLANT
GOWN STRL REIN XL XLG (GOWN DISPOSABLE) ×2 IMPLANT
KIT BASIN OR (CUSTOM PROCEDURE TRAY) ×2 IMPLANT
NDL SAFETY ECLIPSE 18X1.5 (NEEDLE) ×1 IMPLANT
NEEDLE HYPO 18GX1.5 SHARP (NEEDLE) ×1
PACK TOTAL JOINT (CUSTOM PROCEDURE TRAY) ×2 IMPLANT
PADDING CAST COTTON 6X4 STRL (CAST SUPPLIES) ×2 IMPLANT
SPONGE GAUZE 4X4 12PLY (GAUZE/BANDAGES/DRESSINGS) ×2 IMPLANT
STRIP CLOSURE SKIN 1/2X4 (GAUZE/BANDAGES/DRESSINGS) ×2 IMPLANT
SUCTION FRAZIER 12FR DISP (SUCTIONS) ×2 IMPLANT
SUT ETHIBOND NAB CT1 #1 30IN (SUTURE) ×6 IMPLANT
SUT MNCRL AB 4-0 PS2 18 (SUTURE) ×2 IMPLANT
SUT VIC AB 1 CT1 27 (SUTURE) ×2
SUT VIC AB 1 CT1 27XBRD ANTBC (SUTURE) ×1 IMPLANT
SUT VIC AB 2-0 CT1 27 (SUTURE) ×4
SUT VIC AB 2-0 CT1 TAPERPNT 27 (SUTURE) ×2 IMPLANT
SUT VLOC 180 0 24IN GS25 (SUTURE) ×2 IMPLANT
SYR 50ML LL SCALE MARK (SYRINGE) ×2 IMPLANT
TOWEL OR 17X26 10 PK STRL BLUE (TOWEL DISPOSABLE) ×4 IMPLANT
TRAY FOLEY CATH 14FRSI W/METER (CATHETERS) ×2 IMPLANT

## 2012-04-29 NOTE — Interval H&P Note (Signed)
History and Physical Interval Note:  04/29/2012 2:18 PM  Erica Alvarez  has presented today for surgery, with the diagnosis of oa right hip   The various methods of treatment have been discussed with the patient and family. After consideration of risks, benefits and other options for treatment, the patient has consented to  Procedure(s): TOTAL HIP ARTHROPLASTY ANTERIOR APPROACH (Right) as a surgical intervention .  The patient's history has been reviewed, patient examined, no change in status, stable for surgery.  I have reviewed the patient's chart and labs.  Questions were answered to the patient's satisfaction.     Loanne Drilling

## 2012-04-29 NOTE — Transfer of Care (Signed)
Immediate Anesthesia Transfer of Care Note  Patient: Erica Alvarez  Procedure(s) Performed: Procedure(s) (LRB): TOTAL HIP ARTHROPLASTY ANTERIOR APPROACH (Right)  Patient Location: PACU  Anesthesia Type: Spinal  Level of Consciousness: sedated, patient cooperative and responds to stimulaton  Airway & Oxygen Therapy: Patient Spontanous Breathing and Patient connected to face mask oxgen  Post-op Assessment: Report given to PACU RN and Post -op Vital signs reviewed and stable  Post vital signs: Reviewed and stable  Complications: No apparent anesthesia complications

## 2012-04-29 NOTE — Anesthesia Postprocedure Evaluation (Signed)
Anesthesia Post Note  Patient: Erica Alvarez  Procedure(s) Performed: Procedure(s) (LRB): TOTAL HIP ARTHROPLASTY ANTERIOR APPROACH (Right)  Anesthesia type: General  Patient location: PACU  Post pain: Pain level controlled  Post assessment: Post-op Vital signs reviewed  Last Vitals:  Filed Vitals:   04/29/12 1700  BP: 134/66  Pulse: 66  Temp: 36.6 C  Resp: 13    Post vital signs: Reviewed  Level of consciousness: sedated  Complications: No apparent anesthesia complications

## 2012-04-29 NOTE — Preoperative (Signed)
Beta Blockers   Reason not to administer Beta Blockers:Not Applicable 

## 2012-04-29 NOTE — Op Note (Signed)
OPERATIVE REPORT  PREOPERATIVE DIAGNOSIS: Osteoarthritis of the Right hip.   POSTOPERATIVE DIAGNOSIS: Osteoarthritis of the Right  hip.   PROCEDURE: Right total hip arthroplasty, anterior approach.   SURGEON: Ollen Gross, MD   ASSISTANT: Avel Peace, PA-C  ANESTHESIA:  Spinal  ESTIMATED BLOOD LOSS:- 250 ml  DRAINS: Hemovac x1.   COMPLICATIONS: None   CONDITION: PACU - hemodynamically stable.   BRIEF CLINICAL NOTE: Erica Alvarez is a 67 y.o. female who has advanced end-  stage arthritis of her Right  hip with progressively worsening pain and  dysfunction.The patient has failed nonoperative management and presents for  total hip arthroplasty.   PROCEDURE IN DETAIL: After successful administration of spinal  anesthetic, the traction boots for the [facility] bed were placed on both  feet and the patient was placed onto the [facility] bed, boots placed into the leg  holders. The Right hip was then isolated from the perineum with plastic  drapes and prepped and draped in the usual sterile fashion. ASIS and  greater trochanter were marked and a oblique incision was made, starting  at about 1 cm lateral and 2 cm distal to the ASIS and coursing towards  the anterior cortex of the femur. The skin was cut with a 10 blade  through subcutaneous tissue to the level of the fascia overlying the  tensor fascia lata muscle. The fascia was then incised in line with the  incision at the junction of the anterior third and posterior 2/3rd. The  muscle was teased off the fascia and then the interval between the TFL  and the rectus was developed. The Hohmann retractor was then placed at  the top of the femoral neck over the capsule. The vessels overlying the  capsule were cauterized and the fat on top of the capsule was removed.  A Hohmann retractor was then placed anterior underneath the rectus  femoris to give exposure to the entire anterior capsule. A T-shaped  capsulotomy was performed.  The edges were tagged and the femoral head  was identified.       Osteophytes are removed off the superior acetabulum.  The femoral neck was then cut in situ with an oscillating saw. Traction  was then applied to the left lower extremity utilizing the [facility]  traction. The femoral head was then removed. Retractors were placed  around the acetabulum and then circumferential removal of the labrum was  performed. Osteophytes were also removed. Reaming starts at 45 mm to  medialize and  Increased in 2 mm increments to 49 mm. We reamed in  approximately 40 degrees of abduction, 20 degrees anteversion. A 50 mm  pinnacle acetabular shell was then impacted in anatomic position under  fluoroscopic guidance with excellent purchase. We did not need to place  any additional dome screws. A 32mm neutral + 4 marathon liner was then  placed into the acetabular shell.       The femoral lift was then placed along the lateral aspect of the femur  just distal to the vastus ridge. The leg was  externally rotated and capsule  was stripped off the inferior aspect of the femoral neck down to the  level of the lesser trochanter, this was done with electrocautery. The femur was lifted after this was performed. The  leg was then placed and extended in adducted position to essentially delivering the femur. We also removed the capsule superiorly and the  piriformis from the piriformis fossa  to gain excellent exposure of the  proximal femur. Rongeur was used to remove some cancellous bone to get  into the lateral portion of the proximal femur for placement of the  initial starter reamer. The starter broaches was placed  the starter broach  and was shown to go down the center of the canal. Broaching  with the  Corail system was then performed starting at size 8, coursing  Up to size 10. A size 10 had excellent torsional and rotational  and axial stability. The trial standard offset neck was then placed  with a 32 + 1 trial  head. The hip was then reduced. We confirmed that  the stem was in the canal both on AP and lateral x-rays. It also has excellent sizing. The hip was reduced with outstanding stability through full extension, full external rotation,  and then flexion in adduction internal rotation. AP pelvis was taken  and the leg lengths were measured and found to be exactly equal. Hip  was then dislocated again and the femoral head and neck removed. The  femoral broach was removed. Size 10 Corail stem with a standard offset  neck was then impacted into the femur following native anteversion. Has  excellent purchase in the canal. Excellent torsional and rotational and  axial stability. It is confirmed to be in the canal on AP and lateral  fluoroscopic views. The 32 mm + 1  ceramic head was placed and the hip  reduced with outstanding stability. Again AP pelvis was taken and it  confirmed that the leg lengths were equal. The wound was then copiously  irrigated with saline solution and the capsule reattached and repaired  with Ethibond suture.  20 mL of Exparel mixed with 50 mL of saline injected  into the capsule and into the edge of the tensor fascia lata as well as  subcutaneous tissue. The fascia overlying the tensor fascia lata was  then closed with a running #1 V-Loc. Subcu was closed with interrupted  2-0 Vicryl and subcuticular running 4-0 Monocryl. Incision was cleaned  and dried. Steri-Strips and a bulky sterile dressing applied. Hemovac  drain was hooked to suction and then he was awakened and transported to  recovery in stable condition.        Please note that a surgical assistant was a medical necessity for this procedure to perform it in a safe and expeditious manner. Assistant was necessary to provide appropriate retraction of vital neurovascular structures and to prevent femoral fracture and allow for anatomic placement of the prosthesis.  Ollen Gross, M.D.

## 2012-04-29 NOTE — Anesthesia Preprocedure Evaluation (Signed)
Anesthesia Evaluation  Patient identified by MRN, date of birth, ID band Patient awake    Reviewed: Allergy & Precautions, H&P , NPO status , Patient's Chart, lab work & pertinent test results  Airway Mallampati: II TM Distance: >3 FB Neck ROM: Full    Dental no notable dental hx. (+) Teeth Intact and Dental Advisory Given   Pulmonary neg pulmonary ROS, former smoker,  breath sounds clear to auscultation  Pulmonary exam normal       Cardiovascular + Peripheral Vascular Disease Rhythm:Regular Rate:Normal     Neuro/Psych Anxiety  Neuromuscular disease (Benign essential tremors.) CVA, No Residual Symptoms    GI/Hepatic Neg liver ROS, GERD-  ,  Endo/Other  negative endocrine ROS  Renal/GU negative Renal ROS  negative genitourinary   Musculoskeletal negative musculoskeletal ROS (+)   Abdominal   Peds negative pediatric ROS (+)  Hematology negative hematology ROS (+)   Anesthesia Other Findings   Reproductive/Obstetrics negative OB ROS                           Anesthesia Physical Anesthesia Plan  ASA: III  Anesthesia Plan: Spinal   Post-op Pain Management:    Induction:   Airway Management Planned: Simple Face Mask  Additional Equipment:   Intra-op Plan:   Post-operative Plan:   Informed Consent: I have reviewed the patients History and Physical, chart, labs and discussed the procedure including the risks, benefits and alternatives for the proposed anesthesia with the patient or authorized representative who has indicated his/her understanding and acceptance.   Dental advisory given  Plan Discussed with: CRNA  Anesthesia Plan Comments:         Anesthesia Quick Evaluation

## 2012-04-29 NOTE — Anesthesia Procedure Notes (Signed)
Spinal  Patient location during procedure: OR Start time: 04/29/2012 2:20 PM End time: 04/29/2012 2:25 PM Staffing Performed by: anesthesiologist  Preanesthetic Checklist Completed: patient identified, site marked, surgical consent, pre-op evaluation, timeout performed, IV checked, risks and benefits discussed and monitors and equipment checked Spinal Block Patient position: sitting Prep: Betadine Patient monitoring: heart rate, continuous pulse ox and blood pressure Approach: midline Location: L2-3 Injection technique: single-shot Needle Needle type: Sprotte  Needle gauge: 22 G Needle length: 9 cm Additional Notes Expiration date of kit checked and confirmed. Patient tolerated procedure well, without complications. Negative heme/paresthesia Lot [id number] 05/2013

## 2012-04-30 ENCOUNTER — Encounter (HOSPITAL_COMMUNITY): Payer: Self-pay | Admitting: Orthopedic Surgery

## 2012-04-30 LAB — CBC
MCH: 29.3 pg (ref 26.0–34.0)
MCV: 86.9 fL (ref 78.0–100.0)
Platelets: 233 10*3/uL (ref 150–400)
RBC: 3.89 MIL/uL (ref 3.87–5.11)

## 2012-04-30 LAB — BASIC METABOLIC PANEL
CO2: 24 mEq/L (ref 19–32)
Calcium: 8.3 mg/dL — ABNORMAL LOW (ref 8.4–10.5)
Creatinine, Ser: 0.5 mg/dL (ref 0.50–1.10)

## 2012-04-30 MED ORDER — FLUCONAZOLE 150 MG PO TABS
150.0000 mg | ORAL_TABLET | Freq: Once | ORAL | Status: AC
  Administered 2012-04-30: 150 mg via ORAL
  Filled 2012-04-30: qty 1

## 2012-04-30 MED ORDER — VITAMINS A & D EX OINT
TOPICAL_OINTMENT | CUTANEOUS | Status: AC
  Filled 2012-04-30: qty 5

## 2012-04-30 NOTE — Progress Notes (Signed)
Physical Therapy Treatment Patient Details Name: FAYLYNN STAMOS MRN: [id number] DOB: 07/25/1945 Today's Date: 04/30/2012 Time: 1436-1500 PT Time Calculation (min): 24 min  PT Assessment / Plan / Recommendation Comments on Treatment Session  Pt reports being a little more sore this session. Recommend HHPT    Follow Up Recommendations  Home health PT     Does the patient have the potential to tolerate intense rehabilitation     Barriers to Discharge        Equipment Recommendations  None recommended by PT    Recommendations for Other Services    Frequency 7X/week   Plan Discharge plan remains appropriate    Precautions / Restrictions Precautions Precautions: None Restrictions Weight Bearing Restrictions: No RLE Weight Bearing: Weight bearing as tolerated   Pertinent Vitals/Pain 4/10 R hip    Mobility  Bed Mobility Bed Mobility: Supine to Sit;Sit to Supine Supine to Sit: 4: Min assist;4: Min guard Sit to Supine: 4: Min guard Details for Bed Mobility Assistance: Very lilttel assist initially with getting OOB Transfers Transfers: Sit to Stand;Stand to Sit Sit to Stand: From bed;4: Min guard Stand to Sit: 4: Min guard;To bed Details for Transfer Assistance: VCS safety, technique, hand placement.  Ambulation/Gait Ambulation/Gait Assistance: 4: Min guard Ambulation Distance (Feet): 125 Feet Assistive device: Rolling walker Gait Pattern: Step-to pattern;Decreased step length - right;Decreased step length - left;Antalgic;Decreased stride length    Exercises Total Joint Exercises Ankle Circles/Pumps: Both;10 reps;Supine Quad Sets: AROM;Both;10 reps;Supine Short Arc Quad: AROM;Right;10 reps;Supine Heel Slides: AAROM;Right;10 reps;Supine Hip ABduction/ADduction: AAROM;Right;10 reps;Supine   PT Diagnosis:    PT Problem List:   PT Treatment Interventions:     PT Goals Acute Rehab PT Goals Pt will go Supine/Side to Sit: with supervision PT Goal: Supine/Side to Sit -  Progress: Progressing toward goal Pt will go Sit to Supine/Side: with supervision PT Goal: Sit to Supine/Side - Progress: Progressing toward goal Pt will go Sit to Stand: with supervision PT Goal: Sit to Stand - Progress: Progressing toward goal Pt will Ambulate: >150 feet;with supervision;with rolling walker PT Goal: Ambulate - Progress: Progressing toward goal Pt will Perform Home Exercise Program: with supervision, verbal cues required/provided PT Goal: Perform Home Exercise Program - Progress: Progressing toward goal  Visit Information  Last PT Received On: 04/30/12 Assistance Needed: +1    Subjective Data  Subjective: I got a little dizzy a while ago Patient Stated Goal: home/ back to exercising.    Cognition       Balance     End of Session PT - End of Session Activity Tolerance: Patient tolerated treatment well Patient left: in bed;with call bell/phone within reach;with family/visitor present   GP     [name] [facility] 04/30/2012, 4:18 PM [phone]

## 2012-04-30 NOTE — Evaluation (Signed)
Physical Therapy Evaluation Patient Details Name: Erica Alvarez MRN: [id number] DOB: [birth year] Today's Date: 04/30/2012 Time: 0865-7846 PT Time Calculation (min): 20 min  PT Assessment / Plan / Recommendation Clinical Impression  67 yo female s/p R THA-direct anterior. On eval pt required Min assist for bed mobility and min guard assist for ambulation. Anticipate pt will progress well during stay. Recommend HHPT.     PT Assessment  Patient needs continued PT services    Follow Up Recommendations  Home health PT    Does the patient have the potential to tolerate intense rehabilitation      Barriers to Discharge        Equipment Recommendations  None recommended by PT    Recommendations for Other Services     Frequency 7X/week    Precautions / Restrictions Precautions Precautions: None Restrictions Weight Bearing Restrictions: No RLE Weight Bearing: Weight bearing as tolerated   Pertinent Vitals/Pain 2/10 R hip      Mobility  Bed Mobility Bed Mobility: Supine to Sit Supine to Sit: 4: Min assist Details for Bed Mobility Assistance: Assist for R LE off bed.  Transfers Transfers: Sit to Stand;Stand to Sit Sit to Stand: 4: Min assist;From bed;With upper extremity assist Stand to Sit: 4: Min guard assist;To chair/3-in-1;With armrests;With upper extremity assist Details for Transfer Assistance: VCS safety, technique, hand placement. Assist to rise, stabilize.  Ambulation/Gait Ambulation/Gait Assistance: 7: Min guard assist Ambulation Distance (Feet): 125 Feet Assistive device: Rolling walker Ambulation/Gait Assistance Details: VCS safety, sequence. Good gait speed. Pt able to use reciprocal gait pattern towards end of walking distance.  Gait Pattern: Step-to pattern;Step-through pattern;Antalgic;Decreased stride length    Exercises     PT Diagnosis: Difficulty walking;Abnormality of gait;Acute pain  PT Problem List: Decreased strength;Decreased range of  motion;Decreased mobility;Pain PT Treatment Interventions: DME instruction;Gait training;Stair training;Functional mobility training;Therapeutic activities;Therapeutic exercise;Patient/family education   PT Goals Acute Rehab PT Goals PT Goal Formulation: With patient Time For Goal Achievement: 05/07/12 Potential to Achieve Goals: Good Pt will go Supine/Side to Sit: with supervision PT Goal: Supine/Side to Sit - Progress: Goal set today Pt will go Sit to Supine/Side: with supervision PT Goal: Sit to Supine/Side - Progress: Goal set today Pt will go Sit to Stand: with supervision PT Goal: Sit to Stand - Progress: Goal set today Pt will Ambulate: >150 feet;with supervision;with rolling walker PT Goal: Ambulate - Progress: Goal set today Pt will Go Up / Down Stairs: 3-5 stairs;with least restrictive assistive device;with min assist PT Goal: Up/Down Stairs - Progress: Goal set today Pt will Perform Home Exercise Program: with supervision, verbal cues required/provided PT Goal: Perform Home Exercise Program - Progress: Goal set today  Visit Information  Last PT Received On: 04/30/12 Assistance Needed: +1    Subjective Data  Subjective: If I can get through 2 knees, I can get through this Patient Stated Goal: home/ back to exercising.    Prior Functioning  Home Living Lives With: Spouse Type of Home: House Home Access: Stairs to enter Entrance Stairs-Number of Steps: 3 Entrance Stairs-Rails: None Home Layout: Two level;Able to live on main level with bedroom/bathroom Home Adaptive Equipment: Walker - rolling;Tub transfer bench Prior Function Level of Independence: Independent Able to Take Stairs?: Yes Driving: Yes Communication Communication: No difficulties    Cognition  Cognition Overall Cognitive Status: Appears within functional limits for tasks assessed/performed Arousal/Alertness: Awake/alert Orientation Level: Appears intact for tasks assessed Behavior During  Session: [facility] for tasks performed    Extremity/Trunk Assessment  Right Lower Extremity Assessment RLE ROM/Strength/Tone: Deficits RLE ROM/Strength/Tone Deficits: hip flex 2/5, hip abd/add 2/5, moves ankle well Left Lower Extremity Assessment LLE ROM/Strength/Tone: WFL for tasks assessed Trunk Assessment Trunk Assessment: Normal   Balance    End of Session PT - End of Session Equipment Utilized During Treatment: Gait belt Activity Tolerance: Patient tolerated treatment well Patient left: in chair;with call bell/phone within reach  GP     [name] [facility] 04/30/2012, 10:54 AM [phone]

## 2012-04-30 NOTE — Progress Notes (Signed)
Utilization review completed.  

## 2012-04-30 NOTE — Evaluation (Signed)
Occupational Therapy Evaluation Patient Details Name: Erica Alvarez MRN: [id number] DOB: 12/10/1945 Today's Date: 04/30/2012 Time: 9528-4132 OT Time Calculation (min): 25 min  OT Assessment / Plan / Recommendation Clinical Impression  Pt presents to OT s.p THA. Pt will benefit from OT to increase I with ADL actvity and return to PLOF    OT Assessment  Patient needs continued OT Services    Follow Up Recommendations  No OT follow up             Frequency  Min 3X/week    Precautions / Restrictions Precautions Precautions: None Restrictions Weight Bearing Restrictions: No RLE Weight Bearing: Weight bearing as tolerated       ADL  Grooming: Performed;Wash/dry hands Where Assessed - Grooming: Unsupported standing Upper Body Bathing: Simulated;Set up Where Assessed - Upper Body Bathing: Unsupported sitting Lower Body Bathing: Simulated;Minimal assistance Where Assessed - Lower Body Bathing: Unsupported sit to stand Upper Body Dressing: Simulated;Set up Where Assessed - Upper Body Dressing: Unsupported sit to stand Lower Body Dressing: Performed;Moderate assistance Where Assessed - Lower Body Dressing: Unsupported sit to stand Toilet Transfer: Performed;Minimal assistance Toilet Transfer Method: Sit to stand Toilet Transfer Equipment: Bedside commode Toileting - Clothing Manipulation and Hygiene: Minimal assistance Where Assessed - Glass blower/designer Manipulation and Hygiene: Standing    OT Diagnosis: Generalized weakness  OT Problem List: Decreased strength OT Treatment Interventions: Self-care/ADL training;Patient/family education   OT Goals Acute Rehab OT Goals OT Goal Formulation: With patient ADL Goals Pt Will Perform Lower Body Dressing: with modified independence;Sit to stand from chair ADL Goal: Lower Body Dressing - Progress: Goal set today Pt Will Transfer to Toilet: Comfort height toilet ADL Goal: Toilet Transfer - Progress: Goal set today  Visit  Information  Last OT Received On: 04/30/12 Assistance Needed: +1       Prior Functioning     Home Living Lives With: Spouse Type of Home: House Home Access: Stairs to enter [employer] of Steps: 3 Entrance Stairs-Rails: None Home Layout: Two level;Able to live on main level with bedroom/bathroom Home Adaptive Equipment: Walker - rolling;Tub transfer bench Prior Function Level of Independence: Independent Able to Take Stairs?: Yes Driving: Yes Communication Communication: No difficulties         Vision/Perception Vision - History Patient Visual Report: No change from baseline   Cognition  Cognition Overall Cognitive Status: Appears within functional limits for tasks assessed/performed Arousal/Alertness: Awake/alert Orientation Level: Appears intact for tasks assessed Behavior During Session: [facility] for tasks performed    Extremity/Trunk Assessment Right Upper Extremity Assessment RUE ROM/Strength/Tone: Within functional levels Left Upper Extremity Assessment LUE ROM/Strength/Tone: Within functional levels Right Lower Extremity Assessment RLE ROM/Strength/Tone: Deficits RLE ROM/Strength/Tone Deficits: hip flex 2/5, hip abd/add 2/5, moves ankle well Left Lower Extremity Assessment LLE ROM/Strength/Tone: WFL for tasks assessed Trunk Assessment Trunk Assessment: Normal     Mobility Bed Mobility Bed Mobility: Supine to Sit Supine to Sit: 4: Min assist Details for Bed Mobility Assistance: Assist for R LE off bed.  Transfers Sit to Stand: 4: Min assist;From bed;With upper extremity assist Stand to Sit: 4: Min assist;To chair/3-in-1;With armrests;With upper extremity assist Details for Transfer Assistance: VCS safety, technique, hand placement. Assist to rise, stabilize.            End of Session OT - End of Session Activity Tolerance: Patient tolerated treatment well Patient left: in bed;with call bell/phone within reach  GO     [facility], [place] 04/30/2012, 12:09 PM

## 2012-04-30 NOTE — Progress Notes (Signed)
   Subjective: 1 Day Post-Op Procedure(s) (LRB): TOTAL HIP ARTHROPLASTY ANTERIOR APPROACH (Right) Patient reports pain as mild.   Patient seen in rounds with Dr. Lequita Halt. Patient doing okay.  Patient is well, and has had no acute complaints or problems We will start therapy today.  Plan is to go Home after hospital stay.  Objective: Vital signs in last 24 hours: Temp:  [97.7 F (36.5 C)-98.6 F (37 C)] 98 F (36.7 C) (02/27 0620) Pulse Rate:  [62-80] 67 (02/27 0620) Resp:  [7-18] 16 (02/27 0620) BP: (105-152)/(64-89) 105/64 mmHg (02/27 0620) SpO2:  [97 %-100 %] 98 % (02/27 0620) Weight:  [72.122 kg (159 lb)] 72.122 kg (159 lb) (02/26 1730)  Intake/Output from previous day:  Intake/Output Summary (Last 24 hours) at 04/30/12 0731 Last data filed at 04/30/12 1610  Gross per 24 hour  Intake   4115 ml  Output   2035 ml  Net   2080 ml    Intake/Output this shift:    Labs:  Recent Labs  04/30/12 0440  HGB 11.4*    Recent Labs  04/30/12 0440  WBC 19.2*  RBC 3.89  HCT 33.8*  PLT 233    Recent Labs  04/30/12 0440  NA 138  K 4.0  CL 105  CO2 24  BUN 10  CREATININE 0.50  GLUCOSE 184*  CALCIUM 8.3*   No results found for this basename: LABPT, INR,  in the last 72 hours  EXAM General - Patient is Alert, Appropriate and Oriented Extremity - Neurovascular intact Sensation intact distally Dorsiflexion/Plantar flexion intact Dressing - dressing C/D/I Motor Function - intact, moving foot and toes well on exam.  Hemovac pulled without difficulty.  Past Medical History  Diagnosis Date  . Arthritis   . Stroke     2007 - no deficits per patient  . GERD (gastroesophageal reflux disease)   . Cancer     basal cell cancer on nose   . Neuromuscular disorder     benign essential tremors - reason for Inderal   . Anxiety     occasional  . Peripheral vascular disease 2003    DVT  left leg post op    Assessment/Plan: 1 Day Post-Op Procedure(s) (LRB): TOTAL  HIP ARTHROPLASTY ANTERIOR APPROACH (Right) Principal Problem:   OA (osteoarthritis) of hip  Estimated body mass index is 31.05 kg/(m^2) as calculated from the following:   Height as of this encounter: 5' (1.524 m).   Weight as of this encounter: 72.122 kg (159 lb). Advance diet Up with therapy Plan for discharge tomorrow Discharge home with home health  DVT Prophylaxis - Xarelto, ASA 81 mg on hold Weight Bearing As Tolerated right Leg Hemovac Pulled Begin Therapy No vaccines.  Patrica Duel 04/30/2012, 7:31 AM

## 2012-05-01 LAB — BASIC METABOLIC PANEL
BUN: 12 mg/dL (ref 6–23)
Chloride: 106 mEq/L (ref 96–112)
Creatinine, Ser: 0.52 mg/dL (ref 0.50–1.10)
GFR calc Af Amer: >90 mL/min (ref >90)

## 2012-05-01 LAB — CBC
HCT: 32 % — ABNORMAL LOW (ref 36.0–46.0)
MCV: 86.7 fL (ref 78.0–100.0)
RDW: 14.6 % (ref 11.5–15.5)
WBC: 27.8 10*3/uL — ABNORMAL HIGH (ref 4.0–10.5)

## 2012-05-01 MED ORDER — RIVAROXABAN 10 MG PO TABS: 10.0000 mg | ORAL_TABLET | Freq: Every day | ORAL | Status: DC

## 2012-05-01 MED ORDER — TRAMADOL HCL 50 MG PO TABS: 50.0000 mg | ORAL_TABLET | Freq: Four times a day (QID) | ORAL | Status: DC | PRN

## 2012-05-01 MED ORDER — METHOCARBAMOL 500 MG PO TABS: 500.0000 mg | ORAL_TABLET | Freq: Four times a day (QID) | ORAL | Status: DC | PRN

## 2012-05-01 NOTE — Progress Notes (Signed)
Physical Therapy Treatment Patient Details Name: Erica Alvarez MRN: [id number] DOB: 11-01-1945 Today's Date: 05/01/2012 Time: 4098-1191 PT Time Calculation (min): 24 min  PT Assessment / Plan / Recommendation Comments on Treatment Session  Pt mobilizing very well. Some difficulty ascending steps but pt was able with +1 assist. Instructed pt to perform exercises one more time this evening at home. Recommend HHPT. All education completed.     Follow Up Recommendations  Home health PT     Does the patient have the potential to tolerate intense rehabilitation     Barriers to Discharge        Equipment Recommendations  None recommended by PT    Recommendations for Other Services    Frequency 7X/week   Plan Discharge plan remains appropriate    Precautions / Restrictions Precautions Precautions: None Restrictions Weight Bearing Restrictions: No RLE Weight Bearing: Weight bearing as tolerated   Pertinent Vitals/Pain 4/10 R hip after steps    Mobility  Bed Mobility Bed Mobility: Not assessed Supine to Sit: 5: Supervision Sit to Supine: 5: Supervision Details for Bed Mobility Assistance: pt sitting in chair Transfers Transfers: Sit to Stand;Stand to Sit Sit to Stand: 5: Supervision;From chair/3-in-1 Stand to Sit: 5: Supervision;To chair/3-in-1 Details for Transfer Assistance: VCS safety, technique, hand placement.  Ambulation/Gait Ambulation Distance (Feet): 275 Feet Assistive device: Rolling walker Ambulation/Gait Assistance Details: VCs safety, pacing.  Gait Pattern: Step-through pattern;Decreased stride length Stairs: Yes Stairs Assistance: 4: Min assist Stairs Assistance Details (indicate cue type and reason): VCS safety, technique, sequence. Pt stats she has chest to hold onto on one side. Recommended pt use chest for support on one side and husband's arm for support on other side. Increased difficulty ascending compared to descending steps.  Stair Management  Technique: Forwards;Step to pattern (1 handheld support) Number of Stairs: 4    Exercises Total Joint Exercises Ankle Circles/Pumps: AROM;Both;10 reps;Seated Quad Sets: AROM;Both;10 reps;Seated Short Arc Quad: AROM;Right;10 reps;Seated Heel Slides: AAROM;Right;10 reps;Seated;AROM Hip ABduction/ADduction: AAROM;AROM;Right;10 reps;Seated Long Arc Quad: AROM;Right;10 reps;Seated   PT Diagnosis:    PT Problem List:   PT Treatment Interventions:     PT Goals Acute Rehab PT Goals Pt will go Sit to Stand: with supervision PT Goal: Sit to Stand - Progress: Met Pt will Ambulate: >150 feet;with supervision;with rolling walker PT Goal: Ambulate - Progress: Met Pt will Go Up / Down Stairs: 3-5 stairs;with least restrictive assistive device;with min assist PT Goal: Up/Down Stairs - Progress: Met Pt will Perform Home Exercise Program: with supervision, verbal cues required/provided PT Goal: Perform Home Exercise Program - Progress: Progressing toward goal  Visit Information  Last PT Received On: 05/01/12 Assistance Needed: +1    Subjective Data  Subjective: I can tell I've had surgery now (after steps) Patient Stated Goal: home/ back to exercising.    Cognition  Cognition Overall Cognitive Status: Appears within functional limits for tasks assessed/performed Arousal/Alertness: Awake/alert Orientation Level: Appears intact for tasks assessed Behavior During Session: [facility] for tasks performed    Balance     End of Session PT - End of Session Activity Tolerance: Patient tolerated treatment well Patient left: in chair;with call bell/phone within reach   GP     Erica Alvarez 05/01/2012, 9:24 AM [phone]

## 2012-05-01 NOTE — Progress Notes (Signed)
Occupational Therapy Treatment Patient Details Name: Erica Alvarez MRN: [id number] DOB: 05-18-1945 Today's Date: 05/01/2012 Time: 4401-0272 OT Time Calculation (min): 29 min  OT Assessment / Plan / Recommendation Comments on Treatment Session      Follow Up Recommendations  No OT follow up       Equipment Recommendations  None recommended by OT          Plan Discharge plan remains appropriate    Precautions / Restrictions Restrictions Weight Bearing Restrictions: No RLE Weight Bearing: Weight bearing as tolerated       ADL  Lower Body Dressing: Performed;Min guard (with AE) Toilet Transfer: Performed;Supervision/safety Toilet Transfer Method: Sit to Barista: Bedside commode (walk to bathroom) Toileting - Architect and Hygiene: Performed;Supervision/safety Where Assessed - Toileting Clothing Manipulation and Hygiene: Standing Transfers/Ambulation Related to ADLs: Pts husband will obtain AE.      OT Goals ADL Goals ADL Goal: Lower Body Dressing - Progress: Progressing toward goals ADL Goal: Toilet Transfer - Progress: Progressing toward goals  Visit Information  Last OT Received On: 05/01/12 Assistance Needed: +1    Subjective Data  Subjective: i slept great         Mobility  Bed Mobility Supine to Sit: 5: Supervision Sit to Supine: 5: Supervision Transfers Transfers: Sit to Stand;Stand to Sit Sit to Stand: 5: Supervision;From bed;From chair/3-in-1;From toilet Stand to Sit: 5: Supervision;To toilet          End of Session OT - End of Session Patient left: in chair;with call bell/phone within reach  GO     [facility], [place] 05/01/2012, 8:41 AM

## 2012-05-01 NOTE — Discharge Summary (Signed)
Physician Discharge Summary   Patient ID: Erica Alvarez MRN: [id number] DOB/AGE: 1945/09/25 67 y.o.  Admit date: 04/29/2012 Discharge date: 05/01/2012  Primary Diagnosis:  Osteoarthritis of the Right hip.   Admission Diagnoses:  Past Medical History  Diagnosis Date  . Arthritis   . Stroke     2007 - no deficits per patient  . GERD (gastroesophageal reflux disease)   . Cancer     basal cell cancer on nose   . Neuromuscular disorder     benign essential tremors - reason for Inderal   . Anxiety     occasional  . Peripheral vascular disease 2003    DVT  left leg post op   Discharge Diagnoses:   Principal Problem:   OA (osteoarthritis) of hip  Estimated body mass index is 31.05 kg/(m^2) as calculated from the following:   Height as of this encounter: 5' (1.524 m).   Weight as of this encounter: 72.122 kg (159 lb).  Classification of overweight in adults according to BMI (WHO, 1998)   Procedure: Procedure(s) (LRB): TOTAL HIP ARTHROPLASTY ANTERIOR APPROACH (Right)   Consults: None  HPI: Erica Alvarez is a 67 y.o. female who has advanced end-  stage arthritis of her Right hip with progressively worsening pain and  dysfunction.The patient has failed nonoperative management and presents for  total hip arthroplasty.   Laboratory Data: Admission on 04/29/2012  Component Date Value Range Status  . WBC 04/30/2012 19.2* 4.0 - 10.5 K/uL Final  . RBC 04/30/2012 3.89  3.87 - 5.11 MIL/uL Final  . Hemoglobin 04/30/2012 11.4* 12.0 - 15.0 g/dL Final  . HCT 81/19/1478 33.8* 36.0 - 46.0 % Final  . MCV 04/30/2012 86.9  78.0 - 100.0 fL Final  . MCH 04/30/2012 29.3  26.0 - 34.0 pg Final  . MCHC 04/30/2012 33.7  30.0 - 36.0 g/dL Final  . RDW 29/56/2130 14.5  11.5 - 15.5 % Final  . Platelets 04/30/2012 233  150 - 400 K/uL Final  . Sodium 04/30/2012 138  135 - 145 mEq/L Final  . Potassium 04/30/2012 4.0  3.5 - 5.1 mEq/L Final  . Chloride 04/30/2012 105  96 - 112 mEq/L Final  . CO2  04/30/2012 24  19 - 32 mEq/L Final  . Glucose, Bld 04/30/2012 184* 70 - 99 mg/dL Final  . BUN 86/57/8469 10  6 - 23 mg/dL Final  . Creatinine, Ser 04/30/2012 0.50  0.50 - 1.10 mg/dL Final  . Calcium 62/95/2841 8.3* 8.4 - 10.5 mg/dL Final  . GFR calc non Af Amer 04/30/2012 >90  >90 mL/min Final  . GFR calc Af Amer 04/30/2012 >90  >90 mL/min Final   Comment:                                 The eGFR has been calculated                          using the CKD EPI equation.                          This calculation has not been                          validated in all clinical  situations.                          eGFR's persistently                          <90 mL/min signify                          possible Chronic Kidney Disease.  . WBC 05/01/2012 27.8* 4.0 - 10.5 K/uL Final  . RBC 05/01/2012 3.69* 3.87 - 5.11 MIL/uL Final  . Hemoglobin 05/01/2012 10.4* 12.0 - 15.0 g/dL Final  . HCT 16/12/9602 32.0* 36.0 - 46.0 % Final  . MCV 05/01/2012 86.7  78.0 - 100.0 fL Final  . MCH 05/01/2012 28.2  26.0 - 34.0 pg Final  . MCHC 05/01/2012 32.5  30.0 - 36.0 g/dL Final  . RDW 54/11/8117 14.6  11.5 - 15.5 % Final  . Platelets 05/01/2012 255  150 - 400 K/uL Final  . Sodium 05/01/2012 140  135 - 145 mEq/L Final  . Potassium 05/01/2012 3.8  3.5 - 5.1 mEq/L Final  . Chloride 05/01/2012 106  96 - 112 mEq/L Final  . CO2 05/01/2012 25  19 - 32 mEq/L Final  . Glucose, Bld 05/01/2012 147* 70 - 99 mg/dL Final  . BUN 14/78/2956 12  6 - 23 mg/dL Final  . Creatinine, Ser 05/01/2012 0.52  0.50 - 1.10 mg/dL Final  . Calcium 21/30/8657 8.7  8.4 - 10.5 mg/dL Final  . GFR calc non Af Amer 05/01/2012 >90  >90 mL/min Final  . GFR calc Af Amer 05/01/2012 >90  >90 mL/min Final   Comment:                                 The eGFR has been calculated                          using the CKD EPI equation.                          This calculation has not been                          validated in  all clinical                          situations.                          eGFR's persistently                          <90 mL/min signify                          possible Chronic Kidney Disease.  Hospital Outpatient Visit on 04/24/2012  Component Date Value Range Status  . aPTT 04/24/2012 31  24 - 37 seconds Final  . WBC 04/24/2012 11.5* 4.0 - 10.5 K/uL Final  . RBC 04/24/2012 4.99  3.87 - 5.11 MIL/uL Final  . Hemoglobin 04/24/2012 13.8  12.0 - 15.0 g/dL Final  . HCT 84/69/6295 43.1  36.0 - 46.0 % Final  .  MCV 04/24/2012 86.4  78.0 - 100.0 fL Final  . MCH 04/24/2012 27.7  26.0 - 34.0 pg Final  . MCHC 04/24/2012 32.0  30.0 - 36.0 g/dL Final  . RDW 16/12/9602 15.1  11.5 - 15.5 % Final  . Platelets 04/24/2012 292  150 - 400 K/uL Final  . Sodium 04/24/2012 137  135 - 145 mEq/L Final  . Potassium 04/24/2012 4.1  3.5 - 5.1 mEq/L Final  . Chloride 04/24/2012 101  96 - 112 mEq/L Final  . CO2 04/24/2012 27  19 - 32 mEq/L Final  . Glucose, Bld 04/24/2012 99  70 - 99 mg/dL Final  . BUN 54/11/8117 15  6 - 23 mg/dL Final  . Creatinine, Ser 04/24/2012 0.70  0.50 - 1.10 mg/dL Final  . Calcium 14/78/2956 9.4  8.4 - 10.5 mg/dL Final  . Total Protein 04/24/2012 6.7  6.0 - 8.3 g/dL Final  . Albumin 21/30/8657 3.7  3.5 - 5.2 g/dL Final  . AST 84/69/6295 13  0 - 37 U/L Final  . ALT 04/24/2012 11  0 - 35 U/L Final  . Alkaline Phosphatase 04/24/2012 71  39 - 117 U/L Final  . Total Bilirubin 04/24/2012 0.4  0.3 - 1.2 mg/dL Final  . GFR calc non Af Amer 04/24/2012 88* >90 mL/min Final  . GFR calc Af Amer 04/24/2012 >90  >90 mL/min Final   Comment:                                 The eGFR has been calculated                          using the CKD EPI equation.                          This calculation has not been                          validated in all clinical                          situations.                          eGFR's persistently                          <90 mL/min signify                           possible Chronic Kidney Disease.  Marland Kitchen Prothrombin Time 04/24/2012 12.8  11.6 - 15.2 seconds Final  . INR 04/24/2012 0.97  0.00 - 1.49 Final  . ABO/RH(D) 04/24/2012 B NEG   Final  . Antibody Screen 04/24/2012 NEG   Final  . Sample Expiration 04/24/2012 05/02/2012   Final  . Color, Urine 04/24/2012 YELLOW  YELLOW Final  . APPearance 04/24/2012 CLOUDY* CLEAR Final  . Specific Gravity, Urine 04/24/2012 1.018  1.005 - 1.030 Final  . pH 04/24/2012 5.5  5.0 - 8.0 Final  . Glucose, UA 04/24/2012 NEGATIVE  NEGATIVE mg/dL Final  . Hgb urine dipstick 04/24/2012 NEGATIVE  NEGATIVE Final  . Bilirubin Urine 04/24/2012 NEGATIVE  NEGATIVE Final  . Ketones, ur 04/24/2012 NEGATIVE  NEGATIVE  mg/dL Final  . Protein, ur 96/06/5407 NEGATIVE  NEGATIVE mg/dL Final  . Urobilinogen, UA 04/24/2012 0.2  0.0 - 1.0 mg/dL Final  . Nitrite 81/19/1478 NEGATIVE  NEGATIVE Final  . Leukocytes, UA 04/24/2012 MODERATE* NEGATIVE Final  . MRSA, PCR 04/24/2012 NEGATIVE  NEGATIVE Final  . Staphylococcus aureus 04/24/2012 NEGATIVE  NEGATIVE Final   Comment:                                 The Xpert SA Assay (FDA                          approved for NASAL specimens                          in patients over 35 years of age),                          is one component of                          a comprehensive surveillance                          program.  Test performance has                          been validated by [employer] for patients greater                          than or equal to 40 year old.                          It is not intended                          to diagnose infection nor to                          guide or monitor treatment.  Marland Kitchen Specimen Description 04/24/2012 URINE, CLEAN CATCH   Final  . Special Requests 04/24/2012 NONE   Final  . Culture  Setup Time 04/24/2012 04/24/2012 15:20   Final  . Colony Count 04/24/2012 5,000 COLONIES/ML   Final  . Culture  04/24/2012 INSIGNIFICANT GROWTH   Final  . Report Status 04/24/2012 04/25/2012 FINAL   Final  . Squamous Epithelial / LPF 04/24/2012 MANY* RARE Final  . WBC, UA 04/24/2012 7-10  <3 WBC/hpf Final  . RBC / HPF 04/24/2012 0-2  <3 RBC/hpf Final  . Bacteria, UA 04/24/2012 MANY* RARE Final  . Casts 04/24/2012 GRANULAR CAST* NEGATIVE Final     X-Rays:Dg Hip Complete Right  04/24/2012  *RADIOLOGY REPORT*  Clinical Data: Right hip pain.  Osteoarthritis.  RIGHT HIP - COMPLETE 2+ VIEW  Comparison: MRI right hip 08/09/2009.  Findings: Both hips are located.  There is near bone-on-bone joint space on the right hip consistent with osteoarthritis.  Subchondral sclerosis and cyst formation are also identified.  No evidence of  avascular necrosis is seen.  There is no fracture.  IMPRESSION:  1.  No acute finding. 2.  Right hip osteoarthritis.   Original Report Authenticated By: Holley Dexter, M.D.    Dg Pelvis Portable  04/29/2012  *RADIOLOGY REPORT*  Clinical Data: Right hip arthroplasty.  PORTABLE PELVIS  Comparison: None  Findings: Right total hip arthroplasty identified without complicating features. There is no evidence of fracture, subluxation or dislocation. Overlying drain is noted.  IMPRESSION: Right total hip arthroplasty without complicating features.   Original Report Authenticated By: Harmon Pier, M.D.    Dg Hip Portable 1 View Right  04/29/2012  *RADIOLOGY REPORT*  Clinical Data: Postop right hip arthroplasty.  PORTABLE RIGHT HIP - 1 VIEW  Comparison: 04/24/2012.  AP view of the pelvis 05/01/2012.  Findings: Cross-table lateral view demonstrates changes of right hip replacement.  Normal alignment.  No visible complicating feature.  IMPRESSION: Right hip replacement.  No visible complicating feature.   Original Report Authenticated By: Charlett Nose, M.D.    Dg C-arm 1-60 Min-no Report  04/29/2012  CLINICAL DATA: right total hip   C-ARM 1-60 MINUTES  Fluoroscopy was utilized by the requesting  physician.  No radiographic  interpretation.      EKG: Orders placed during the hospital encounter of 12/10/11  . EKG 12-LEAD  . EKG 12-LEAD  . EKG     Hospital Course: Patient was admitted to [facility] and taken to the OR and underwent the above state procedure without complications.  Patient tolerated the procedure well and was later transferred to the recovery room and then to the orthopaedic floor for postoperative care.  They were given PO and IV analgesics for pain control following their surgery.  They were given 24 hours of postoperative antibiotics of  Anti-infectives   Start     Dose/Rate Route Frequency Ordered Stop   04/30/12 0800  fluconazole (DIFLUCAN) tablet 150 mg     150 mg Oral  Once 04/30/12 0734 04/30/12 0956   04/29/12 2030  ceFAZolin (ANCEF) IVPB 1 g/50 mL premix    Comments:  AET:  2/26 2 1617.  Pre-op antibiotic given @ 1430   1 g 100 mL/hr over 30 Minutes Intravenous Every 6 hours 04/29/12 1744 04/30/12 0258   04/29/12 1100  ceFAZolin (ANCEF) IVPB 2 g/50 mL premix     2 g 100 mL/hr over 30 Minutes Intravenous On call to O.R. 04/29/12 1050 04/29/12 1430     and started on DVT prophylaxis in the form of Xarelto.   PT and OT were ordered for total hip protocol.  The patient was allowed to be WBAT with therapy. Discharge planning was consulted to help with postop disposition and equipment needs.  Patient had a good night on the evening of surgery.  They started to get up OOB with therapy on day one.  Hemovac drain was pulled without difficulty.  The knee immobilizer was removed and discontinued.  Continued to work with therapy into day two.  Dressing was changed on day two and the incision was healing well. Patient was seen in rounds and was ready to go home later that same day.   Discharge Medications: Prior to Admission medications   Medication Sig Start Date End Date Taking? Authorizing Provider  acetaminophen (TYLENOL) 500 MG tablet Take 500 mg by  mouth every 6 (six) hours as needed. For pain   Yes Historical Provider, MD  diazepam (VALIUM) 5 MG tablet Take 2.5 mg by mouth as needed for anxiety.  Yes Historical Provider, MD  propranolol (INDERAL) 10 MG tablet Take 10 mg by mouth 2 (two) times daily.   Yes Historical Provider, MD  methocarbamol (ROBAXIN) 500 MG tablet Take 1 tablet (500 mg total) by mouth every 6 (six) hours as needed. 05/01/12   Labaron Digirolamo Julien Girt, PA  rivaroxaban (XARELTO) 10 MG TABS tablet Take 1 tablet (10 mg total) by mouth daily with breakfast. Take Xarelto for two and a half more weeks, then discontinue Xarelto. Once the patient has completed the Xarelto, they may resume the 81 mg Aspirin. 05/01/12   Ariyona Eid Julien Girt, PA  traMADol (ULTRAM) 50 MG tablet Take 1-2 tablets (50-100 mg total) by mouth every 6 (six) hours as needed. 05/01/12   Marayah Higdon Julien Girt, PA    Diet: Cardiac diet Activity:WBAT Follow-up:in 2 weeks Disposition - Home Discharged Condition: good   Discharge Orders   Future Orders Complete By Expires     Call MD / Call 911  As directed     Comments:      If you experience chest pain or shortness of breath, CALL 911 and be transported to the hospital emergency room.  If you develope a fever above 101 F, pus (white drainage) or increased drainage or redness at the wound, or calf pain, call your surgeon's office.    Change dressing  As directed     Comments:      You may change your dressing dressing daily with sterile 4 x 4 inch gauze dressing and paper tape.  Do not submerge the incision under water.    Constipation Prevention  As directed     Comments:      Drink plenty of fluids.  Prune juice may be helpful.  You may use a stool softener, such as Colace (over the counter) 100 mg twice a day.  Use MiraLax (over the counter) for constipation as needed.    Diet - low sodium heart healthy  As directed     Diet Carb Modified  As directed     Discharge instructions  As directed     Comments:       Pick up stool softner and laxative for home. Do not submerge incision under water. May shower. Continue to use ice for pain and swelling from surgery. Total Hip Protocol.  Take Xarelto for two and a half more weeks, then discontinue Xarelto. Once the patient has completed the Xarelto, they may resume the 81 mg Aspirin.    Do not sit on low chairs, stoools or toilet seats, as it may be difficult to get up from low surfaces  As directed     Driving restrictions  As directed     Comments:      No driving until released by the physician.    Increase activity slowly as tolerated  As directed     Lifting restrictions  As directed     Comments:      No lifting until released by the physician.    Patient may shower  As directed     Comments:      You may shower without a dressing once there is no drainage.  Do not wash over the wound.  If drainage remains, do not shower until drainage stops.    TED hose  As directed     Comments:      Use stockings (TED hose) for 3 weeks on both leg(s).  You may remove them at night for sleeping.    Weight bearing as  tolerated  As directed         Medication List    STOP taking these medications       aspirin EC 81 MG tablet     cholecalciferol 1000 UNITS tablet  Commonly known as:  VITAMIN D     fish oil-omega-3 fatty acids 1000 MG capsule     multivitamin with minerals Tabs      TAKE these medications       acetaminophen 500 MG tablet  Commonly known as:  TYLENOL  Take 500 mg by mouth every 6 (six) hours as needed. For pain     diazepam 5 MG tablet  Commonly known as:  VALIUM  Take 2.5 mg by mouth as needed for anxiety.     methocarbamol 500 MG tablet  Commonly known as:  ROBAXIN  Take 1 tablet (500 mg total) by mouth every 6 (six) hours as needed.     propranolol 10 MG tablet  Commonly known as:  INDERAL  Take 10 mg by mouth 2 (two) times daily.     rivaroxaban 10 MG Tabs tablet  Commonly known as:  XARELTO  Take 1 tablet (10  mg total) by mouth daily with breakfast. Take Xarelto for two and a half more weeks, then discontinue Xarelto.  Once the patient has completed the Xarelto, they may resume the 81 mg Aspirin.     traMADol 50 MG tablet  Commonly known as:  ULTRAM  Take 1-2 tablets (50-100 mg total) by mouth every 6 (six) hours as needed.           Follow-up Information   Follow up with Loanne Drilling, MD. Schedule an appointment as soon as possible for a visit in 2 weeks.   Contact information:   [address], [address] [place] [address] [phone]       Signed: Patrica Duel 05/01/2012, 11:45 AM

## 2012-05-01 NOTE — Progress Notes (Signed)
   Subjective: 2 Days Post-Op Procedure(s) (LRB): TOTAL HIP ARTHROPLASTY ANTERIOR APPROACH (Right) Patient reports pain as mild.   Patient seen in rounds with Dr. Lequita Halt. Doing great. Patient is well, and has had no acute complaints or problems Patient is ready to go home.  Objective: Vital signs in last 24 hours: Temp:  [97.2 F (36.2 C)-98 F (36.7 C)] 97.5 F (36.4 C) (02/28 0600) Pulse Rate:  [62-64] 64 (02/28 0950) Resp:  [14-18] 16 (02/28 0600) BP: (93-128)/(53-72) 117/72 mmHg (02/28 0950) SpO2:  [94 %-98 %] 98 % (02/28 0600)  Intake/Output from previous day:  Intake/Output Summary (Last 24 hours) at 05/01/12 1140 Last data filed at 05/01/12 0612  Gross per 24 hour  Intake 1529.33 ml  Output   1425 ml  Net 104.33 ml    Intake/Output this shift:    Labs:  Recent Labs  04/30/12 0440 05/01/12 0438  HGB 11.4* 10.4*    Recent Labs  04/30/12 0440 05/01/12 0438  WBC 19.2* 27.8*  RBC 3.89 3.69*  HCT 33.8* 32.0*  PLT 233 255    Recent Labs  04/30/12 0440 05/01/12 0438  NA 138 140  K 4.0 3.8  CL 105 106  CO2 24 25  BUN 10 12  CREATININE 0.50 0.52  GLUCOSE 184* 147*  CALCIUM 8.3* 8.7   No results found for this basename: LABPT, INR,  in the last 72 hours  EXAM: General - Patient is Alert, Appropriate and Oriented Extremity - Neurovascular intact Sensation intact distally Dorsiflexion/Plantar flexion intact No cellulitis present Incision - clean, dry, no drainage, healing Motor Function - intact, moving foot and toes well on exam.   Assessment/Plan: 2 Days Post-Op Procedure(s) (LRB): TOTAL HIP ARTHROPLASTY ANTERIOR APPROACH (Right) Procedure(s) (LRB): TOTAL HIP ARTHROPLASTY ANTERIOR APPROACH (Right) Past Medical History  Diagnosis Date  . Arthritis   . Stroke     2007 - no deficits per patient  . GERD (gastroesophageal reflux disease)   . Cancer     basal cell cancer on nose   . Neuromuscular disorder     benign essential tremors -  reason for Inderal   . Anxiety     occasional  . Peripheral vascular disease 2003    DVT  left leg post op   Principal Problem:   OA (osteoarthritis) of hip  Estimated body mass index is 31.05 kg/(m^2) as calculated from the following:   Height as of this encounter: 5' (1.524 m).   Weight as of this encounter: 72.122 kg (159 lb). Discharge home with home health Diet - Cardiac diet Follow up - in 2 weeks Activity - WBAT Disposition - Home Condition Upon Discharge - Good D/C Meds - See DC Summary DVT Prophylaxis - Xarelto  Erica Alvarez, Erica Alvarez 05/01/2012, 11:40 AM

## 2012-05-01 NOTE — Care Management Note (Signed)
    Page 1 of 2   05/01/2012     1:12:35 PM   CARE MANAGEMENT NOTE 05/01/2012  Patient:  Erica Alvarez, Erica Alvarez   Account Number:  [id number]  Date Initiated:  05/01/2012  Documentation initiated by:  Colleen Can  Subjective/Objective Assessment:   dx osteoarthritis right hip; total hip replacemnt -anterior approach     Action/Plan:   CM spoke with patient. Plans are for patient to returnm to her home in [place] where her spouse and sister-in-laws will be her caregivers. She already has RW and tiolet seat .Plans to use [facility] for [facility] services.   Anticipated DC Date:  05/01/2012   Anticipated DC Plan:  HOME W HOME HEALTH SERVICES  In-house referral  NA      DC Planning Services  CM consult      [facility] Choice  HOME HEALTH   Choice offered to / List presented to:  C-1 Patient   DME arranged  NA      DME agency  NA     HH arranged  HH-2 PT      [facility] agency  [facility]   Status of service:  Completed, signed off Medicare Important Message given?  NA - LOS <3 / Initial given by admissions (If response is "NO", the following Medicare IM given date fields will be blank) Date Medicare IM given:   Date Additional Medicare IM given:    Discharge Disposition:  HOME W HOME HEALTH SERVICES  Per UR Regulation:    If discussed at Long Length of Stay Meetings, dates discussed:    Comments:  05/01/2012 Colleen Can BSN RN CCM [phone] [facility] will provide HHPT services with start date 05/02/2012.

## 2012-05-02 DIAGNOSIS — D62 Acute posthemorrhagic anemia: Secondary | ICD-10-CM | POA: Diagnosis not present

## 2012-05-02 DIAGNOSIS — N390 Urinary tract infection, site not specified: Secondary | ICD-10-CM | POA: Diagnosis not present

## 2012-05-02 DIAGNOSIS — Z96649 Presence of unspecified artificial hip joint: Secondary | ICD-10-CM | POA: Diagnosis not present

## 2012-05-02 DIAGNOSIS — Z471 Aftercare following joint replacement surgery: Secondary | ICD-10-CM | POA: Diagnosis not present

## 2012-05-02 DIAGNOSIS — IMO0001 Reserved for inherently not codable concepts without codable children: Secondary | ICD-10-CM | POA: Diagnosis not present

## 2012-05-05 DIAGNOSIS — Z96649 Presence of unspecified artificial hip joint: Secondary | ICD-10-CM | POA: Diagnosis not present

## 2012-05-05 DIAGNOSIS — N390 Urinary tract infection, site not specified: Secondary | ICD-10-CM | POA: Diagnosis not present

## 2012-05-05 DIAGNOSIS — IMO0001 Reserved for inherently not codable concepts without codable children: Secondary | ICD-10-CM | POA: Diagnosis not present

## 2012-05-05 DIAGNOSIS — D62 Acute posthemorrhagic anemia: Secondary | ICD-10-CM | POA: Diagnosis not present

## 2012-05-05 DIAGNOSIS — Z471 Aftercare following joint replacement surgery: Secondary | ICD-10-CM | POA: Diagnosis not present

## 2012-05-07 DIAGNOSIS — IMO0001 Reserved for inherently not codable concepts without codable children: Secondary | ICD-10-CM | POA: Diagnosis not present

## 2012-05-07 DIAGNOSIS — N390 Urinary tract infection, site not specified: Secondary | ICD-10-CM | POA: Diagnosis not present

## 2012-05-07 DIAGNOSIS — Z96649 Presence of unspecified artificial hip joint: Secondary | ICD-10-CM | POA: Diagnosis not present

## 2012-05-07 DIAGNOSIS — D62 Acute posthemorrhagic anemia: Secondary | ICD-10-CM | POA: Diagnosis not present

## 2012-05-07 DIAGNOSIS — Z471 Aftercare following joint replacement surgery: Secondary | ICD-10-CM | POA: Diagnosis not present

## 2012-05-09 DIAGNOSIS — N390 Urinary tract infection, site not specified: Secondary | ICD-10-CM | POA: Diagnosis not present

## 2012-05-09 DIAGNOSIS — IMO0001 Reserved for inherently not codable concepts without codable children: Secondary | ICD-10-CM | POA: Diagnosis not present

## 2012-05-09 DIAGNOSIS — Z96649 Presence of unspecified artificial hip joint: Secondary | ICD-10-CM | POA: Diagnosis not present

## 2012-05-09 DIAGNOSIS — Z471 Aftercare following joint replacement surgery: Secondary | ICD-10-CM | POA: Diagnosis not present

## 2012-05-09 DIAGNOSIS — D62 Acute posthemorrhagic anemia: Secondary | ICD-10-CM | POA: Diagnosis not present

## 2012-05-11 DIAGNOSIS — D62 Acute posthemorrhagic anemia: Secondary | ICD-10-CM | POA: Diagnosis not present

## 2012-05-11 DIAGNOSIS — Z96649 Presence of unspecified artificial hip joint: Secondary | ICD-10-CM | POA: Diagnosis not present

## 2012-05-11 DIAGNOSIS — Z471 Aftercare following joint replacement surgery: Secondary | ICD-10-CM | POA: Diagnosis not present

## 2012-05-11 DIAGNOSIS — N390 Urinary tract infection, site not specified: Secondary | ICD-10-CM | POA: Diagnosis not present

## 2012-05-11 DIAGNOSIS — IMO0001 Reserved for inherently not codable concepts without codable children: Secondary | ICD-10-CM | POA: Diagnosis not present

## 2012-05-13 DIAGNOSIS — Z471 Aftercare following joint replacement surgery: Secondary | ICD-10-CM | POA: Diagnosis not present

## 2012-05-13 DIAGNOSIS — D62 Acute posthemorrhagic anemia: Secondary | ICD-10-CM | POA: Diagnosis not present

## 2012-05-13 DIAGNOSIS — Z96649 Presence of unspecified artificial hip joint: Secondary | ICD-10-CM | POA: Diagnosis not present

## 2012-05-13 DIAGNOSIS — N390 Urinary tract infection, site not specified: Secondary | ICD-10-CM | POA: Diagnosis not present

## 2012-05-13 DIAGNOSIS — IMO0001 Reserved for inherently not codable concepts without codable children: Secondary | ICD-10-CM | POA: Diagnosis not present

## 2012-05-15 DIAGNOSIS — Z96649 Presence of unspecified artificial hip joint: Secondary | ICD-10-CM | POA: Diagnosis not present

## 2012-05-15 DIAGNOSIS — Z471 Aftercare following joint replacement surgery: Secondary | ICD-10-CM | POA: Diagnosis not present

## 2012-05-15 DIAGNOSIS — IMO0001 Reserved for inherently not codable concepts without codable children: Secondary | ICD-10-CM | POA: Diagnosis not present

## 2012-05-15 DIAGNOSIS — D62 Acute posthemorrhagic anemia: Secondary | ICD-10-CM | POA: Diagnosis not present

## 2012-05-15 DIAGNOSIS — N390 Urinary tract infection, site not specified: Secondary | ICD-10-CM | POA: Diagnosis not present

## 2012-05-18 DIAGNOSIS — IMO0001 Reserved for inherently not codable concepts without codable children: Secondary | ICD-10-CM | POA: Diagnosis not present

## 2012-05-18 DIAGNOSIS — N390 Urinary tract infection, site not specified: Secondary | ICD-10-CM | POA: Diagnosis not present

## 2012-05-18 DIAGNOSIS — Z96649 Presence of unspecified artificial hip joint: Secondary | ICD-10-CM | POA: Diagnosis not present

## 2012-05-18 DIAGNOSIS — Z471 Aftercare following joint replacement surgery: Secondary | ICD-10-CM | POA: Diagnosis not present

## 2012-05-18 DIAGNOSIS — D62 Acute posthemorrhagic anemia: Secondary | ICD-10-CM | POA: Diagnosis not present

## 2012-05-20 DIAGNOSIS — Z471 Aftercare following joint replacement surgery: Secondary | ICD-10-CM | POA: Diagnosis not present

## 2012-05-20 DIAGNOSIS — Z96649 Presence of unspecified artificial hip joint: Secondary | ICD-10-CM | POA: Diagnosis not present

## 2012-05-20 DIAGNOSIS — IMO0001 Reserved for inherently not codable concepts without codable children: Secondary | ICD-10-CM | POA: Diagnosis not present

## 2012-05-20 DIAGNOSIS — N390 Urinary tract infection, site not specified: Secondary | ICD-10-CM | POA: Diagnosis not present

## 2012-05-20 DIAGNOSIS — D62 Acute posthemorrhagic anemia: Secondary | ICD-10-CM | POA: Diagnosis not present

## 2012-05-22 DIAGNOSIS — Z96649 Presence of unspecified artificial hip joint: Secondary | ICD-10-CM | POA: Diagnosis not present

## 2012-05-22 DIAGNOSIS — Z471 Aftercare following joint replacement surgery: Secondary | ICD-10-CM | POA: Diagnosis not present

## 2012-05-22 DIAGNOSIS — IMO0001 Reserved for inherently not codable concepts without codable children: Secondary | ICD-10-CM | POA: Diagnosis not present

## 2012-05-22 DIAGNOSIS — N390 Urinary tract infection, site not specified: Secondary | ICD-10-CM | POA: Diagnosis not present

## 2012-05-22 DIAGNOSIS — M545 Low back pain, unspecified: Secondary | ICD-10-CM | POA: Diagnosis not present

## 2012-05-22 DIAGNOSIS — N949 Unspecified condition associated with female genital organs and menstrual cycle: Secondary | ICD-10-CM | POA: Diagnosis not present

## 2012-05-22 DIAGNOSIS — D62 Acute posthemorrhagic anemia: Secondary | ICD-10-CM | POA: Diagnosis not present

## 2012-05-26 DIAGNOSIS — IMO0001 Reserved for inherently not codable concepts without codable children: Secondary | ICD-10-CM | POA: Diagnosis not present

## 2012-05-26 DIAGNOSIS — Z96649 Presence of unspecified artificial hip joint: Secondary | ICD-10-CM | POA: Diagnosis not present

## 2012-05-26 DIAGNOSIS — N390 Urinary tract infection, site not specified: Secondary | ICD-10-CM | POA: Diagnosis not present

## 2012-05-26 DIAGNOSIS — D62 Acute posthemorrhagic anemia: Secondary | ICD-10-CM | POA: Diagnosis not present

## 2012-05-26 DIAGNOSIS — Z471 Aftercare following joint replacement surgery: Secondary | ICD-10-CM | POA: Diagnosis not present

## 2012-05-27 DIAGNOSIS — IMO0001 Reserved for inherently not codable concepts without codable children: Secondary | ICD-10-CM | POA: Diagnosis not present

## 2012-05-27 DIAGNOSIS — N390 Urinary tract infection, site not specified: Secondary | ICD-10-CM | POA: Diagnosis not present

## 2012-05-27 DIAGNOSIS — Z96649 Presence of unspecified artificial hip joint: Secondary | ICD-10-CM | POA: Diagnosis not present

## 2012-05-27 DIAGNOSIS — Z471 Aftercare following joint replacement surgery: Secondary | ICD-10-CM | POA: Diagnosis not present

## 2012-05-27 DIAGNOSIS — D62 Acute posthemorrhagic anemia: Secondary | ICD-10-CM | POA: Diagnosis not present

## 2012-05-29 DIAGNOSIS — IMO0001 Reserved for inherently not codable concepts without codable children: Secondary | ICD-10-CM | POA: Diagnosis not present

## 2012-05-29 DIAGNOSIS — D62 Acute posthemorrhagic anemia: Secondary | ICD-10-CM | POA: Diagnosis not present

## 2012-05-29 DIAGNOSIS — Z471 Aftercare following joint replacement surgery: Secondary | ICD-10-CM | POA: Diagnosis not present

## 2012-05-29 DIAGNOSIS — N390 Urinary tract infection, site not specified: Secondary | ICD-10-CM | POA: Diagnosis not present

## 2012-05-29 DIAGNOSIS — Z96649 Presence of unspecified artificial hip joint: Secondary | ICD-10-CM | POA: Diagnosis not present

## 2012-06-05 DIAGNOSIS — Z96649 Presence of unspecified artificial hip joint: Secondary | ICD-10-CM | POA: Diagnosis not present

## 2012-06-11 DIAGNOSIS — H2510 Age-related nuclear cataract, unspecified eye: Secondary | ICD-10-CM | POA: Diagnosis not present

## 2012-06-22 ENCOUNTER — Encounter: Payer: Self-pay | Admitting: Gastroenterology

## 2012-07-14 ENCOUNTER — Ambulatory Visit (AMBULATORY_SURGERY_CENTER): Payer: Medicare Other | Admitting: *Deleted

## 2012-07-14 VITALS — Ht 60.0 in | Wt 160.0 lb

## 2012-07-14 DIAGNOSIS — Z1211 Encounter for screening for malignant neoplasm of colon: Secondary | ICD-10-CM

## 2012-07-14 MED ORDER — MOVIPREP 100 G PO SOLR: ORAL | Status: DC

## 2012-08-05 ENCOUNTER — Encounter: Payer: Self-pay | Admitting: Gastroenterology

## 2012-08-05 ENCOUNTER — Ambulatory Visit (AMBULATORY_SURGERY_CENTER): Payer: Medicare Other | Admitting: Gastroenterology

## 2012-08-05 VITALS — BP 138/88 | HR 58 | Temp 98.6°F | Resp 13 | Ht 60.0 in | Wt 160.0 lb

## 2012-08-05 DIAGNOSIS — Z800 Family history of malignant neoplasm of digestive organs: Secondary | ICD-10-CM | POA: Diagnosis not present

## 2012-08-05 DIAGNOSIS — Z86010 Personal history of colonic polyps: Secondary | ICD-10-CM

## 2012-08-05 DIAGNOSIS — Z8673 Personal history of transient ischemic attack (TIA), and cerebral infarction without residual deficits: Secondary | ICD-10-CM | POA: Diagnosis not present

## 2012-08-05 DIAGNOSIS — Z1211 Encounter for screening for malignant neoplasm of colon: Secondary | ICD-10-CM

## 2012-08-05 MED ORDER — SODIUM CHLORIDE 0.9 % IV SOLN: 500.0000 mL | INTRAVENOUS | Status: DC

## 2012-08-05 NOTE — Patient Instructions (Signed)
YOU HAD AN ENDOSCOPIC PROCEDURE TODAY AT THE [facility]: Refer to the procedure report that was given to you for any specific questions about what was found during the examination.  If the procedure report does not answer your questions, please call your gastroenterologist to clarify.  If you requested that your care partner not be given the details of your procedure findings, then the procedure report has been included in a sealed envelope for you to review at your convenience later.  YOU SHOULD EXPECT: Some feelings of bloating in the abdomen. Passage of more gas than usual.  Walking can help get rid of the air that was put into your GI tract during the procedure and reduce the bloating. If you had a lower endoscopy (such as a colonoscopy or flexible sigmoidoscopy) you may notice spotting of blood in your stool or on the toilet paper. If you underwent a bowel prep for your procedure, then you may not have a normal bowel movement for a few days.  DIET: Your first meal following the procedure should be a light meal and then it is ok to progress to your normal diet.  A half-sandwich or bowl of soup is an example of a good first meal.  Heavy or fried foods are harder to digest and may make you feel nauseous or bloated.  Likewise meals heavy in dairy and vegetables can cause extra gas to form and this can also increase the bloating.  Drink plenty of fluids but you should avoid alcoholic beverages for 24 hours.  ACTIVITY: Your care partner should take you home directly after the procedure.  You should plan to take it easy, moving slowly for the rest of the day.  You can resume normal activity the day after the procedure however you should NOT DRIVE or use heavy machinery for 24 hours (because of the sedation medicines used during the test).    SYMPTOMS TO REPORT IMMEDIATELY: A gastroenterologist can be reached at any hour.  During normal business hours, 8:30 AM to 5:00 PM Monday through Friday,  call [phone].  After hours and on weekends, please call the GI answering service at [phone] who will take a message and have the physician on call contact you.   Following lower endoscopy (colonoscopy or flexible sigmoidoscopy):  Excessive amounts of blood in the stool  Significant tenderness or worsening of abdominal pains  Swelling of the abdomen that is new, acute  Fever of 100F or higher    FOLLOW UP: If any biopsies were taken you will be contacted by phone or by letter within the next 1-3 weeks.  Call your gastroenterologist if you have not heard about the biopsies in 3 weeks.  Our staff will call the home number listed on your records the next business day following your procedure to check on you and address any questions or concerns that you may have at that time regarding the information given to you following your procedure. This is a courtesy call and so if there is no answer at the home number and we have not heard from you through the emergency physician on call, we will assume that you have returned to your regular daily activities without incident.  SIGNATURES/CONFIDENTIALITY: You and/or your care partner have signed paperwork which will be entered into your electronic medical record.  These signatures attest to the fact that that the information above on your After Visit Summary has been reviewed and is understood.  Full responsibility of the confidentiality   of this discharge information lies with you and/or your care-partner.     

## 2012-08-05 NOTE — Op Note (Signed)
[facility] [address] [place], [address]   COLONOSCOPY PROCEDURE REPORT  PATIENT: Erica, Alvarez  MR#: [id number] BIRTHDATE: [birth year] , 66  yrs. old GENDER: Female ENDOSCOPIST: Mardella Layman, MD, Clementeen Graham REFERRED BY:  Berenda Morale, M.D. PROCEDURE DATE:  08/05/2012 PROCEDURE:   Colonoscopy, surveillance ASA CLASS:   Class II INDICATIONS:Patient's personal history of adenomatous colon polyps.  MEDICATIONS: propofol (Diprivan) 150mg  IV  DESCRIPTION OF PROCEDURE:   After the risks and benefits and of the procedure were explained, informed consent was obtained.  A digital rectal exam revealed no abnormalities of the rectum.    The [id number]  endoscope was introduced through the anus and advanced to the cecum, which was identified by both the appendix and ileocecal valve .  The quality of the prep was excellent, using MoviPrep .  The instrument was then slowly withdrawn as the colon was fully examined.     COLON FINDINGS: Mild diverticulosis was noted at the splenic flexure and in the descending colon.   A normal appearing cecum, ileocecal valve, and appendiceal orifice were identified.  The ascending, hepatic flexure, transverse, splenic flexure, descending, sigmoid colon and rectum appeared unremarkable.  No polyps or cancers were seen.     Retroflexed views revealed internal hemorrhoids.     The scope was then withdrawn from the patient and the procedure completed.  COMPLICATIONS: There were no complications. ENDOSCOPIC IMPRESSION: 1.   Mild diverticulosis was noted at the splenic flexure and in the descending colon 2.   Normal colon ...no polyps noted  RECOMMENDATIONS: 1.  High fiber diet 2.  Metamucil or benefiber 3.  Repeat Colonoscopy in 5 years.   REPEAT EXAM:  cc:  _______________________________ eSignedMardella Layman, MD, [facility] 08/05/2012 9:54 AM

## 2012-08-05 NOTE — Progress Notes (Signed)
Patient did not experience any of the following events: a burn prior to discharge; a fall within the facility; wrong site/side/patient/procedure/implant event; or a hospital transfer or hospital admission upon discharge from the facility. (G8907) Patient did not have preoperative order for IV antibiotic SSI prophylaxis. (G8918)  

## 2012-08-06 ENCOUNTER — Telehealth: Payer: Self-pay | Admitting: *Deleted

## 2012-08-06 NOTE — Telephone Encounter (Signed)
  Follow up Call-  Call back number 08/05/2012  Post procedure Call Back phone  # [phone]  Permission to leave phone message Yes     Patient questions:  Do you have a fever, pain , or abdominal swelling? no Pain Score  0 *  Have you tolerated food without any problems? yes  Have you been able to return to your normal activities? yes  Do you have any questions about your discharge instructions: Diet   no Medications  no Follow up visit  no  Do you have questions or concerns about your Care? no  Actions: * If pain score is 4 or above: No action needed, pain <4.

## 2012-08-11 DIAGNOSIS — M1710 Unilateral primary osteoarthritis, unspecified knee: Secondary | ICD-10-CM | POA: Diagnosis not present

## 2012-08-11 DIAGNOSIS — J301 Allergic rhinitis due to pollen: Secondary | ICD-10-CM | POA: Diagnosis not present

## 2012-08-11 DIAGNOSIS — H6120 Impacted cerumen, unspecified ear: Secondary | ICD-10-CM | POA: Diagnosis not present

## 2012-08-11 DIAGNOSIS — H93299 Other abnormal auditory perceptions, unspecified ear: Secondary | ICD-10-CM | POA: Diagnosis not present

## 2012-09-29 DIAGNOSIS — E7800 Pure hypercholesterolemia, unspecified: Secondary | ICD-10-CM | POA: Diagnosis not present

## 2012-09-29 DIAGNOSIS — R5383 Other fatigue: Secondary | ICD-10-CM | POA: Diagnosis not present

## 2012-09-29 DIAGNOSIS — Z1331 Encounter for screening for depression: Secondary | ICD-10-CM | POA: Diagnosis not present

## 2012-09-29 DIAGNOSIS — Z0000 Encounter for general adult medical examination without abnormal findings: Secondary | ICD-10-CM | POA: Diagnosis not present

## 2012-09-29 DIAGNOSIS — R209 Unspecified disturbances of skin sensation: Secondary | ICD-10-CM | POA: Diagnosis not present

## 2012-09-29 DIAGNOSIS — M899 Disorder of bone, unspecified: Secondary | ICD-10-CM | POA: Diagnosis not present

## 2012-09-29 DIAGNOSIS — F411 Generalized anxiety disorder: Secondary | ICD-10-CM | POA: Diagnosis not present

## 2012-10-01 DIAGNOSIS — J329 Chronic sinusitis, unspecified: Secondary | ICD-10-CM | POA: Diagnosis not present

## 2012-10-07 ENCOUNTER — Other Ambulatory Visit: Payer: Self-pay

## 2012-10-22 DIAGNOSIS — Z96649 Presence of unspecified artificial hip joint: Secondary | ICD-10-CM | POA: Diagnosis not present

## 2012-10-26 DIAGNOSIS — J329 Chronic sinusitis, unspecified: Secondary | ICD-10-CM | POA: Diagnosis not present

## 2012-11-10 DIAGNOSIS — L820 Inflamed seborrheic keratosis: Secondary | ICD-10-CM | POA: Diagnosis not present

## 2012-11-10 DIAGNOSIS — Z85828 Personal history of other malignant neoplasm of skin: Secondary | ICD-10-CM | POA: Diagnosis not present

## 2012-11-10 DIAGNOSIS — D485 Neoplasm of uncertain behavior of skin: Secondary | ICD-10-CM | POA: Diagnosis not present

## 2012-11-10 DIAGNOSIS — L570 Actinic keratosis: Secondary | ICD-10-CM | POA: Diagnosis not present

## 2012-11-16 DIAGNOSIS — M25819 Other specified joint disorders, unspecified shoulder: Secondary | ICD-10-CM | POA: Diagnosis not present

## 2012-12-13 DIAGNOSIS — Z23 Encounter for immunization: Secondary | ICD-10-CM | POA: Diagnosis not present

## 2012-12-14 DIAGNOSIS — M25819 Other specified joint disorders, unspecified shoulder: Secondary | ICD-10-CM | POA: Diagnosis not present

## 2012-12-21 DIAGNOSIS — J329 Chronic sinusitis, unspecified: Secondary | ICD-10-CM | POA: Diagnosis not present

## 2012-12-21 DIAGNOSIS — E559 Vitamin D deficiency, unspecified: Secondary | ICD-10-CM | POA: Diagnosis not present

## 2012-12-21 DIAGNOSIS — E7800 Pure hypercholesterolemia, unspecified: Secondary | ICD-10-CM | POA: Diagnosis not present

## 2012-12-25 ENCOUNTER — Emergency Department (HOSPITAL_BASED_OUTPATIENT_CLINIC_OR_DEPARTMENT_OTHER): Payer: Medicare Other

## 2012-12-25 ENCOUNTER — Emergency Department (HOSPITAL_BASED_OUTPATIENT_CLINIC_OR_DEPARTMENT_OTHER)
Admission: EM | Admit: 2012-12-25 | Discharge: 2012-12-25 | Disposition: A | Discharge status: Home / Self Care | Payer: Medicare Other | Attending: Emergency Medicine | Admitting: Emergency Medicine

## 2012-12-25 DIAGNOSIS — R110 Nausea: Secondary | ICD-10-CM | POA: Insufficient documentation

## 2012-12-25 DIAGNOSIS — M129 Arthropathy, unspecified: Secondary | ICD-10-CM | POA: Diagnosis not present

## 2012-12-25 DIAGNOSIS — R0609 Other forms of dyspnea: Secondary | ICD-10-CM | POA: Insufficient documentation

## 2012-12-25 DIAGNOSIS — Z8522 Personal history of malignant neoplasm of nasal cavities, middle ear, and accessory sinuses: Secondary | ICD-10-CM | POA: Insufficient documentation

## 2012-12-25 DIAGNOSIS — R0989 Other specified symptoms and signs involving the circulatory and respiratory systems: Secondary | ICD-10-CM | POA: Diagnosis not present

## 2012-12-25 DIAGNOSIS — Z880 Allergy status to penicillin: Secondary | ICD-10-CM | POA: Insufficient documentation

## 2012-12-25 DIAGNOSIS — Z79899 Other long term (current) drug therapy: Secondary | ICD-10-CM | POA: Insufficient documentation

## 2012-12-25 DIAGNOSIS — F411 Generalized anxiety disorder: Secondary | ICD-10-CM | POA: Insufficient documentation

## 2012-12-25 DIAGNOSIS — Z8679 Personal history of other diseases of the circulatory system: Secondary | ICD-10-CM | POA: Diagnosis not present

## 2012-12-25 DIAGNOSIS — Z7982 Long term (current) use of aspirin: Secondary | ICD-10-CM | POA: Insufficient documentation

## 2012-12-25 DIAGNOSIS — Z8673 Personal history of transient ischemic attack (TIA), and cerebral infarction without residual deficits: Secondary | ICD-10-CM | POA: Diagnosis not present

## 2012-12-25 DIAGNOSIS — R0602 Shortness of breath: Secondary | ICD-10-CM | POA: Insufficient documentation

## 2012-12-25 DIAGNOSIS — Z87891 Personal history of nicotine dependence: Secondary | ICD-10-CM | POA: Diagnosis not present

## 2012-12-25 DIAGNOSIS — R259 Unspecified abnormal involuntary movements: Secondary | ICD-10-CM | POA: Insufficient documentation

## 2012-12-25 DIAGNOSIS — R51 Headache: Secondary | ICD-10-CM | POA: Diagnosis not present

## 2012-12-25 DIAGNOSIS — Z8719 Personal history of other diseases of the digestive system: Secondary | ICD-10-CM | POA: Insufficient documentation

## 2012-12-25 DIAGNOSIS — Z8669 Personal history of other diseases of the nervous system and sense organs: Secondary | ICD-10-CM | POA: Diagnosis not present

## 2012-12-25 DIAGNOSIS — G9389 Other specified disorders of brain: Secondary | ICD-10-CM | POA: Diagnosis not present

## 2012-12-25 LAB — COMPREHENSIVE METABOLIC PANEL
ALT: 10 U/L (ref 0–35)
CO2: 26 mEq/L (ref 19–32)
Calcium: 9.6 mg/dL (ref 8.4–10.5)
Creatinine, Ser: 0.6 mg/dL (ref 0.50–1.10)
GFR calc Af Amer: >90 mL/min (ref >90)
GFR calc non Af Amer: >90 mL/min (ref >90)
Glucose, Bld: 92 mg/dL (ref 70–99)
Sodium: 142 mEq/L (ref 135–145)
Total Protein: 6.5 g/dL (ref 6.0–8.3)

## 2012-12-25 LAB — CBC WITH DIFFERENTIAL/PLATELET
Eosinophils Absolute: 0.1 10*3/uL (ref 0.0–0.7)
Eosinophils Relative: 1 % (ref 0–5)
HCT: 38.2 % (ref 36.0–46.0)
Hemoglobin: 12.2 g/dL (ref 12.0–15.0)
Lymphocytes Relative: 22 % (ref 12–46)
Lymphs Abs: 2.2 10*3/uL (ref 0.7–4.0)
MCH: 27.5 pg (ref 26.0–34.0)
MCV: 86 fL (ref 78.0–100.0)
Monocytes Absolute: 0.8 10*3/uL (ref 0.1–1.0)
Neutro Abs: 7.2 10*3/uL (ref 1.7–7.7)
Platelets: 269 10*3/uL (ref 150–400)
RBC: 4.44 MIL/uL (ref 3.87–5.11)
RDW: 14.6 % (ref 11.5–15.5)
WBC: 10.4 10*3/uL (ref 4.0–10.5)

## 2012-12-25 LAB — TROPONIN I
Troponin I: <0.3 ng/mL (ref <0.30)
Troponin I: <0.3 ng/mL (ref <0.30)

## 2012-12-25 LAB — URINALYSIS, ROUTINE W REFLEX MICROSCOPIC
Hgb urine dipstick: NEGATIVE
Ketones, ur: NEGATIVE mg/dL
Leukocytes, UA: NEGATIVE
Nitrite: NEGATIVE
Protein, ur: NEGATIVE mg/dL
Specific Gravity, Urine: 1.005 (ref 1.005–1.030)
Urobilinogen, UA: 0.2 mg/dL (ref 0.0–1.0)

## 2012-12-25 MED ORDER — LORAZEPAM 2 MG/ML IJ SOLN
1.0000 mg | Freq: Once | INTRAMUSCULAR | Status: AC
  Administered 2012-12-25: 1 mg via INTRAVENOUS
  Filled 2012-12-25: qty 1

## 2012-12-25 MED ORDER — KETOROLAC TROMETHAMINE 30 MG/ML IJ SOLN
30.0000 mg | Freq: Once | INTRAMUSCULAR | Status: AC
  Administered 2012-12-25: 30 mg via INTRAVENOUS
  Filled 2012-12-25: qty 1

## 2012-12-25 MED ORDER — DIAZEPAM 5 MG PO TABS: 5.0000 mg | ORAL_TABLET | Freq: Three times a day (TID) | ORAL | Status: DC | PRN

## 2012-12-25 MED ORDER — TRAMADOL HCL 50 MG PO TABS: 50.0000 mg | ORAL_TABLET | Freq: Three times a day (TID) | ORAL | Status: DC | PRN

## 2012-12-25 MED ORDER — SODIUM CHLORIDE 0.9 % IV BOLUS (SEPSIS)
1000.0000 mL | Freq: Once | INTRAVENOUS | Status: AC
  Administered 2012-12-25: 1000 mL via INTRAVENOUS

## 2012-12-25 NOTE — ED Provider Notes (Signed)
CSN: [id number]     Arrival date & time 12/25/12  0912 History   First MD Initiated Contact with Patient 12/25/12 [phone]     Chief Complaint  Patient presents with  . Shortness of Breath    x 2 weeks  . Dizziness    x 2 weeks  . Nausea    HPI  Patient presents with multiple concerns. She notes over the past weeks she has had persistent mild dyspnea. Over this timeframe she has been able to perform activities of daily living, and water aerobics sessions. Today, she felt as though her dyspnea became worse during water aerobics. Also today, the patient has no headache, right-sided, from the posterior radiating anteriorly. There is no new visual changes, no no vomiting, no no unilateral weakness or dysesthesia, no new speech deficits. Patient has history of stroke, neuromuscular disorder, but has been generally in good health until this symptoms began. No clear precipitant or No clear alleviating or exacerbating factors. On my exam the patient has only had pain, specifically denies chest pain, abdominal pain, other focal complaints.   Past Medical History  Diagnosis Date  . Arthritis   . Stroke     2007 - no deficits per patient  . GERD (gastroesophageal reflux disease)   . Cancer     basal cell cancer on nose   . Neuromuscular disorder     benign essential tremors - reason for Inderal   . Anxiety     occasional  . Peripheral vascular disease 2003    DVT  left leg post op   Past Surgical History  Procedure Laterality Date  . Laparoscopic cholecystectomy  1990  . Abdominal hysterectomy    . Tonsillectomy    . Patent foramen ovale closure  2007  . Total knee arthroplasty  12/10/2011    Procedure: TOTAL KNEE ARTHROPLASTY;  Surgeon: Loanne Drilling, MD;  Location: [facility];  Service: Orthopedics;  Laterality: Left;  . Joint replacement  2010    right knee /left knee  . Total hip arthroplasty Right 04/29/2012    Procedure: TOTAL HIP ARTHROPLASTY ANTERIOR APPROACH;  Surgeon: Loanne Drilling, MD;  Location: [facility];  Service: Orthopedics;  Laterality: Right;   Family History  Problem Relation Age of Onset  . Dementia Mother   . Cancer - Colon Father   . Colon cancer Father 65   History  Substance Use Topics  . Smoking status: Former Smoker    Quit date: 03/04/1980  . Smokeless tobacco: Never Used  . Alcohol Use: Yes     Comment: occ glass of wine    OB History   Grav Para Term Preterm Abortions TAB SAB Ect Mult Living                 Review of Systems  Constitutional:       Per HPI, otherwise negative  HENT:       Per HPI, otherwise negative  Respiratory:       Per HPI, otherwise negative  Cardiovascular:       Per HPI, otherwise negative  Gastrointestinal: Negative for vomiting.  Endocrine:       Negative aside from HPI  Genitourinary:       Neg aside from HPI   Musculoskeletal:       Per HPI, otherwise negative  Skin: Negative.   Neurological: Positive for dizziness, tremors and headaches. Negative for syncope, weakness and light-headedness.    Allergies  Penicillins; Sulfonamide derivatives; and  Tetracycline  Home Medications   Current Outpatient Rx  Name  Route  Sig  Dispense  Refill  . aspirin 81 MG tablet   Oral   Take 81 mg by mouth daily.         Marland Kitchen acetaminophen (TYLENOL) 500 MG tablet   Oral   Take 500 mg by mouth every 6 (six) hours as needed. For pain         . diazepam (VALIUM) 5 MG tablet   Oral   Take 2.5 mg by mouth as needed for anxiety.          . fish oil-omega-3 fatty acids 1000 MG capsule   Oral   Take 1 g by mouth daily.         . fluticasone (FLONASE) 50 MCG/ACT nasal spray               . methocarbamol (ROBAXIN) 500 MG tablet   Oral   Take 1 tablet (500 mg total) by mouth every 6 (six) hours as needed.   80 tablet   0   . Multiple Vitamin (MULTIVITAMIN) tablet   Oral   Take 1 tablet by mouth daily.         . propranolol (INDERAL) 10 MG tablet   Oral   Take 10 mg by mouth 2 (two)  times daily.         . traMADol (ULTRAM) 50 MG tablet   Oral   Take 1-2 tablets (50-100 mg total) by mouth every 6 (six) hours as needed.   80 tablet   0    BP 190/99  Pulse 74  Temp(Src) 97.9 F (36.6 C) (Oral)  Resp 18  Ht 5' (1.524 m)  Wt 160 lb (72.576 kg)  BMI 31.25 kg/m2  SpO2 98% Physical Exam  Nursing note and vitals reviewed. Constitutional: She is oriented to person, place, and time. She appears well-developed and well-nourished. No distress.  HENT:  Head: Normocephalic and atraumatic.  Eyes: Conjunctivae and EOM are normal.  Cardiovascular: Normal rate and regular rhythm.   Pulmonary/Chest: Effort normal and breath sounds normal. No stridor. No respiratory distress.  Abdominal: She exhibits no distension.  Musculoskeletal: She exhibits no edema.  Neurological: She is alert and oriented to person, place, and time. She is not disoriented. She displays tremor. She displays no atrophy. No cranial nerve deficit or sensory deficit. She exhibits normal muscle tone. She displays no seizure activity. Coordination normal.  Skin: Skin is warm and dry.  Psychiatric: She has a normal mood and affect. Her speech is normal and behavior is normal. Judgment and thought content normal. Cognition and memory are normal.    ED Course  Procedures (including critical care time) Labs Review Labs Reviewed  COMPREHENSIVE METABOLIC PANEL - Abnormal; Notable for the following:    Total Bilirubin 0.2 (*)    All other components within normal limits  CBC WITH DIFFERENTIAL  TROPONIN I  URINALYSIS, ROUTINE W REFLEX MICROSCOPIC   Imaging Review Dg Chest 2 View  12/25/2012   CLINICAL DATA:  Dizziness with shortness of breath. History of stroke and DVT.  EXAM: CHEST  2 VIEW  COMPARISON:  07/31/2011 radiographs.  FINDINGS: The heart size and mediastinal contours are stable status post septal occlusion device placement. There is stable scarring at the right lung base. No edema, confluent  airspace opacity or significant pleural effusion is seen.  IMPRESSION: Stable chest. No active cardiopulmonary process.   Electronically Signed   By: Roxy Horseman  M.D.   On: 12/25/2012 10:41   Ct Head Wo Contrast  12/25/2012   CLINICAL DATA:  Altered mental status with right-sided headaches. History of stroke.  EXAM: CT HEAD WITHOUT CONTRAST  TECHNIQUE: Contiguous axial images were obtained from the base of the skull through the vertex without intravenous contrast.  COMPARISON:  Head CT and brain MRI 03/15/2005.  FINDINGS: There is encephalomalacia in the right frontal lobe along the sylvian fissure at the site of the previously demonstrated infarct. Mild ipsilateral dilatation of the right frontal horn is similar to the prior study. There is no evidence of acute intracranial hemorrhage, mass lesion, brain edema or extra-axial fluid collection. There is no evidence of acute infarct.  The visualized paranasal sinuses, mastoid air cells and middle ears are clear. The calvarium is intact.  IMPRESSION: No acute intracranial findings. Right frontal lobe encephalomalacia related to the previously demonstrated infarct.   Electronically Signed   By: Roxy Horseman M.D.   On: 12/25/2012 10:32    EKG Interpretation     Ventricular Rate:  65 PR Interval:  178 QRS Duration: 80 QT Interval:  390 QTC Calculation: 405 R Axis:   61 Text Interpretation:  Normal sinus rhythm Normal ECG           Cardiac monitor 60 sinus rhythm normal Pulse oximetry 100% room air normal   11:03 AM Initial results discussed the patient and her husband.  Patient continues to have headaches, dizziness.  Medication ordered.  1:57 PM Patient asleep. When awakened, she states that her dizziness is gone.  She has minimal headache. We had a lengthy discussion of all results thus far.  With the resolution of symptoms, we discussed MRI, given her history of stroke.  She will return tomorrow for this exam, otherwise follow up  with her primary care physician the  MDM  No diagnosis found. This patient presents with ongoing dyspnea, but more concerned about no headache, mild dizziness.  On exam the patient is awake alert and neurologically appropriate, with no focal deficits.  She is afebrile, with low suspicion for occult infection.  Patient improved substantially here.  However, with her history of CVA, her encephalomalacia, MRI is indicated for further evaluation of possible cerebellar change.  Patient is on aspirin, and seems appropriate.  Patient will return tomorrow for MRI, which is not available currently.  She was counseled on return precautions, follow up instructions, and then appropriate for discharged in stable condition.    Gerhard Munch, MD 12/25/12 [phone]

## 2012-12-25 NOTE — ED Notes (Signed)
Patient transported to CT and xray 

## 2012-12-25 NOTE — ED Notes (Signed)
Pt reports "dizziness" and "SOB" x "a couple of weeks", "today" condition "was worse during water aerobics". Pt also report HA on R side of the face "worse today"

## 2012-12-26 ENCOUNTER — Ambulatory Visit (HOSPITAL_BASED_OUTPATIENT_CLINIC_OR_DEPARTMENT_OTHER)
Admit: 2012-12-26 | Discharge: 2012-12-26 | Disposition: A | Discharge status: Home / Self Care | Payer: Medicare Other | Attending: Emergency Medicine | Admitting: Emergency Medicine

## 2012-12-26 DIAGNOSIS — G9389 Other specified disorders of brain: Secondary | ICD-10-CM | POA: Diagnosis not present

## 2012-12-28 DIAGNOSIS — J309 Allergic rhinitis, unspecified: Secondary | ICD-10-CM | POA: Diagnosis not present

## 2012-12-28 DIAGNOSIS — R5381 Other malaise: Secondary | ICD-10-CM | POA: Diagnosis not present

## 2012-12-28 DIAGNOSIS — Z8673 Personal history of transient ischemic attack (TIA), and cerebral infarction without residual deficits: Secondary | ICD-10-CM | POA: Diagnosis not present

## 2012-12-28 DIAGNOSIS — R5383 Other fatigue: Secondary | ICD-10-CM | POA: Diagnosis not present

## 2012-12-28 DIAGNOSIS — E559 Vitamin D deficiency, unspecified: Secondary | ICD-10-CM | POA: Diagnosis not present

## 2012-12-28 DIAGNOSIS — E7800 Pure hypercholesterolemia, unspecified: Secondary | ICD-10-CM | POA: Diagnosis not present

## 2013-01-07 ENCOUNTER — Other Ambulatory Visit: Payer: Self-pay

## 2013-02-03 DIAGNOSIS — H6120 Impacted cerumen, unspecified ear: Secondary | ICD-10-CM | POA: Diagnosis not present

## 2013-02-03 DIAGNOSIS — J0100 Acute maxillary sinusitis, unspecified: Secondary | ICD-10-CM | POA: Diagnosis not present

## 2013-03-13 DIAGNOSIS — M25539 Pain in unspecified wrist: Secondary | ICD-10-CM | POA: Diagnosis not present

## 2013-03-13 DIAGNOSIS — M25519 Pain in unspecified shoulder: Secondary | ICD-10-CM | POA: Diagnosis not present

## 2013-04-05 DIAGNOSIS — M1610 Unilateral primary osteoarthritis, unspecified hip: Secondary | ICD-10-CM | POA: Diagnosis not present

## 2013-04-05 DIAGNOSIS — M1710 Unilateral primary osteoarthritis, unspecified knee: Secondary | ICD-10-CM | POA: Diagnosis not present

## 2013-04-05 DIAGNOSIS — M169 Osteoarthritis of hip, unspecified: Secondary | ICD-10-CM | POA: Diagnosis not present

## 2013-04-05 DIAGNOSIS — Z96649 Presence of unspecified artificial hip joint: Secondary | ICD-10-CM | POA: Diagnosis not present

## 2013-04-12 DIAGNOSIS — M19049 Primary osteoarthritis, unspecified hand: Secondary | ICD-10-CM | POA: Diagnosis not present

## 2013-04-12 DIAGNOSIS — M6530 Trigger finger, unspecified finger: Secondary | ICD-10-CM | POA: Diagnosis not present

## 2013-04-12 DIAGNOSIS — R229 Localized swelling, mass and lump, unspecified: Secondary | ICD-10-CM | POA: Diagnosis not present

## 2013-04-13 DIAGNOSIS — J309 Allergic rhinitis, unspecified: Secondary | ICD-10-CM | POA: Diagnosis not present

## 2013-04-13 DIAGNOSIS — R5381 Other malaise: Secondary | ICD-10-CM | POA: Diagnosis not present

## 2013-04-13 DIAGNOSIS — E7800 Pure hypercholesterolemia, unspecified: Secondary | ICD-10-CM | POA: Diagnosis not present

## 2013-04-13 DIAGNOSIS — R5383 Other fatigue: Secondary | ICD-10-CM | POA: Diagnosis not present

## 2013-04-13 DIAGNOSIS — E559 Vitamin D deficiency, unspecified: Secondary | ICD-10-CM | POA: Diagnosis not present

## 2013-04-13 DIAGNOSIS — Z8673 Personal history of transient ischemic attack (TIA), and cerebral infarction without residual deficits: Secondary | ICD-10-CM | POA: Diagnosis not present

## 2013-04-22 DIAGNOSIS — Z1231 Encounter for screening mammogram for malignant neoplasm of breast: Secondary | ICD-10-CM | POA: Diagnosis not present

## 2013-05-27 DIAGNOSIS — D1739 Benign lipomatous neoplasm of skin and subcutaneous tissue of other sites: Secondary | ICD-10-CM | POA: Diagnosis not present

## 2013-05-27 DIAGNOSIS — M19049 Primary osteoarthritis, unspecified hand: Secondary | ICD-10-CM | POA: Diagnosis not present

## 2013-06-09 DIAGNOSIS — D239 Other benign neoplasm of skin, unspecified: Secondary | ICD-10-CM | POA: Diagnosis not present

## 2013-06-09 DIAGNOSIS — L821 Other seborrheic keratosis: Secondary | ICD-10-CM | POA: Diagnosis not present

## 2013-06-09 DIAGNOSIS — L989 Disorder of the skin and subcutaneous tissue, unspecified: Secondary | ICD-10-CM | POA: Diagnosis not present

## 2013-06-09 DIAGNOSIS — L570 Actinic keratosis: Secondary | ICD-10-CM | POA: Diagnosis not present

## 2013-06-09 DIAGNOSIS — Z85828 Personal history of other malignant neoplasm of skin: Secondary | ICD-10-CM | POA: Diagnosis not present

## 2013-06-14 DIAGNOSIS — H2510 Age-related nuclear cataract, unspecified eye: Secondary | ICD-10-CM | POA: Diagnosis not present

## 2013-07-22 DIAGNOSIS — M6530 Trigger finger, unspecified finger: Secondary | ICD-10-CM | POA: Diagnosis not present

## 2013-08-02 ENCOUNTER — Encounter: Payer: Self-pay | Admitting: Gastroenterology

## 2013-08-02 ENCOUNTER — Other Ambulatory Visit (INDEPENDENT_AMBULATORY_CARE_PROVIDER_SITE_OTHER): Payer: Medicare Other

## 2013-08-02 ENCOUNTER — Ambulatory Visit (INDEPENDENT_AMBULATORY_CARE_PROVIDER_SITE_OTHER): Payer: Medicare Other | Admitting: Gastroenterology

## 2013-08-02 VITALS — BP 120/74 | HR 68 | Ht 60.24 in | Wt 170.1 lb

## 2013-08-02 DIAGNOSIS — Z86010 Personal history of colonic polyps: Secondary | ICD-10-CM | POA: Diagnosis not present

## 2013-08-02 DIAGNOSIS — K589 Irritable bowel syndrome without diarrhea: Secondary | ICD-10-CM

## 2013-08-02 DIAGNOSIS — K219 Gastro-esophageal reflux disease without esophagitis: Secondary | ICD-10-CM

## 2013-08-02 LAB — IGA: IgA: 239 mg/dL (ref 68–378)

## 2013-08-02 MED ORDER — GLYCOPYRROLATE 1 MG PO TABS: 1.0000 mg | ORAL_TABLET | Freq: Two times a day (BID) | ORAL | Status: DC

## 2013-08-02 MED ORDER — PANTOPRAZOLE SODIUM 40 MG PO TBEC: 40.0000 mg | DELAYED_RELEASE_TABLET | Freq: Every day | ORAL | Status: DC

## 2013-08-02 NOTE — Progress Notes (Signed)
    History of Present Illness: This is a 68 year old female previously followed by Dr. Sharlett Iles. She has a history of IBS-D and GERD. She was previously treated with Librax and Protonix however has not been on medications. She has frequent urgent watery nonbloody diarrhea associated with periumbilical cramping. She has frequent reflux symptoms. She underwent colonoscopy by Dr. Sharlett Iles in June 2014 showing mild diverticulosis and hemorrhoids. She has a grandson with celiac disease. Denies weight loss, constipation, change in stool caliber, melena, hematochezia, nausea, vomiting, dysphagia, chest pain.  Current Medications, Allergies, Past Medical History, Past Surgical History, Family History and Social History were reviewed in [employer] record.  Physical Exam: General: Well developed , well nourished, no acute distress Head: Normocephalic and atraumatic Eyes:  sclerae anicteric, EOMI Ears: Normal auditory acuity Mouth: No deformity or lesions Lungs: Clear throughout to auscultation Heart: Regular rate and rhythm; no murmurs, rubs or bruits Abdomen: Soft, non tender and non distended. No masses, hepatosplenomegaly or hernias noted. Normal Bowel sounds Musculoskeletal: Symmetrical with no gross deformities  Pulses:  Normal pulses noted Extremities: No clubbing, cyanosis, edema or deformities noted Neurological: Alert oriented x 4, grossly nonfocal Psychological:  Alert and cooperative. Normal mood and affect  Assessment and Recommendations:  1. IBS-D. Obtain tTG and IgA. Begin glycopyrrolate 1 mg by mouth twice a day. Avoid foods that trigger symptoms. Continue daily probiotic and consider changing brands. Consider FODMAP diet and/or Lotronex if symptoms are not adequately controlled. Return office visit one month.  2. GERD. Standard antireflux measures and Protonix 40 mg daily.  3. Personal history of adenomatous colon polyps. Surveillance colonoscopy  recommended a 5 year interval, June 2019.

## 2013-08-02 NOTE — Patient Instructions (Addendum)
Your physician has requested that you go to the basement for the following lab work before leaving today: TTG, IGA.  We have sent the following medications to your pharmacy for you to pick up at your convenience:Protonix and Robinul.  Thank you for choosing me and [facility].  Pricilla Riffle. Dagoberto Ligas., MD., Marval Regal  cc: Leighton Ruff, MD

## 2013-08-03 LAB — TISSUE TRANSGLUTAMINASE, IGA: TISSUE TRANSGLUTAMINASE AB, IGA: 4 U/mL (ref <20)

## 2013-08-04 ENCOUNTER — Telehealth: Payer: Self-pay | Admitting: Gastroenterology

## 2013-08-04 NOTE — Telephone Encounter (Signed)
Patient feels that glycopyrrolate may be causing diarrhea.  She is advised to give it a little longer as that is why she was seen in the office.  She is advised that she can also hold the medication and see if her symptoms improve off of glycopyrrolate.  She will call back with an update.  I discussed the next step was a FODMap diet then possibly Lotronex according to her chart.  She will call back with any additional questions or concerns and an update if her symptoms don't improve

## 2013-08-05 ENCOUNTER — Telehealth: Payer: Self-pay | Admitting: Gastroenterology

## 2013-08-05 MED ORDER — CILIDINIUM-CHLORDIAZEPOXIDE 2.5-5 MG PO CAPS: 1.0000 | ORAL_CAPSULE | Freq: Three times a day (TID) | ORAL | Status: DC | PRN

## 2013-08-05 NOTE — Telephone Encounter (Signed)
Patient notified of Dr. Lynne Leader recommendations.  She will call back for additional questions or concerns

## 2013-08-05 NOTE — Telephone Encounter (Signed)
Try Librax tid prn as this has worked for her in the past

## 2013-08-05 NOTE — Telephone Encounter (Signed)
Patient reports that she has terrible diarrhea after taking glycopyrrolate this am and twice yesterday.  She did come to the office for diarrhea, but she says within one hour of taking the glycopyrrolate she has terrible cramping and diarrhea.  .  She feels that this is causing worsening diarrhea.  Is there an alternate medication?

## 2013-09-06 ENCOUNTER — Telehealth: Payer: Self-pay | Admitting: Gastroenterology

## 2013-09-06 ENCOUNTER — Ambulatory Visit (INDEPENDENT_AMBULATORY_CARE_PROVIDER_SITE_OTHER): Payer: Medicare Other | Admitting: Gastroenterology

## 2013-09-06 ENCOUNTER — Encounter: Payer: Self-pay | Admitting: Gastroenterology

## 2013-09-06 VITALS — BP 116/80 | HR 64 | Ht 60.24 in | Wt 168.5 lb

## 2013-09-06 DIAGNOSIS — K589 Irritable bowel syndrome without diarrhea: Secondary | ICD-10-CM

## 2013-09-06 MED ORDER — RIFAXIMIN 550 MG PO TABS: 550.0000 mg | ORAL_TABLET | Freq: Three times a day (TID) | ORAL | Status: DC

## 2013-09-06 NOTE — Patient Instructions (Signed)
We have sent the following medications to your pharmacy for you to pick up at your convenience: Xifaxan to take one tablet by mouth three times a day x 14 days.   If you see no improvement in symptoms then please start the Fodmap diet.   Thank you for choosing me and [facility].  Pricilla Riffle. Dagoberto Ligas., MD., Marval Regal

## 2013-09-06 NOTE — Progress Notes (Signed)
    History of Present Illness: This is a 68 year old female with IVSD. She noted an improvement in symptoms with increasing her dose of Librax however she became fatigued and mildly confused so she decreased her dosing to as needed. She still has episodes of urgent watery diarrhea up to 2 or 3 times per day. Occasionally she has days with normal stools.  Current Medications, Allergies, Past Medical History, Past Surgical History, Family History and Social History were reviewed in [employer] record.  Physical Exam: General: Well developed , well nourished, no acute distress Psychological:  Alert and cooperative. Normal mood and affect No additional exam performed today  Assessment and Recommendations:  1. IBS-D. tTG and IgA were normal. Decrease Librax to once daily as needed. Xifaxan 550 mg tid for 2 weeks and then FODMAP diet if symptoms not resolved. Consider Lotronex if symptoms are not adequately controlled. Return office visit 2 months.   2. GERD. Standard antireflux measures and Protonix 40 mg daily.   3. Personal history of adenomatous colon polyps. Surveillance colonoscopy recommended a 5 year interval, June 2019.

## 2013-09-06 NOTE — Telephone Encounter (Signed)
Patient states it is over 700 dollars and the coupon we gave her does not work since she is Medicare. Told patient we do have some samples of Xifaxan but we only have enough for 9 days and only if she takes it twice daily instead of three times a day. Told patient is may not work as effectively as it would when she takes the entire treatment. Pt states she will take the 9 days worth and if not any better with then do the Fodmap diet at outlined at visit today. Samples left up front.

## 2013-09-18 DIAGNOSIS — N390 Urinary tract infection, site not specified: Secondary | ICD-10-CM | POA: Diagnosis not present

## 2013-09-18 DIAGNOSIS — R350 Frequency of micturition: Secondary | ICD-10-CM | POA: Diagnosis not present

## 2013-09-24 DIAGNOSIS — J069 Acute upper respiratory infection, unspecified: Secondary | ICD-10-CM | POA: Diagnosis not present

## 2013-09-24 DIAGNOSIS — H6120 Impacted cerumen, unspecified ear: Secondary | ICD-10-CM | POA: Diagnosis not present

## 2013-09-24 DIAGNOSIS — H93299 Other abnormal auditory perceptions, unspecified ear: Secondary | ICD-10-CM | POA: Diagnosis not present

## 2013-09-30 DIAGNOSIS — F411 Generalized anxiety disorder: Secondary | ICD-10-CM | POA: Diagnosis not present

## 2013-09-30 DIAGNOSIS — E7800 Pure hypercholesterolemia, unspecified: Secondary | ICD-10-CM | POA: Diagnosis not present

## 2013-09-30 DIAGNOSIS — Z0000 Encounter for general adult medical examination without abnormal findings: Secondary | ICD-10-CM | POA: Diagnosis not present

## 2013-09-30 DIAGNOSIS — Z1331 Encounter for screening for depression: Secondary | ICD-10-CM | POA: Diagnosis not present

## 2013-09-30 DIAGNOSIS — Z8349 Family history of other endocrine, nutritional and metabolic diseases: Secondary | ICD-10-CM | POA: Diagnosis not present

## 2013-09-30 DIAGNOSIS — Z8673 Personal history of transient ischemic attack (TIA), and cerebral infarction without residual deficits: Secondary | ICD-10-CM | POA: Diagnosis not present

## 2013-09-30 DIAGNOSIS — E559 Vitamin D deficiency, unspecified: Secondary | ICD-10-CM | POA: Diagnosis not present

## 2013-09-30 DIAGNOSIS — Z23 Encounter for immunization: Secondary | ICD-10-CM | POA: Diagnosis not present

## 2013-11-04 DIAGNOSIS — M6530 Trigger finger, unspecified finger: Secondary | ICD-10-CM | POA: Diagnosis not present

## 2013-11-10 ENCOUNTER — Encounter: Payer: Self-pay | Admitting: Gastroenterology

## 2013-11-10 ENCOUNTER — Ambulatory Visit (INDEPENDENT_AMBULATORY_CARE_PROVIDER_SITE_OTHER): Payer: Medicare Other | Admitting: Gastroenterology

## 2013-11-10 VITALS — BP 108/70 | HR 76 | Ht 60.24 in | Wt 168.2 lb

## 2013-11-10 DIAGNOSIS — K589 Irritable bowel syndrome without diarrhea: Secondary | ICD-10-CM

## 2013-11-10 DIAGNOSIS — K219 Gastro-esophageal reflux disease without esophagitis: Secondary | ICD-10-CM

## 2013-11-10 NOTE — Patient Instructions (Signed)
Thank you for choosing me and [facility].  Malcolm T. Stark, Jr., MD., FACG  

## 2013-11-10 NOTE — Progress Notes (Signed)
    History of Present Illness: This is a 68 year old female with IBS-D who returns for followup. Her symptoms have almost completely resolved after a course of Xifaxan and beginning a FODMAP diet.   Current Medications, Allergies, Past Medical History, Past Surgical History, Family History and Social History were reviewed in [employer] record.  Physical Exam: General: Well developed , well nourished, no acute distress Head: Normocephalic and atraumatic Eyes:  sclerae anicteric, EOMI Ears: Normal auditory acuity Mouth: No deformity or lesions Lungs: Clear throughout to auscultation Heart: Regular rate and rhythm; no murmurs, rubs or bruits Abdomen: Soft, non tender and non distended. No masses, hepatosplenomegaly or hernias noted. Normal Bowel sounds Musculoskeletal: Symmetrical with no gross deformities  Pulses:  Normal pulses noted Extremities: No clubbing, cyanosis, edema or deformities noted Neurological: Alert oriented x 4, grossly nonfocal Psychological:  Alert and cooperative. Normal mood and affect  Assessment and Recommendations:  1. IBS-D. Symptoms substantially improved after a course of Xifaxan and following a FODMAP diet. Return office visit one year.   2. GERD. Standard antireflux measures and Protonix 40 mg daily.   3. Personal history of adenomatous colon polyps. Surveillance colonoscopy recommended a 5 year interval, June 2019.

## 2013-11-15 ENCOUNTER — Encounter: Payer: Medicare Other | Admitting: Gastroenterology

## 2013-11-23 DIAGNOSIS — Z23 Encounter for immunization: Secondary | ICD-10-CM | POA: Diagnosis not present

## 2013-12-16 DIAGNOSIS — D1800 Hemangioma unspecified site: Secondary | ICD-10-CM | POA: Diagnosis not present

## 2013-12-16 DIAGNOSIS — L821 Other seborrheic keratosis: Secondary | ICD-10-CM | POA: Diagnosis not present

## 2013-12-16 DIAGNOSIS — Z85828 Personal history of other malignant neoplasm of skin: Secondary | ICD-10-CM | POA: Diagnosis not present

## 2013-12-16 DIAGNOSIS — L814 Other melanin hyperpigmentation: Secondary | ICD-10-CM | POA: Diagnosis not present

## 2013-12-16 DIAGNOSIS — L570 Actinic keratosis: Secondary | ICD-10-CM | POA: Diagnosis not present

## 2013-12-16 DIAGNOSIS — L820 Inflamed seborrheic keratosis: Secondary | ICD-10-CM | POA: Diagnosis not present

## 2013-12-28 DIAGNOSIS — G250 Essential tremor: Secondary | ICD-10-CM | POA: Diagnosis not present

## 2013-12-28 DIAGNOSIS — Z8673 Personal history of transient ischemic attack (TIA), and cerebral infarction without residual deficits: Secondary | ICD-10-CM | POA: Diagnosis not present

## 2013-12-28 DIAGNOSIS — M8580 Other specified disorders of bone density and structure, unspecified site: Secondary | ICD-10-CM | POA: Diagnosis not present

## 2013-12-28 DIAGNOSIS — E559 Vitamin D deficiency, unspecified: Secondary | ICD-10-CM | POA: Diagnosis not present

## 2013-12-28 DIAGNOSIS — F419 Anxiety disorder, unspecified: Secondary | ICD-10-CM | POA: Diagnosis not present

## 2013-12-28 DIAGNOSIS — K589 Irritable bowel syndrome without diarrhea: Secondary | ICD-10-CM | POA: Diagnosis not present

## 2013-12-28 DIAGNOSIS — E782 Mixed hyperlipidemia: Secondary | ICD-10-CM | POA: Diagnosis not present

## 2013-12-28 DIAGNOSIS — J309 Allergic rhinitis, unspecified: Secondary | ICD-10-CM | POA: Diagnosis not present

## 2014-01-23 DIAGNOSIS — J069 Acute upper respiratory infection, unspecified: Secondary | ICD-10-CM | POA: Diagnosis not present

## 2014-02-21 DIAGNOSIS — J309 Allergic rhinitis, unspecified: Secondary | ICD-10-CM | POA: Diagnosis not present

## 2014-02-21 DIAGNOSIS — J019 Acute sinusitis, unspecified: Secondary | ICD-10-CM | POA: Diagnosis not present

## 2014-03-02 DIAGNOSIS — J40 Bronchitis, not specified as acute or chronic: Secondary | ICD-10-CM | POA: Diagnosis not present

## 2014-03-03 DIAGNOSIS — M7062 Trochanteric bursitis, left hip: Secondary | ICD-10-CM | POA: Diagnosis not present

## 2014-03-15 DIAGNOSIS — D485 Neoplasm of uncertain behavior of skin: Secondary | ICD-10-CM | POA: Diagnosis not present

## 2014-03-15 DIAGNOSIS — C44311 Basal cell carcinoma of skin of nose: Secondary | ICD-10-CM | POA: Diagnosis not present

## 2014-03-15 DIAGNOSIS — L570 Actinic keratosis: Secondary | ICD-10-CM | POA: Diagnosis not present

## 2014-03-31 DIAGNOSIS — C44311 Basal cell carcinoma of skin of nose: Secondary | ICD-10-CM | POA: Diagnosis not present

## 2014-04-05 DIAGNOSIS — F419 Anxiety disorder, unspecified: Secondary | ICD-10-CM | POA: Diagnosis not present

## 2014-04-05 DIAGNOSIS — E559 Vitamin D deficiency, unspecified: Secondary | ICD-10-CM | POA: Diagnosis not present

## 2014-04-05 DIAGNOSIS — R7301 Impaired fasting glucose: Secondary | ICD-10-CM | POA: Diagnosis not present

## 2014-04-05 DIAGNOSIS — E782 Mixed hyperlipidemia: Secondary | ICD-10-CM | POA: Diagnosis not present

## 2014-04-05 DIAGNOSIS — G250 Essential tremor: Secondary | ICD-10-CM | POA: Diagnosis not present

## 2014-04-06 DIAGNOSIS — F419 Anxiety disorder, unspecified: Secondary | ICD-10-CM | POA: Diagnosis not present

## 2014-04-06 DIAGNOSIS — E559 Vitamin D deficiency, unspecified: Secondary | ICD-10-CM | POA: Diagnosis not present

## 2014-04-06 DIAGNOSIS — E782 Mixed hyperlipidemia: Secondary | ICD-10-CM | POA: Diagnosis not present

## 2014-04-06 DIAGNOSIS — R7301 Impaired fasting glucose: Secondary | ICD-10-CM | POA: Diagnosis not present

## 2014-04-25 DIAGNOSIS — Z1231 Encounter for screening mammogram for malignant neoplasm of breast: Secondary | ICD-10-CM | POA: Diagnosis not present

## 2014-04-25 DIAGNOSIS — M810 Age-related osteoporosis without current pathological fracture: Secondary | ICD-10-CM | POA: Diagnosis not present

## 2014-05-19 DIAGNOSIS — M25552 Pain in left hip: Secondary | ICD-10-CM | POA: Diagnosis not present

## 2014-05-19 DIAGNOSIS — M25562 Pain in left knee: Secondary | ICD-10-CM | POA: Diagnosis not present

## 2014-05-20 DIAGNOSIS — M8580 Other specified disorders of bone density and structure, unspecified site: Secondary | ICD-10-CM | POA: Diagnosis not present

## 2014-06-02 DIAGNOSIS — Z96652 Presence of left artificial knee joint: Secondary | ICD-10-CM | POA: Diagnosis not present

## 2014-06-02 DIAGNOSIS — Z471 Aftercare following joint replacement surgery: Secondary | ICD-10-CM | POA: Diagnosis not present

## 2014-06-20 DIAGNOSIS — H2513 Age-related nuclear cataract, bilateral: Secondary | ICD-10-CM | POA: Diagnosis not present

## 2014-07-04 DIAGNOSIS — R7301 Impaired fasting glucose: Secondary | ICD-10-CM | POA: Diagnosis not present

## 2014-07-06 DIAGNOSIS — J0100 Acute maxillary sinusitis, unspecified: Secondary | ICD-10-CM | POA: Diagnosis not present

## 2014-07-06 DIAGNOSIS — H6123 Impacted cerumen, bilateral: Secondary | ICD-10-CM | POA: Diagnosis not present

## 2014-07-06 DIAGNOSIS — H93293 Other abnormal auditory perceptions, bilateral: Secondary | ICD-10-CM | POA: Diagnosis not present

## 2014-08-15 DIAGNOSIS — L02213 Cutaneous abscess of chest wall: Secondary | ICD-10-CM | POA: Diagnosis not present

## 2014-08-17 ENCOUNTER — Encounter (HOSPITAL_COMMUNITY): Payer: Self-pay | Admitting: *Deleted

## 2014-08-17 ENCOUNTER — Emergency Department (HOSPITAL_COMMUNITY)
Admission: EM | Admit: 2014-08-17 | Discharge: 2014-08-17 | Disposition: A | Discharge status: Home / Self Care | Payer: Medicare Other | Attending: Emergency Medicine | Admitting: Emergency Medicine

## 2014-08-17 DIAGNOSIS — Z79899 Other long term (current) drug therapy: Secondary | ICD-10-CM | POA: Insufficient documentation

## 2014-08-17 DIAGNOSIS — K219 Gastro-esophageal reflux disease without esophagitis: Secondary | ICD-10-CM | POA: Insufficient documentation

## 2014-08-17 DIAGNOSIS — N61 Inflammatory disorders of breast: Secondary | ICD-10-CM | POA: Insufficient documentation

## 2014-08-17 DIAGNOSIS — Z87891 Personal history of nicotine dependence: Secondary | ICD-10-CM | POA: Diagnosis not present

## 2014-08-17 DIAGNOSIS — Z792 Long term (current) use of antibiotics: Secondary | ICD-10-CM | POA: Insufficient documentation

## 2014-08-17 DIAGNOSIS — Z86718 Personal history of other venous thrombosis and embolism: Secondary | ICD-10-CM | POA: Insufficient documentation

## 2014-08-17 DIAGNOSIS — N611 Abscess of the breast and nipple: Secondary | ICD-10-CM

## 2014-08-17 DIAGNOSIS — Z7982 Long term (current) use of aspirin: Secondary | ICD-10-CM | POA: Diagnosis not present

## 2014-08-17 DIAGNOSIS — R6883 Chills (without fever): Secondary | ICD-10-CM | POA: Diagnosis not present

## 2014-08-17 DIAGNOSIS — Z8673 Personal history of transient ischemic attack (TIA), and cerebral infarction without residual deficits: Secondary | ICD-10-CM | POA: Insufficient documentation

## 2014-08-17 DIAGNOSIS — Z880 Allergy status to penicillin: Secondary | ICD-10-CM | POA: Insufficient documentation

## 2014-08-17 DIAGNOSIS — Z8669 Personal history of other diseases of the nervous system and sense organs: Secondary | ICD-10-CM | POA: Insufficient documentation

## 2014-08-17 DIAGNOSIS — M1990 Unspecified osteoarthritis, unspecified site: Secondary | ICD-10-CM | POA: Insufficient documentation

## 2014-08-17 DIAGNOSIS — L02213 Cutaneous abscess of chest wall: Secondary | ICD-10-CM | POA: Diagnosis not present

## 2014-08-17 DIAGNOSIS — Z7951 Long term (current) use of inhaled steroids: Secondary | ICD-10-CM | POA: Insufficient documentation

## 2014-08-17 DIAGNOSIS — F419 Anxiety disorder, unspecified: Secondary | ICD-10-CM | POA: Diagnosis not present

## 2014-08-17 DIAGNOSIS — Z85828 Personal history of other malignant neoplasm of skin: Secondary | ICD-10-CM | POA: Diagnosis not present

## 2014-08-17 LAB — COMPREHENSIVE METABOLIC PANEL
ALK PHOS: 68 U/L (ref 38–126)
ALT: 14 U/L (ref 14–54)
AST: 16 U/L (ref 15–41)
Albumin: 4 g/dL (ref 3.5–5.0)
Anion gap: 9 (ref 5–15)
BUN: 13 mg/dL (ref 6–20)
CO2: 24 mmol/L (ref 22–32)
Calcium: 9.3 mg/dL (ref 8.9–10.3)
Chloride: 105 mmol/L (ref 101–111)
Creatinine, Ser: 0.59 mg/dL (ref 0.44–1.00)
GLUCOSE: 94 mg/dL (ref 65–99)
POTASSIUM: 4.2 mmol/L (ref 3.5–5.1)
SODIUM: 138 mmol/L (ref 135–145)
Total Bilirubin: 0.5 mg/dL (ref 0.3–1.2)
Total Protein: 7 g/dL (ref 6.5–8.1)

## 2014-08-17 LAB — CBC WITH DIFFERENTIAL/PLATELET
BASOS ABS: 0 10*3/uL (ref 0.0–0.1)
Basophils Relative: 0 % (ref 0–1)
EOS PCT: 1 % (ref 0–5)
Eosinophils Absolute: 0.1 10*3/uL (ref 0.0–0.7)
HEMATOCRIT: 40.5 % (ref 36.0–46.0)
Hemoglobin: 13.1 g/dL (ref 12.0–15.0)
Lymphocytes Relative: 18 % (ref 12–46)
Lymphs Abs: 3.1 10*3/uL (ref 0.7–4.0)
MCH: 28.7 pg (ref 26.0–34.0)
MCHC: 32.3 g/dL (ref 30.0–36.0)
MCV: 88.6 fL (ref 78.0–100.0)
MONO ABS: 1.7 10*3/uL — AB (ref 0.1–1.0)
MONOS PCT: 10 % (ref 3–12)
NEUTROS ABS: 12.4 10*3/uL — AB (ref 1.7–7.7)
Neutrophils Relative %: 71 % (ref 43–77)
Platelets: 334 10*3/uL (ref 150–400)
RBC: 4.57 MIL/uL (ref 3.87–5.11)
RDW: 14.2 % (ref 11.5–15.5)
WBC: 17.3 10*3/uL — AB (ref 4.0–10.5)

## 2014-08-17 MED ORDER — LIDOCAINE-EPINEPHRINE 2 %-1:100000 IJ SOLN
20.0000 mL | Freq: Once | INTRAMUSCULAR | Status: AC
  Administered 2014-08-17: 20 mL via INTRADERMAL
  Filled 2014-08-17: qty 1

## 2014-08-17 MED ORDER — LIDOCAINE-EPINEPHRINE (PF) 2 %-1:200000 IJ SOLN: 10.0000 mL | Freq: Once | INTRAMUSCULAR | Status: DC

## 2014-08-17 NOTE — ED Notes (Signed)
Pt reports abscess under left breast, was seen on 6/13 and started on abx. Went back to pcp today for follow up. Abscess larger, pt having fevers. Pain 9/10.

## 2014-08-17 NOTE — ED Notes (Signed)
Pt comes in today with a c/o abscess. Pt states she noticed this last Thursday. Pt states she saw her PCP on Monday and was given an abx. Pt states that she felt it wasn't getting any better so that is why she is here today. Pt states she did begin to run a fever with this and noted redness and warmth to the site. The abscess is located under her right breast.

## 2014-08-17 NOTE — Discharge Instructions (Signed)
Ibuprofen or tylenol for pain. Continue doxycycline. Warm compresses. Follow up with your doctor in 2 days.    Abscess An abscess is an infected area that contains a collection of pus and debris.It can occur in almost any part of the body. An abscess is also known as a furuncle or boil. CAUSES  An abscess occurs when tissue gets infected. This can occur from blockage of oil or sweat glands, infection of hair follicles, or a minor injury to the skin. As the body tries to fight the infection, pus collects in the area and creates pressure under the skin. This pressure causes pain. People with weakened immune systems have difficulty fighting infections and get certain abscesses more often.  SYMPTOMS Usually an abscess develops on the skin and becomes a painful mass that is red, warm, and tender. If the abscess forms under the skin, you may feel a moveable soft area under the skin. Some abscesses break open (rupture) on their own, but most will continue to get worse without care. The infection can spread deeper into the body and eventually into the bloodstream, causing you to feel ill.  DIAGNOSIS  Your caregiver will take your medical history and perform a physical exam. A sample of fluid may also be taken from the abscess to determine what is causing your infection. TREATMENT  Your caregiver may prescribe antibiotic medicines to fight the infection. However, taking antibiotics alone usually does not cure an abscess. Your caregiver may need to make a small cut (incision) in the abscess to drain the pus. In some cases, gauze is packed into the abscess to reduce pain and to continue draining the area. HOME CARE INSTRUCTIONS   Only take over-the-counter or prescription medicines for pain, discomfort, or fever as directed by your caregiver.  If you were prescribed antibiotics, take them as directed. Finish them even if you start to feel better.  If gauze is used, follow your caregiver's directions for  changing the gauze.  To avoid spreading the infection:  Keep your draining abscess covered with a bandage.  Wash your hands well.  Do not share personal care items, towels, or whirlpools with others.  Avoid skin contact with others.  Keep your skin and clothes clean around the abscess.  Keep all follow-up appointments as directed by your caregiver. SEEK MEDICAL CARE IF:   You have increased pain, swelling, redness, fluid drainage, or bleeding.  You have muscle aches, chills, or a general ill feeling.  You have a fever. MAKE SURE YOU:   Understand these instructions.  Will watch your condition.  Will get help right away if you are not doing well or get worse. Document Released: 11/28/2004 Document Revised: 08/20/2011 Document Reviewed: 05/03/2011 [facility] Patient Information 2015 [place], [place]. This information is not intended to replace advice given to you by your health care provider. Make sure you discuss any questions you have with your health care provider.

## 2014-08-17 NOTE — ED Provider Notes (Signed)
CSN: [id number]     Arrival date & time 08/17/14  1616 History   First MD Initiated Contact with Patient 08/17/14 1747     Chief Complaint  Patient presents with  . Abscess     (Consider location/radiation/quality/duration/timing/severity/associated sxs/prior Treatment) HPI Erica Alvarez is a 69 y.o. female with a history of arthritis, CVA, peripheral vascular disease, presents to emergency department complaining of tender area under right breast. Patient states symptoms began approximately 4 days ago. She went to her primary care doctor 2 days ago was diagnosed with an abscess and was put on doxycycline. Abscess was not incised or drained. Patient states she went back for reassessment today because of getting larger, and she was sent here for further evaluation. Patient denies any fever, complaining of chills. She states area-size in the last 2 days. She denies any drainage. She has been applying warm compresses which she states helps. No nausea, vomiting. No other associated symptoms.  Past Medical History  Diagnosis Date  . Arthritis   . Stroke     2007 - no deficits per patient  . GERD (gastroesophageal reflux disease)   . Cancer     basal cell cancer on nose   . Neuromuscular disorder     benign essential tremors - reason for Inderal   . Anxiety     occasional  . Peripheral vascular disease 2003    DVT  left leg post op   Past Surgical History  Procedure Laterality Date  . Laparoscopic cholecystectomy  1990  . Abdominal hysterectomy    . Tonsillectomy    . Patent foramen ovale closure  2007  . Total knee arthroplasty  12/10/2011    Procedure: TOTAL KNEE ARTHROPLASTY;  Surgeon: [name], MD;  Location: [facility];  Service: Orthopedics;  Laterality: Left;  . Joint replacement  2010    right knee /left knee  . Total hip arthroplasty Right 04/29/2012    Procedure: TOTAL HIP ARTHROPLASTY ANTERIOR APPROACH;  Surgeon: [name], MD;  Location: [facility];  Service:  Orthopedics;  Laterality: Right;   Family History  Problem Relation Age of Onset  . Dementia Mother   . Cancer - Colon Father   . Colon cancer Father 20   History  Substance Use Topics  . Smoking status: Former Smoker    Quit date: 03/04/1980  . Smokeless tobacco: Never Used  . Alcohol Use: Yes     Comment: occ glass of wine    OB History    No data available     Review of Systems  Constitutional: Positive for chills. Negative for fever.  Respiratory: Negative for cough, chest tightness and shortness of breath.   Cardiovascular: Negative for chest pain, palpitations and leg swelling.  Gastrointestinal: Negative for nausea, vomiting, abdominal pain and diarrhea.  Genitourinary: Negative for dysuria and flank pain.  Musculoskeletal: Negative for myalgias, arthralgias, neck pain and neck stiffness.  Skin: Positive for wound. Negative for rash.  Allergic/Immunologic: Negative for immunocompromised state.  Neurological: Negative for dizziness, weakness and headaches.  All other systems reviewed and are negative.     Allergies  Penicillins; Sulfonamide derivatives; and Tetracycline  Home Medications   Prior to Admission medications   Medication Sig Start Date End Date Taking? Authorizing Provider  acetaminophen (TYLENOL) 500 MG tablet Take 500 mg by mouth every 6 (six) hours as needed for moderate pain. For pain   Yes Historical Provider, MD  alendronate (FOSAMAX) 70 MG tablet Take 70 mg by  mouth once a week. Monday   Yes Historical Provider, MD  aspirin 81 MG tablet Take 81 mg by mouth daily.   Yes Historical Provider, MD  Cholecalciferol (VITAMIN D) 1000 UNITS capsule Take 3,000 Units by mouth daily.   Yes Historical Provider, MD  doxycycline (DORYX) 100 MG EC tablet Take 100 mg by mouth 2 (two) times daily.   Yes Historical Provider, MD  fenofibrate (TRICOR) 48 MG tablet Take 48 mg by mouth daily.   Yes Historical Provider, MD  fish oil-omega-3 fatty acids 1000 MG capsule  Take 1 g by mouth daily.   Yes Historical Provider, MD  fluticasone (FLONASE) 50 MCG/ACT nasal spray Place 1 spray into both nostrils daily.  08/04/12  Yes Historical Provider, MD  loperamide (IMODIUM A-D) 2 MG tablet Take 2 mg by mouth 4 (four) times daily as needed for diarrhea or loose stools.   Yes Historical Provider, MD  loratadine (CLARITIN) 10 MG tablet Take 10 mg by mouth daily.   Yes Historical Provider, MD  Multiple Vitamin (MULTIVITAMIN) tablet Take 1 tablet by mouth daily.   Yes Historical Provider, MD  polyvinyl alcohol (LIQUIFILM TEARS) 1.4 % ophthalmic solution Place 2 drops into both eyes daily as needed for dry eyes.   Yes Historical Provider, MD  propranolol (INDERAL) 10 MG tablet Take 10 mg by mouth 2 (two) times daily.   Yes Historical Provider, MD  clidinium-chlordiazePOXIDE (LIBRAX) 5-2.5 MG per capsule Take 1 capsule by mouth 3 (three) times daily with meals as needed. Patient not taking: Reported on 08/17/2014 08/05/13   Ladene Artist, MD  diazepam (VALIUM) 5 MG tablet Take 1 tablet (5 mg total) by mouth every 8 (eight) hours as needed for anxiety. Patient not taking: Reported on 08/17/2014 12/25/12   Carmin Muskrat, MD  pantoprazole (PROTONIX) 40 MG tablet Take 1 tablet (40 mg total) by mouth daily. Patient not taking: Reported on 08/17/2014 08/02/13   Ladene Artist, MD   BP 157/90 mmHg  Pulse 86  Temp(Src) 98.2 F (36.8 C) (Oral)  Resp 19  SpO2 99% Physical Exam  Constitutional: She appears well-developed and well-nourished. No distress.  HENT:  Head: Normocephalic.  Eyes: Conjunctivae are normal.  Neck: Neck supple.  Cardiovascular: Normal rate, regular rhythm and normal heart sounds.   Pulmonary/Chest: Effort normal and breath sounds normal. No respiratory distress. She has no wheezes. She has no rales.  Abdominal: Soft. Bowel sounds are normal. She exhibits no distension. There is no tenderness. There is no rebound.  Musculoskeletal: She exhibits no edema.   Neurological: She is alert.  Skin: Skin is warm and dry.  Area of erythema and induration to the right side right beneath right breast. Areas measuring approximately 5 cm in diameter. There is central fluctuance. Area is tender to palpation. No drainage.  Psychiatric: She has a normal mood and affect. Her behavior is normal.  Nursing note and vitals reviewed.   ED Course  Procedures (including critical care time) Labs Review Labs Reviewed  CBC WITH DIFFERENTIAL/PLATELET - Abnormal; Notable for the following:    WBC 17.3 (*)    Neutro Abs 12.4 (*)    Monocytes Absolute 1.7 (*)    All other components within normal limits  COMPREHENSIVE METABOLIC PANEL    Imaging Review No results found.   EKG Interpretation None      INCISION AND DRAINAGE Performed by: Jeannett Senior A Consent: Verbal consent obtained. Risks and benefits: risks, benefits and alternatives were discussed Type: abscess  Body area: right breast  Anesthesia: local infiltration  Incision was made with a scalpel.  Local anesthetic: lidocaine 2% w epinephrine  Anesthetic total: 4 ml  Complexity: complex Blunt dissection to break up loculations  Drainage: purulent  Drainage amount: large  Packing material: 1/4 in iodoform gauze  Patient tolerance: Patient tolerated the procedure well with no immediate complications.    MDM   Final diagnoses:  Abscess of right breast    Patient with left chest wall abscess, just underneath the right breast at the bra line. She is afebrile here, nontoxic appearing. No vital signs abnormality other than mild hypertension. Blood work obtained by triage nurse shows elevated white blood cell count of 17.3. She has no systemic symptoms. She has been on doxycycline for 2 days, total of 4 doses. Abscess was incised and drained by me in emergency department. Large purulent drainage. Packed. We'll continue doxycycline, warm compresses, recheck in 2 days. Patient agreed  to the plan and voiced understanding.  Filed Vitals:   08/17/14 1626  BP: 157/90  Pulse: 86  Temp: 98.2 F (36.8 C)  TempSrc: Oral  Resp: 19  SpO2: 99%       Jeannett Senior, PA-C 08/18/14 0013  Orlie Dakin, MD 08/18/14 6568

## 2014-08-17 NOTE — ED Provider Notes (Signed)
Patient with painful swollen area immediately around her left breast onset 6 days ago. No abdominal pain no nausea or vomiting no other associated symptoms She states that is more painful today. On exam patient has an approximate 5 cm reddened tender area immediately inferior to left breast.  Orlie Dakin, MD 08/17/14 Vernelle Emerald

## 2014-08-19 DIAGNOSIS — L02213 Cutaneous abscess of chest wall: Secondary | ICD-10-CM | POA: Diagnosis not present

## 2014-08-22 DIAGNOSIS — L02213 Cutaneous abscess of chest wall: Secondary | ICD-10-CM | POA: Diagnosis not present

## 2014-08-24 DIAGNOSIS — L02213 Cutaneous abscess of chest wall: Secondary | ICD-10-CM | POA: Diagnosis not present

## 2014-08-25 DIAGNOSIS — L02213 Cutaneous abscess of chest wall: Secondary | ICD-10-CM | POA: Diagnosis not present

## 2014-08-30 DIAGNOSIS — D72829 Elevated white blood cell count, unspecified: Secondary | ICD-10-CM | POA: Diagnosis not present

## 2014-09-28 DIAGNOSIS — D72829 Elevated white blood cell count, unspecified: Secondary | ICD-10-CM | POA: Diagnosis not present

## 2014-10-18 ENCOUNTER — Other Ambulatory Visit: Payer: No Typology Code available for payment source

## 2014-10-18 ENCOUNTER — Ambulatory Visit: Payer: No Typology Code available for payment source

## 2014-10-18 ENCOUNTER — Ambulatory Visit: Payer: No Typology Code available for payment source | Admitting: Family

## 2014-10-19 ENCOUNTER — Ambulatory Visit: Payer: No Typology Code available for payment source | Admitting: Family

## 2014-10-19 ENCOUNTER — Ambulatory Visit: Payer: No Typology Code available for payment source

## 2014-10-19 ENCOUNTER — Other Ambulatory Visit: Payer: Medicare Other

## 2014-10-24 ENCOUNTER — Telehealth: Payer: Self-pay | Admitting: Hematology & Oncology

## 2014-10-24 NOTE — Telephone Encounter (Signed)
Returned pt's call regarding scheduling a new pt appt. On 9/1. Pt confirmed appt.

## 2014-11-03 ENCOUNTER — Encounter: Payer: Self-pay | Admitting: Family

## 2014-11-03 ENCOUNTER — Other Ambulatory Visit (HOSPITAL_BASED_OUTPATIENT_CLINIC_OR_DEPARTMENT_OTHER): Payer: Medicare Other

## 2014-11-03 ENCOUNTER — Ambulatory Visit: Payer: Medicare Other

## 2014-11-03 ENCOUNTER — Ambulatory Visit (HOSPITAL_BASED_OUTPATIENT_CLINIC_OR_DEPARTMENT_OTHER): Payer: Medicare Other | Admitting: Family

## 2014-11-03 ENCOUNTER — Other Ambulatory Visit: Payer: Self-pay | Admitting: Family

## 2014-11-03 VITALS — BP 124/82 | HR 69 | Temp 98.0°F | Resp 18 | Ht 60.0 in

## 2014-11-03 DIAGNOSIS — D7589 Other specified diseases of blood and blood-forming organs: Secondary | ICD-10-CM | POA: Diagnosis present

## 2014-11-03 DIAGNOSIS — D72829 Elevated white blood cell count, unspecified: Secondary | ICD-10-CM

## 2014-11-03 LAB — CBC WITH DIFFERENTIAL (CANCER CENTER ONLY)
BASO#: 0 10*3/uL (ref 0.0–0.2)
BASO%: 0.2 % (ref 0.0–2.0)
EOS ABS: 0.1 10*3/uL (ref 0.0–0.5)
EOS%: 0.7 % (ref 0.0–7.0)
HCT: 39.6 % (ref 34.8–46.6)
HEMOGLOBIN: 12.9 g/dL (ref 11.6–15.9)
LYMPH#: 4.3 10*3/uL — ABNORMAL HIGH (ref 0.9–3.3)
LYMPH%: 23.6 % (ref 14.0–48.0)
MCH: 29.1 pg (ref 26.0–34.0)
MCHC: 32.6 g/dL (ref 32.0–36.0)
MCV: 89 fL (ref 81–101)
MONO#: 1.2 10*3/uL — ABNORMAL HIGH (ref 0.1–0.9)
MONO%: 6.7 % (ref 0.0–13.0)
NEUT%: 68.8 % (ref 39.6–80.0)
NEUTROS ABS: 12.7 10*3/uL — AB (ref 1.5–6.5)
PLATELETS: 340 10*3/uL (ref 145–400)
RBC: 4.44 10*6/uL (ref 3.70–5.32)
RDW: 14.9 % (ref 11.1–15.7)
WBC: 18.4 10*3/uL — AB (ref 3.9–10.0)

## 2014-11-03 LAB — CHCC SATELLITE - SMEAR

## 2014-11-03 NOTE — Progress Notes (Signed)
Hematology/Oncology Consultation   Name: Erica Alvarez      MRN: [id number]    Location: Room/bed info not found  Date: 11/03/2014 Time:1:18 PM   REFERRING PHYSICIAN: Leighton Ruff, MD  REASON FOR CONSULT: Macrocytosis   DIAGNOSIS: Leukocytosis   HISTORY OF PRESENT ILLNESS: [name] is a very pleasant 69 yo white female with a several year history of leukocytosis. Her WBC count has been as high as 27.8. Today her count is 18.4. She has not had an issue with infections until recently. She has also been under a great deal of stress at home due to her husbands health.  She developed an abscess under her right breast due to the under wire on her bra several months ago that had to be lanced and drained twice and finally cleared up with Clindamycin. This abscess was never cultured.  She then went to church camp with her youth group and ended up with chiggers. This has resolved.  She is now dealing with an "bad" tooth. She had a root canal several weeks ago and developed dry socket. Her dentist cleaned this out and she finished her antibiotic and steroid yesterday. She is still having some pain in her right jaw and if this persists she plans on following up with her dentist next week.  She states that she has no energy and has "lost her spark." She has no anemia and her white cell differential is unremarkable.  She has no personal cancer history. Her father had both colon and stomach cancer.  She has 2 sons both of whom are healthy. No miscarriages.  She had a partial hysterectomy (still has both ovaries) years ago due to endometriosis and a fibroid.  She has had no fever, chills, n/v, cough, rash, dizziness, SOB, chest pain, palpitations, abdominal pain, constipation, diarrhea, difficulty urinating, blood in urine or stool. No swelling, tenderness, numbness or tingling in her extremities. No c/o pain.  She has a healthy appetite and is eating well. She stays hydrated. Her weight is stable. She  denies any significant weight loss or gain.  She is a former smoker. She quit in 1992. She enjoys one glass of wine a week.     ROS: All other 10 point review of systems is negative.   PAST MEDICAL HISTORY:   Past Medical History  Diagnosis Date  . Arthritis   . Stroke     2007 - no deficits per patient  . GERD (gastroesophageal reflux disease)   . Cancer     basal cell cancer on nose   . Neuromuscular disorder     benign essential tremors - reason for Inderal   . Anxiety     occasional  . Peripheral vascular disease 2003    DVT  left leg post op    ALLERGIES: Allergies  Allergen Reactions  . Penicillins Itching and Nausea And Vomiting  . Sulfonamide Derivatives Nausea And Vomiting  . Tetracycline Itching and Nausea And Vomiting      MEDICATIONS:  Current Outpatient Prescriptions on File Prior to Visit  Medication Sig Dispense Refill  . acetaminophen (TYLENOL) 500 MG tablet Take 500 mg by mouth every 6 (six) hours as needed for moderate pain. For pain    . alendronate (FOSAMAX) 70 MG tablet Take 70 mg by mouth once a week. Monday    . aspirin 81 MG tablet Take 81 mg by mouth daily.    . Cholecalciferol (VITAMIN D) 1000 UNITS capsule Take 3,000 Units by mouth daily.    Marland Kitchen  clidinium-chlordiazePOXIDE (LIBRAX) 5-2.5 MG per capsule Take 1 capsule by mouth 3 (three) times daily with meals as needed. (Patient not taking: Reported on 08/17/2014) 60 capsule 3  . diazepam (VALIUM) 5 MG tablet Take 1 tablet (5 mg total) by mouth every 8 (eight) hours as needed for anxiety. (Patient not taking: Reported on 08/17/2014) 30 tablet 0  . doxycycline (DORYX) 100 MG EC tablet Take 100 mg by mouth 2 (two) times daily.    . fenofibrate (TRICOR) 48 MG tablet Take 48 mg by mouth daily.    . fish oil-omega-3 fatty acids 1000 MG capsule Take 1 g by mouth daily.    . fluticasone (FLONASE) 50 MCG/ACT nasal spray Place 1 spray into both nostrils daily.     Marland Kitchen loperamide (IMODIUM A-D) 2 MG tablet Take 2 mg  by mouth 4 (four) times daily as needed for diarrhea or loose stools.    Marland Kitchen loratadine (CLARITIN) 10 MG tablet Take 10 mg by mouth daily.    . Multiple Vitamin (MULTIVITAMIN) tablet Take 1 tablet by mouth daily.    . pantoprazole (PROTONIX) 40 MG tablet Take 1 tablet (40 mg total) by mouth daily. (Patient not taking: Reported on 08/17/2014) 30 tablet 11  . polyvinyl alcohol (LIQUIFILM TEARS) 1.4 % ophthalmic solution Place 2 drops into both eyes daily as needed for dry eyes.    Marland Kitchen propranolol (INDERAL) 10 MG tablet Take 10 mg by mouth 2 (two) times daily.     No current facility-administered medications on file prior to visit.     PAST SURGICAL HISTORY Past Surgical History  Procedure Laterality Date  . Laparoscopic cholecystectomy  1990  . Abdominal hysterectomy    . Tonsillectomy    . Patent foramen ovale closure  2007  . Total knee arthroplasty  12/10/2011    Procedure: TOTAL KNEE ARTHROPLASTY;  Surgeon: [name], MD;  Location: [facility];  Service: Orthopedics;  Laterality: Left;  . Joint replacement  2010    right knee /left knee  . Total hip arthroplasty Right 04/29/2012    Procedure: TOTAL HIP ARTHROPLASTY ANTERIOR APPROACH;  Surgeon: [name], MD;  Location: [facility];  Service: Orthopedics;  Laterality: Right;    FAMILY HISTORY: Family History  Problem Relation Age of Onset  . Dementia Mother   . Cancer - Colon Father   . Colon cancer Father 36    SOCIAL HISTORY:  reports that she quit smoking about 34 years ago. She has never used smokeless tobacco. She reports that she drinks alcohol. She reports that she does not use illicit drugs.  PERFORMANCE STATUS: The patient's performance status is 0 - Asymptomatic  PHYSICAL EXAM: Most Recent Vital Signs: There were no vitals taken for this visit. BP 124/82 mmHg  Pulse 69  Temp(Src) 98 F (36.7 C) (Oral)  Resp 18  Ht 5' (1.524 m)  General Appearance:    Alert, cooperative, no distress, appears stated age  Head:     Normocephalic, without obvious abnormality, atraumatic  Eyes:    PERRL, conjunctiva/corneas clear, EOM's intact, fundi    benign, both eyes        Throat:   Lips, mucosa, and tongue normal; teeth and gums normal  Neck:   Supple, symmetrical, trachea midline, no adenopathy;    thyroid:  no enlargement/tenderness/nodules; no carotid   bruit or JVD  Back:     Symmetric, no curvature, ROM normal, no CVA tenderness  Lungs:     Clear to auscultation bilaterally, respirations  unlabored  Chest Wall:    No tenderness or deformity   Heart:    Regular rate and rhythm, S1 and S2 normal, no murmur, rub   or gallop     Abdomen:     Soft, non-tender, bowel sounds active all four quadrants,    no masses, no organomegaly        Extremities:   Extremities normal, atraumatic, no cyanosis or edema  Pulses:   2+ and symmetric all extremities  Skin:   Skin color, texture, turgor normal, no rashes or lesions  Lymph nodes:   Cervical, supraclavicular, and axillary nodes normal  Neurologic:   CNII-XII intact, normal strength, sensation and reflexes    throughout    LABORATORY DATA:  No results found for this or any previous visit (from the past 48 hour(s)).    RADIOGRAPHY: No results found.     PATHOLOGY: None  ASSESSMENT/PLAN: Ms. Crass is a very pleasant 69 yo white female with a several year history of leukocytosis. Her WBC count is 18.4. She is recuperating from a root canal and development of dry socket. She finished her antibiotic and steroid yesterday. She has also been under some stress at home due to her husbands health.  Her white cell differential was unremarkable. Dr. Marin Olp did view her blood smear and was unable to identify any abnormality or evidence of malignancy. She has no anemia.  All questions were answered. She does not need to follow-up with [place] at this time. We would bee happy to see her for any heme/onc issues in the future.   She was discussed with and also seen by Dr. Marin Olp  and he is in agreement with the aforementioned.   [facility]     Addendum:  I saw and examined the patient with Kolt Mcwhirter. I looked at her blood spirits. The blood smear looked reactive.  I just do not see any hematologic issue that we have to deal with. I think that she will always have some degree of leukocytosis. I will see anything that looks like a myeloproliferative syndrome.  She does not need a bone marrow biopsy.  I just don't think we have to get her back to the clinic. Eyes don't think that we are adding to her healthcare. She is being followed very closely by Dr. Drema Dallas.  We spent about 23 Ms. with this would've him. We showed her the lab results. We explained to her our recommendations. She is certainly okay with this.  Lum Keas

## 2014-11-16 DIAGNOSIS — Z01411 Encounter for gynecological examination (general) (routine) with abnormal findings: Secondary | ICD-10-CM | POA: Diagnosis not present

## 2014-11-16 DIAGNOSIS — Z23 Encounter for immunization: Secondary | ICD-10-CM | POA: Diagnosis not present

## 2014-11-16 DIAGNOSIS — R5383 Other fatigue: Secondary | ICD-10-CM | POA: Diagnosis not present

## 2014-11-16 DIAGNOSIS — Z1389 Encounter for screening for other disorder: Secondary | ICD-10-CM | POA: Diagnosis not present

## 2014-11-16 DIAGNOSIS — E782 Mixed hyperlipidemia: Secondary | ICD-10-CM | POA: Diagnosis not present

## 2014-11-16 DIAGNOSIS — E559 Vitamin D deficiency, unspecified: Secondary | ICD-10-CM | POA: Diagnosis not present

## 2014-11-16 DIAGNOSIS — D72829 Elevated white blood cell count, unspecified: Secondary | ICD-10-CM | POA: Diagnosis not present

## 2014-11-16 DIAGNOSIS — M8588 Other specified disorders of bone density and structure, other site: Secondary | ICD-10-CM | POA: Diagnosis not present

## 2014-11-16 DIAGNOSIS — Z0000 Encounter for general adult medical examination without abnormal findings: Secondary | ICD-10-CM | POA: Diagnosis not present

## 2014-11-16 DIAGNOSIS — H6120 Impacted cerumen, unspecified ear: Secondary | ICD-10-CM | POA: Diagnosis not present

## 2014-11-16 DIAGNOSIS — R7301 Impaired fasting glucose: Secondary | ICD-10-CM | POA: Diagnosis not present

## 2014-12-06 DIAGNOSIS — Z471 Aftercare following joint replacement surgery: Secondary | ICD-10-CM | POA: Diagnosis not present

## 2014-12-06 DIAGNOSIS — Z96641 Presence of right artificial hip joint: Secondary | ICD-10-CM | POA: Diagnosis not present

## 2014-12-06 DIAGNOSIS — Z96653 Presence of artificial knee joint, bilateral: Secondary | ICD-10-CM | POA: Diagnosis not present

## 2014-12-06 DIAGNOSIS — M1612 Unilateral primary osteoarthritis, left hip: Secondary | ICD-10-CM | POA: Diagnosis not present

## 2014-12-19 DIAGNOSIS — L989 Disorder of the skin and subcutaneous tissue, unspecified: Secondary | ICD-10-CM | POA: Diagnosis not present

## 2014-12-28 DIAGNOSIS — M8580 Other specified disorders of bone density and structure, unspecified site: Secondary | ICD-10-CM | POA: Diagnosis not present

## 2014-12-28 DIAGNOSIS — N7689 Other specified inflammation of vagina and vulva: Secondary | ICD-10-CM | POA: Diagnosis not present

## 2014-12-28 DIAGNOSIS — Z8673 Personal history of transient ischemic attack (TIA), and cerebral infarction without residual deficits: Secondary | ICD-10-CM | POA: Diagnosis not present

## 2014-12-28 DIAGNOSIS — Z90710 Acquired absence of both cervix and uterus: Secondary | ICD-10-CM | POA: Diagnosis not present

## 2014-12-28 DIAGNOSIS — N952 Postmenopausal atrophic vaginitis: Secondary | ICD-10-CM | POA: Diagnosis not present

## 2015-01-10 DIAGNOSIS — M65331 Trigger finger, right middle finger: Secondary | ICD-10-CM | POA: Diagnosis not present

## 2015-01-17 DIAGNOSIS — L820 Inflamed seborrheic keratosis: Secondary | ICD-10-CM | POA: Diagnosis not present

## 2015-01-17 DIAGNOSIS — L821 Other seborrheic keratosis: Secondary | ICD-10-CM | POA: Diagnosis not present

## 2015-01-17 DIAGNOSIS — L570 Actinic keratosis: Secondary | ICD-10-CM | POA: Diagnosis not present

## 2015-01-17 DIAGNOSIS — Z85828 Personal history of other malignant neoplasm of skin: Secondary | ICD-10-CM | POA: Diagnosis not present

## 2015-01-17 DIAGNOSIS — D225 Melanocytic nevi of trunk: Secondary | ICD-10-CM | POA: Diagnosis not present

## 2015-01-17 DIAGNOSIS — L814 Other melanin hyperpigmentation: Secondary | ICD-10-CM | POA: Diagnosis not present

## 2015-01-17 DIAGNOSIS — L851 Acquired keratosis [keratoderma] palmaris et plantaris: Secondary | ICD-10-CM | POA: Diagnosis not present

## 2015-01-30 DIAGNOSIS — L600 Ingrowing nail: Secondary | ICD-10-CM | POA: Diagnosis not present

## 2015-01-30 DIAGNOSIS — M2570 Osteophyte, unspecified joint: Secondary | ICD-10-CM | POA: Diagnosis not present

## 2015-01-30 DIAGNOSIS — M79609 Pain in unspecified limb: Secondary | ICD-10-CM | POA: Diagnosis not present

## 2015-03-01 ENCOUNTER — Ambulatory Visit: Payer: Self-pay | Admitting: Orthopedic Surgery

## 2015-03-01 NOTE — Progress Notes (Signed)
Preoperative surgical orders have been place into the Epic hospital system for Erica Alvarez on 03/01/2015, 3:11 PM  by Mickel Crow for surgery on 03-22-2015.  Preop Knee Scope orders including IV Tylenol and IV Decadron as long as there are no contraindications to the above medications. Arlee Muslim, PA-C

## 2015-03-14 DIAGNOSIS — J329 Chronic sinusitis, unspecified: Secondary | ICD-10-CM | POA: Diagnosis not present

## 2015-03-15 ENCOUNTER — Encounter (HOSPITAL_BASED_OUTPATIENT_CLINIC_OR_DEPARTMENT_OTHER): Payer: Self-pay | Admitting: *Deleted

## 2015-03-15 NOTE — Progress Notes (Signed)
NPO AFTER MN.  ARRIVE AT [id number].  NEEDS HG.  WILL TAKE AM MEDS W/ SIPS OF WATER DOS.

## 2015-03-22 ENCOUNTER — Encounter (HOSPITAL_BASED_OUTPATIENT_CLINIC_OR_DEPARTMENT_OTHER): Payer: Self-pay | Admitting: *Deleted

## 2015-03-22 ENCOUNTER — Ambulatory Visit (HOSPITAL_BASED_OUTPATIENT_CLINIC_OR_DEPARTMENT_OTHER): Payer: Medicare Other | Admitting: Anesthesiology

## 2015-03-22 ENCOUNTER — Encounter (HOSPITAL_BASED_OUTPATIENT_CLINIC_OR_DEPARTMENT_OTHER)
Admission: RE | Disposition: A | Discharge status: Home / Self Care | Payer: Self-pay | Source: Ambulatory Visit | Attending: Orthopedic Surgery

## 2015-03-22 ENCOUNTER — Ambulatory Visit (HOSPITAL_BASED_OUTPATIENT_CLINIC_OR_DEPARTMENT_OTHER)
Admission: RE | Admit: 2015-03-22 | Discharge: 2015-03-22 | Disposition: A | Discharge status: Home / Self Care | Payer: Medicare Other | Source: Ambulatory Visit | Attending: Orthopedic Surgery | Admitting: Orthopedic Surgery

## 2015-03-22 DIAGNOSIS — Z9049 Acquired absence of other specified parts of digestive tract: Secondary | ICD-10-CM | POA: Diagnosis not present

## 2015-03-22 DIAGNOSIS — Z87891 Personal history of nicotine dependence: Secondary | ICD-10-CM | POA: Insufficient documentation

## 2015-03-22 DIAGNOSIS — Z85828 Personal history of other malignant neoplasm of skin: Secondary | ICD-10-CM | POA: Diagnosis not present

## 2015-03-22 DIAGNOSIS — Z86718 Personal history of other venous thrombosis and embolism: Secondary | ICD-10-CM | POA: Diagnosis not present

## 2015-03-22 DIAGNOSIS — G250 Essential tremor: Secondary | ICD-10-CM | POA: Diagnosis not present

## 2015-03-22 DIAGNOSIS — Z79899 Other long term (current) drug therapy: Secondary | ICD-10-CM | POA: Diagnosis not present

## 2015-03-22 DIAGNOSIS — Z90710 Acquired absence of both cervix and uterus: Secondary | ICD-10-CM | POA: Diagnosis not present

## 2015-03-22 DIAGNOSIS — M65862 Other synovitis and tenosynovitis, left lower leg: Secondary | ICD-10-CM | POA: Diagnosis not present

## 2015-03-22 DIAGNOSIS — M159 Polyosteoarthritis, unspecified: Secondary | ICD-10-CM | POA: Insufficient documentation

## 2015-03-22 DIAGNOSIS — Z7982 Long term (current) use of aspirin: Secondary | ICD-10-CM | POA: Diagnosis not present

## 2015-03-22 DIAGNOSIS — K219 Gastro-esophageal reflux disease without esophagitis: Secondary | ICD-10-CM | POA: Insufficient documentation

## 2015-03-22 DIAGNOSIS — Z8673 Personal history of transient ischemic attack (TIA), and cerebral infarction without residual deficits: Secondary | ICD-10-CM | POA: Diagnosis not present

## 2015-03-22 DIAGNOSIS — M25862 Other specified joint disorders, left knee: Secondary | ICD-10-CM | POA: Insufficient documentation

## 2015-03-22 DIAGNOSIS — Z96652 Presence of left artificial knee joint: Secondary | ICD-10-CM | POA: Insufficient documentation

## 2015-03-22 DIAGNOSIS — M25869 Other specified joint disorders, unspecified knee: Secondary | ICD-10-CM | POA: Diagnosis present

## 2015-03-22 DIAGNOSIS — Z96641 Presence of right artificial hip joint: Secondary | ICD-10-CM | POA: Diagnosis not present

## 2015-03-22 HISTORY — DX: Personal history of other diseases of the digestive system: Z87.19

## 2015-03-22 HISTORY — PX: KNEE ARTHROSCOPY: SHX127

## 2015-03-22 HISTORY — DX: Unspecified osteoarthritis, unspecified site: M19.90

## 2015-03-22 HISTORY — DX: Essential tremor: G25.0

## 2015-03-22 HISTORY — DX: Personal history of (corrected) congenital malformations of heart and circulatory system: Z87.74

## 2015-03-22 HISTORY — DX: Personal history of other malignant neoplasm of skin: Z98.890

## 2015-03-22 HISTORY — DX: Personal history of colonic polyps: Z86.010

## 2015-03-22 HISTORY — DX: Personal history of colon polyps, unspecified: Z86.0100

## 2015-03-22 HISTORY — DX: Personal history of other venous thrombosis and embolism: Z86.718

## 2015-03-22 HISTORY — DX: Acute sinusitis, unspecified: J01.90

## 2015-03-22 HISTORY — PX: SYNOVECTOMY: SHX5180

## 2015-03-22 HISTORY — DX: Personal history of transient ischemic attack (TIA), and cerebral infarction without residual deficits: Z86.73

## 2015-03-22 HISTORY — DX: Other specified joint disorders, left knee: M25.862

## 2015-03-22 HISTORY — DX: Family history of other specified conditions: Z84.89

## 2015-03-22 HISTORY — DX: Other specified postprocedural states: Z85.828

## 2015-03-22 LAB — POCT HEMOGLOBIN-HEMACUE: Hemoglobin: 14 g/dL (ref 12.0–15.0)

## 2015-03-22 SURGERY — ARTHROSCOPY, KNEE
Anesthesia: General | Site: Knee | Laterality: Left

## 2015-03-22 MED ORDER — ACETAMINOPHEN 10 MG/ML IV SOLN
1000.0000 mg | Freq: Once | INTRAVENOUS | Status: AC
  Administered 2015-03-22: 1000 mg via INTRAVENOUS
  Filled 2015-03-22: qty 100

## 2015-03-22 MED ORDER — METHOCARBAMOL 500 MG PO TABS: 500.0000 mg | ORAL_TABLET | Freq: Four times a day (QID) | ORAL | Status: DC

## 2015-03-22 MED ORDER — CHLORHEXIDINE GLUCONATE 4 % EX LIQD
60.0000 mL | Freq: Once | CUTANEOUS | Status: DC
Start: 2015-03-22 — End: 2015-03-22
  Filled 2015-03-22: qty 60

## 2015-03-22 MED ORDER — HYDROCODONE-ACETAMINOPHEN 5-325 MG PO TABS: 1.0000 | ORAL_TABLET | ORAL | Status: DC | PRN

## 2015-03-22 MED ORDER — ACETAMINOPHEN 10 MG/ML IV SOLN
INTRAVENOUS | Status: AC
  Filled 2015-03-22: qty 100

## 2015-03-22 MED ORDER — SODIUM CHLORIDE 0.9 % IR SOLN
Status: DC | PRN
  Administered 2015-03-22: 7000 mL

## 2015-03-22 MED ORDER — PROMETHAZINE HCL 25 MG/ML IJ SOLN
6.2500 mg | INTRAMUSCULAR | Status: DC | PRN
  Filled 2015-03-22: qty 1

## 2015-03-22 MED ORDER — FENTANYL CITRATE (PF) 100 MCG/2ML IJ SOLN
INTRAMUSCULAR | Status: AC
  Filled 2015-03-22: qty 2

## 2015-03-22 MED ORDER — CEFAZOLIN SODIUM-DEXTROSE 2-3 GM-% IV SOLR
2.0000 g | INTRAVENOUS | Status: AC
  Administered 2015-03-22: 2 g via INTRAVENOUS
  Filled 2015-03-22: qty 50

## 2015-03-22 MED ORDER — FENTANYL CITRATE (PF) 100 MCG/2ML IJ SOLN
INTRAMUSCULAR | Status: DC | PRN
  Administered 2015-03-22 (×2): 25 ug via INTRAVENOUS
  Administered 2015-03-22: 50 ug via INTRAVENOUS

## 2015-03-22 MED ORDER — MIDAZOLAM HCL 5 MG/5ML IJ SOLN
INTRAMUSCULAR | Status: DC | PRN
  Administered 2015-03-22 (×2): 1 mg via INTRAVENOUS

## 2015-03-22 MED ORDER — CEFAZOLIN SODIUM-DEXTROSE 2-3 GM-% IV SOLR
INTRAVENOUS | Status: AC
  Filled 2015-03-22: qty 50

## 2015-03-22 MED ORDER — SODIUM CHLORIDE 0.9 % IV SOLN
INTRAVENOUS | Status: DC
  Administered 2015-03-22: 09:00:00 via INTRAVENOUS
  Filled 2015-03-22: qty 1000

## 2015-03-22 MED ORDER — DEXAMETHASONE SODIUM PHOSPHATE 10 MG/ML IJ SOLN
INTRAMUSCULAR | Status: AC
  Filled 2015-03-22: qty 1

## 2015-03-22 MED ORDER — BUPIVACAINE-EPINEPHRINE 0.25% -1:200000 IJ SOLN
INTRAMUSCULAR | Status: DC | PRN
  Administered 2015-03-22: 30 mL

## 2015-03-22 MED ORDER — LIDOCAINE HCL (CARDIAC) 20 MG/ML IV SOLN
INTRAVENOUS | Status: AC
  Filled 2015-03-22: qty 5

## 2015-03-22 MED ORDER — METHOCARBAMOL 500 MG PO TABS
ORAL_TABLET | ORAL | Status: AC
  Filled 2015-03-22: qty 1

## 2015-03-22 MED ORDER — LIDOCAINE HCL (CARDIAC) 20 MG/ML IV SOLN
INTRAVENOUS | Status: DC | PRN
  Administered 2015-03-22: 60 mg via INTRAVENOUS

## 2015-03-22 MED ORDER — OXYCODONE HCL 5 MG PO TABS: 5.0000 mg | ORAL_TABLET | ORAL | Status: DC | PRN

## 2015-03-22 MED ORDER — ONDANSETRON HCL 4 MG/2ML IJ SOLN
INTRAMUSCULAR | Status: AC
  Filled 2015-03-22: qty 2

## 2015-03-22 MED ORDER — LACTATED RINGERS IV SOLN
INTRAVENOUS | Status: DC
  Administered 2015-03-22: 12:00:00 via INTRAVENOUS
  Filled 2015-03-22: qty 1000

## 2015-03-22 MED ORDER — DEXAMETHASONE SODIUM PHOSPHATE 4 MG/ML IJ SOLN: INTRAMUSCULAR | Status: DC | PRN

## 2015-03-22 MED ORDER — PROPOFOL 10 MG/ML IV BOLUS
INTRAVENOUS | Status: DC | PRN
  Administered 2015-03-22: 150 mg via INTRAVENOUS

## 2015-03-22 MED ORDER — TRAMADOL HCL 50 MG PO TABS: 50.0000 mg | ORAL_TABLET | Freq: Four times a day (QID) | ORAL | Status: DC | PRN

## 2015-03-22 MED ORDER — METHOCARBAMOL 500 MG PO TABS
500.0000 mg | ORAL_TABLET | Freq: Once | ORAL | Status: AC
  Administered 2015-03-22: 500 mg via ORAL
  Filled 2015-03-22: qty 1

## 2015-03-22 MED ORDER — ONDANSETRON HCL 4 MG/2ML IJ SOLN
INTRAMUSCULAR | Status: DC | PRN
Start: 2015-03-22 — End: 2015-03-22
  Administered 2015-03-22: 4 mg via INTRAVENOUS

## 2015-03-22 MED ORDER — PROPOFOL 10 MG/ML IV BOLUS
INTRAVENOUS | Status: AC
  Filled 2015-03-22: qty 20

## 2015-03-22 MED ORDER — DEXAMETHASONE SODIUM PHOSPHATE 10 MG/ML IJ SOLN
10.0000 mg | Freq: Once | INTRAMUSCULAR | Status: AC
  Administered 2015-03-22: 10 mg via INTRAVENOUS
  Filled 2015-03-22: qty 1

## 2015-03-22 MED ORDER — MIDAZOLAM HCL 2 MG/2ML IJ SOLN
INTRAMUSCULAR | Status: AC
  Filled 2015-03-22: qty 2

## 2015-03-22 MED ORDER — FENTANYL CITRATE (PF) 100 MCG/2ML IJ SOLN
25.0000 ug | INTRAMUSCULAR | Status: DC | PRN
  Administered 2015-03-22: 25 ug via INTRAVENOUS
  Filled 2015-03-22: qty 1

## 2015-03-22 SURGICAL SUPPLY — 47 items
BANDAGE ELASTIC 6 VELCRO ST LF (GAUZE/BANDAGES/DRESSINGS) ×2 IMPLANT
BLADE 4.2CUDA (BLADE) ×2 IMPLANT
BLADE CUDA SHAVER 3.5 (BLADE) IMPLANT
BLADE CUTTER GATOR 3.5 (BLADE) IMPLANT
CANISTER SUCTION 2500CC (MISCELLANEOUS) IMPLANT
CUFF TOURN SGL QUICK 34 (TOURNIQUET CUFF) ×2
CUFF TRNQT CYL 34X4X40X1 (TOURNIQUET CUFF) ×1 IMPLANT
DRAPE ARTHROSCOPY W/POUCH 114 (DRAPES) ×2 IMPLANT
DRAPE U-SHAPE 47X51 STRL (DRAPES) ×2 IMPLANT
DRSG EMULSION OIL 3X3 NADH (GAUZE/BANDAGES/DRESSINGS) ×2 IMPLANT
DURAPREP 26ML APPLICATOR (WOUND CARE) ×2 IMPLANT
ELECT MENISCUS 165MM 90D (ELECTRODE) IMPLANT
ELECT REM PT RETURN 9FT ADLT (ELECTROSURGICAL)
ELECTRODE REM PT RTRN 9FT ADLT (ELECTROSURGICAL) IMPLANT
GLOVE BIO SURGEON STRL SZ 6.5 (GLOVE) ×2 IMPLANT
GLOVE BIO SURGEON STRL SZ8 (GLOVE) ×2 IMPLANT
GLOVE INDICATOR 6.5 STRL GRN (GLOVE) ×4 IMPLANT
GLOVE INDICATOR 8.0 STRL GRN (GLOVE) ×2 IMPLANT
GOWN STRL REUS W/ TWL LRG LVL3 (GOWN DISPOSABLE) ×2 IMPLANT
GOWN STRL REUS W/ TWL XL LVL3 (GOWN DISPOSABLE) ×1 IMPLANT
GOWN STRL REUS W/TWL LRG LVL3 (GOWN DISPOSABLE) ×2
GOWN STRL REUS W/TWL XL LVL3 (GOWN DISPOSABLE) ×3 IMPLANT
IV NS 1000ML (IV SOLUTION) ×8
IV NS 1000ML BAXH (IV SOLUTION) ×4 IMPLANT
IV NS IRRIG 3000ML ARTHROMATIC (IV SOLUTION) ×2 IMPLANT
KIT ROOM TURNOVER WOR (KITS) ×2 IMPLANT
KNEE WRAP E Z 3 GEL PACK (MISCELLANEOUS) ×2 IMPLANT
MANIFOLD NEPTUNE II (INSTRUMENTS) ×2 IMPLANT
PACK ARTHROSCOPY DSU (CUSTOM PROCEDURE TRAY) ×2 IMPLANT
PACK BASIN DAY SURGERY FS (CUSTOM PROCEDURE TRAY) ×2 IMPLANT
PAD ABD 8X10 STRL (GAUZE/BANDAGES/DRESSINGS) ×2 IMPLANT
PADDING CAST ABS 4INX4YD NS (CAST SUPPLIES) ×1
PADDING CAST ABS 6INX4YD NS (CAST SUPPLIES) ×1
PADDING CAST ABS COTTON 4X4 ST (CAST SUPPLIES) ×1 IMPLANT
PADDING CAST ABS COTTON 6X4 NS (CAST SUPPLIES) ×1 IMPLANT
PADDING CAST COTTON 6X4 STRL (CAST SUPPLIES) ×2 IMPLANT
PENCIL BUTTON HOLSTER BLD 10FT (ELECTRODE) IMPLANT
SET ARTHROSCOPY TUBING (MISCELLANEOUS) ×2
SET ARTHROSCOPY TUBING LN (MISCELLANEOUS) ×1 IMPLANT
SPONGE GAUZE 4X4 12PLY (GAUZE/BANDAGES/DRESSINGS) ×2 IMPLANT
SPONGE GAUZE 4X4 12PLY STER LF (GAUZE/BANDAGES/DRESSINGS) ×2 IMPLANT
SUT ETHILON 4 0 PS 2 18 (SUTURE) ×2 IMPLANT
TOWEL OR 17X24 6PK STRL BLUE (TOWEL DISPOSABLE) ×2 IMPLANT
TUBE CONNECTING 12X1/4 (SUCTIONS) IMPLANT
WAND 30 DEG SABER W/CORD (SURGICAL WAND) IMPLANT
WAND 90 DEG TURBOVAC W/CORD (SURGICAL WAND) ×2 IMPLANT
WATER STERILE IRR 500ML POUR (IV SOLUTION) ×2 IMPLANT

## 2015-03-22 NOTE — Anesthesia Procedure Notes (Signed)
Procedure Name: LMA Insertion Date/Time: 03/22/2015 10:19 AM Performed by: Mechele Claude Pre-anesthesia Checklist: Patient identified, Emergency Drugs available, Suction available and Patient being monitored Patient Re-evaluated:Patient Re-evaluated prior to inductionOxygen Delivery Method: Circle System Utilized Preoxygenation: Pre-oxygenation with 100% oxygen Intubation Type: IV induction Ventilation: Mask ventilation without difficulty LMA: LMA inserted LMA Size: 4.0 Number of attempts: 1 Airway Equipment and Method: bite block Placement Confirmation: positive ETCO2 Tube secured with: Tape Dental Injury: Teeth and Oropharynx as per pre-operative assessment

## 2015-03-22 NOTE — H&P (Signed)
CC- Erica Alvarez is a 70 y.o. female who presents with left knee pain.  HPI- . Knee Pain: Patient presents with knee pain involving the  left knee. Onset of the symptoms was several months ago. Inciting event: She had a left total knee arthroplasty in 2013 and has developed painful popping consistent with patellar clunk syndrome. Current symptoms include crepitus sensation and popping sensation. Pain is aggravated by going up and down stairs and rising after sitting.  Patient has had prior knee problems. Evaluation to date: plain films: normal. Treatment to date: rest.  Past Medical History  Diagnosis Date  . History of CVA (cerebrovascular accident) without residual deficits     [id number]  embolic right frontal MCA -- found to have PFO and Atrial Septum aneursym /  s/p  closure of PFO  . Anxiety   . History of DVT of lower extremity     2003  . Benign essential tremor     takes inderal  . History of basal cell carcinoma excision     nose  . History of colon polyps   . Patellar clunk syndrome of left knee   . Family history of adverse reaction to anesthesia     "sister had episode hypotension"  . OA (osteoarthritis)     knees and hands  . S/P patent foramen ovale closure dx post cva w/ PFO with atrial septal aneursym    07-01-2005  via Intracardiac echo probe with deployment of CardioSEAL septal occluder  . Acute sinus infection     03-14-2014 current taking Z-Pak-- no fever but non-productive cough  . History of gastroesophageal reflux (GERD)     Past Surgical History  Procedure Laterality Date  . Patent foramen ovale closure  07-01-2005  dr Einar Gip    via Intracardiac echocardiogram successful placement CardioSEAL septal occluder (PFO measured about 44mm)  . Total knee arthroplasty  12/10/2011    Procedure: TOTAL KNEE ARTHROPLASTY;  Surgeon: [name], MD;  Location: [facility];  Service: Orthopedics;  Laterality: Left;  . Total hip arthroplasty Right 04/29/2012    Procedure:  TOTAL HIP ARTHROPLASTY ANTERIOR APPROACH;  Surgeon: [name], MD;  Location: [facility];  Service: Orthopedics;  Laterality: Right;  . Abdominal hysterectomy  1982  . Appendectomy  1960's  . Total knee arthroplasty Right 08-08-2008  . Laparoscopic cholecystectomy  1999  . Knee arthroscopy Bilateral left 02-21-2010/  right 11-04-2007    bilateral 2001  . Tonsillectomy  1960's    Prior to Admission medications   Medication Sig Start Date End Date Taking? Authorizing Provider  acetaminophen (TYLENOL) 500 MG tablet Take 500 mg by mouth every 6 (six) hours as needed for moderate pain. For pain   Yes Historical Provider, MD  aspirin 81 MG tablet Take 81 mg by mouth daily.   Yes Historical Provider, MD  azithromycin (ZITHROMAX Z-PAK) 250 MG tablet Take 250 mg by mouth daily.   Yes Historical Provider, MD  Cholecalciferol (VITAMIN D) 1000 UNITS capsule Take 3,000 Units by mouth every evening.    Yes Historical Provider, MD  fenofibrate (TRICOR) 48 MG tablet Take 48 mg by mouth every morning.    Yes Historical Provider, MD  fish oil-omega-3 fatty acids 1000 MG capsule Take 1 g by mouth daily.   Yes Historical Provider, MD  fluticasone (FLONASE) 50 MCG/ACT nasal spray Place 1 spray into both nostrils every morning.  08/04/12  Yes Historical Provider, MD  loperamide (IMODIUM A-D) 2 MG tablet Take 2 mg  by mouth 4 (four) times daily as needed for diarrhea or loose stools.   Yes Historical Provider, MD  loratadine (CLARITIN) 10 MG tablet Take 10 mg by mouth every morning.    Yes Historical Provider, MD  Multiple Vitamin (MULTIVITAMIN) tablet Take 1 tablet by mouth daily.   Yes Historical Provider, MD  polyvinyl alcohol (LIQUIFILM TEARS) 1.4 % ophthalmic solution Place 2 drops into both eyes daily as needed for dry eyes.   Yes Historical Provider, MD  propranolol (INDERAL) 10 MG tablet Take 10 mg by mouth 2 (two) times daily.    Yes Historical Provider, MD   KNEE EXAM antalgic gait, soft tissue  tenderness over anterior knee,no  effusion, collateral ligaments intact, crepitus on ROM with range 0-125  Physical Examination: General appearance - alert, well appearing, and in no distress Mental status - alert, oriented to person, place, and time Chest - clear to auscultation, no wheezes, rales or rhonchi, symmetric air entry Heart - normal rate, regular rhythm, normal S1, S2, no murmurs, rubs, clicks or gallops Abdomen - soft, nontender, nondistended, no masses or organomegaly Neurological - alert, oriented, normal speech, no focal findings or movement disorder noted   Asessment/Plan--- Left knee hypertrophic synovitis- - Plan left knee arthroscopy with synovectomy. Procedure risks and potential comps discussed with patient who elects to proceed. Goals are decreased pain and increased function with a high likelihood of achieving both

## 2015-03-22 NOTE — Discharge Instructions (Signed)
° °Dr. Frank Aluisio °Total Joint Specialist °[facility] °[address] °[place], [place] [address] °[phone] ° ° °Arthroscopic Procedure, Knee °An arthroscopic procedure can find what is wrong with your knee. °PROCEDURE °Arthroscopy is a surgical technique that allows your orthopedic surgeon to diagnose and treat your knee injury with accuracy. They will look into your knee through a small instrument. This is almost like a small (pencil sized) telescope. Because arthroscopy affects your knee less than open knee surgery, you can anticipate a more rapid recovery. Taking an active role by following your caregiver's instructions will help with rapid and complete recovery. Use crutches, rest, elevation, ice, and knee exercises as instructed. The length of recovery depends on various factors including type of injury, age, physical condition, medical conditions, and your rehabilitation. °Your knee is the joint between the large bones (femur and tibia) in your leg. Cartilage covers these bone ends which are smooth and slippery and allow your knee to bend and move smoothly. Two menisci, thick, semi-lunar shaped pads of cartilage which form a rim inside the joint, help absorb shock and stabilize your knee. Ligaments bind the bones together and support your knee joint. Muscles move the joint, help support your knee, and take stress off the joint itself. Because of this all programs and physical therapy to rehabilitate an injured or repaired knee require rebuilding and strengthening your muscles. °AFTER THE PROCEDURE °· After the procedure, you will be moved to a recovery area until most of the effects of the medication have worn off. Your caregiver will discuss the test results with you.  °· Only take over-the-counter or prescription medicines for pain, discomfort, or fever as directed by your caregiver.  °SEEK MEDICAL CARE IF:  °· You have increased bleeding from your wounds.  °· You see  redness, swelling, or have increasing pain in your wounds.  °· You have pus coming from your wound.  °· You have an oral temperature above 102° F (38.9° C).  °· You notice a bad smell coming from the wound or dressing.  °· You have severe pain with any motion of your knee.  °SEEK IMMEDIATE MEDICAL CARE IF:  °· You develop a rash.  °· You have difficulty breathing.  °· You have any allergic problems.  °FURTHER INSTRUCTIONS:  °· ICE to the affected knee every three hours for 30 minutes at a time and then as needed for pain and swelling.  Continue to use ice on the knee for pain and swelling from surgery. You may notice swelling that will progress down to the foot and ankle.  This is normal after surgery.  Elevate the leg when you are not up walking on it.   ° °DIET °You may resume your previous home diet once your are discharged from the hospital. ° °DRESSING / WOUND CARE / SHOWERING °You may start showering two days after being discharged home but do not submerge the incisions under water.  °Change dressing 48 hours after the procedure and then cover the small incisions with band aids until your follow up visit. °Change the surgical dressings daily and reapply a dry dressing each time.  ° °ACTIVITY °Walk with your walker as instructed. °Use walker as long as suggested by your caregivers. °Avoid periods of inactivity such as sitting longer than an hour when not asleep. This helps prevent blood clots.  °You may resume a sexual relationship in one month or when given the OK by your doctor.  °You may return to   work once you are cleared by your doctor.  °Do not drive a car for 6 weeks or until released by you surgeon.  °Do not drive while taking narcotics. ° °WEIGHT BEARING AS TOLERATED ° °POSTOPERATIVE CONSTIPATION PROTOCOL °Constipation - defined medically as fewer than three stools per week and severe constipation as less than one stool per week. ° °One of the most common issues patients have following surgery is  constipation.  Even if you have a regular bowel pattern at home, your normal regimen is likely to be disrupted due to multiple reasons following surgery.  Combination of anesthesia, postoperative narcotics, change in appetite and fluid intake all can affect your bowels.  In order to avoid complications following surgery, here are some recommendations in order to help you during your recovery period. ° °Colace (docusate) - Pick up an over-the-counter form of Colace or another stool softener and take twice a day as long as you are requiring postoperative pain medications.  Take with a full glass of water daily.  If you experience loose stools or diarrhea, hold the colace until you stool forms back up.  If your symptoms do not get better within 1 week or if they get worse, check with your doctor. ° °Dulcolax (bisacodyl) - Pick up over-the-counter and take as directed by the product packaging as needed to assist with the movement of your bowels.  Take with a full glass of water.  Use this product as needed if not relieved by Colace only.  ° °MiraLax (polyethylene glycol) - Pick up over-the-counter to have on hand.  MiraLax is a solution that will increase the amount of water in your bowels to assist with bowel movements.  Take as directed and can mix with a glass of water, juice, soda, coffee, or tea.  Take if you go more than two days without a movement. °Do not use MiraLax more than once per day. Call your doctor if you are still constipated or irregular after using this medication for 7 days in a row. ° °If you continue to have problems with postoperative constipation, please contact the office for further assistance and recommendations.  If you experience "the worst abdominal pain ever" or develop nausea or vomiting, please contact the office immediatly for further recommendations for treatment. ° °ITCHING ° If you experience itching with your medications, try taking only a single pain pill, or even half a pain pill  at a time.  You can also use Benadryl over the counter for itching or also to help with sleep.  ° °TED HOSE STOCKINGS °Wear the elastic stockings on both legs for three weeks following surgery during the day but you may remove then at night for sleeping. ° °MEDICATIONS °See your medication summary on the “After Visit Summary” that the nursing staff will review with you prior to discharge.  You may have some home medications which will be placed on hold until you complete the course of blood thinner medication.  It is important for you to complete the blood thinner medication as prescribed by your surgeon.  Continue your approved medications as instructed at time of discharge. °Do not drive while taking narcotics.  ° °PRECAUTIONS °If you experience chest pain or shortness of breath - call 911 immediately for transfer to the hospital emergency department.  °If you develop a fever greater that 101 F, purulent drainage from wound, increased redness or drainage from wound, foul odor from the wound/dressing, or calf pain - CONTACT YOUR SURGEON.   °                                                °  FOLLOW-UP APPOINTMENTS °Make sure you keep all of your appointments after your operation with your surgeon and caregivers. You should call the office at [phone]  and make an appointment for approximately one week after the date of your surgery or on the date instructed by your surgeon outlined in the "After Visit Summary". ° °RANGE OF MOTION AND STRENGTHENING EXERCISES  °Rehabilitation of the knee is important following a knee injury or an operation. After just a few days of immobilization, the muscles of the thigh which control the knee become weakened and shrink (atrophy). Knee exercises are designed to build up the tone and strength of the thigh muscles and to improve knee motion. Often times heat used for twenty to thirty minutes before working out will loosen up your tissues and help with improving the range of motion  but do not use heat for the first two weeks following surgery. These exercises can be done on a training (exercise) mat, on the floor, on a table or on a bed. Use what ever works the best and is most comfortable for you Knee exercises include: ° °QUAD STRENGTHENING EXERCISES °Strengthening Quadriceps Sets ° °Tighten muscles on top of thigh by pushing knees down into floor or table. °Hold for 20 seconds. Repeat 10 times. °Do 2 sessions per day. ° ° ° ° °Strengthening Terminal Knee Extension ° °With knee bent over bolster, straighten knee by tightening muscle on top of thigh. Be sure to keep bottom of knee on bolster. °Hold for 20 seconds. Repeat 10 times. °Do 2 sessions per day. ° ° °Straight Leg with Bent Knee ° °Lie on back with opposite leg bent. Keep involved knee slightly bent at knee and raise leg 4-6". Hold for 10 seconds. °Repeat 20 times per set. °Do 2 sets per session. °Do 2 sessions per day. ° ° °Post Anesthesia Home Care Instructions ° °Activity: °Get plenty of rest for the remainder of the day. A responsible adult should stay with you for 24 hours following the procedure.  °For the next 24 hours, DO NOT: °-Drive a car °-Operate machinery °-Drink alcoholic beverages °-Take any medication unless instructed by your physician °-Make any legal decisions or sign important papers. ° °Meals: °Start with liquid foods such as gelatin or soup. Progress to regular foods as tolerated. Avoid greasy, spicy, heavy foods. If nausea and/or vomiting occur, drink only clear liquids until the nausea and/or vomiting subsides. Call your physician if vomiting continues. ° °Special Instructions/Symptoms: °Your throat may feel dry or sore from the anesthesia or the breathing tube placed in your throat during surgery. If this causes discomfort, gargle with warm salt water. The discomfort should disappear within 24 hours. ° °If you had a scopolamine patch placed behind your ear for the management of post- operative nausea and/or  vomiting: ° °1. The medication in the patch is effective for 72 hours, after which it should be removed.  Wrap patch in a tissue and discard in the trash. Wash hands thoroughly with soap and water. °2. You may remove the patch earlier than 72 hours if you experience unpleasant side effects which may include dry mouth, dizziness or visual disturbances. °3. Avoid touching the patch. Wash your hands with soap and water after contact with the patch. °  ° °

## 2015-03-22 NOTE — Brief Op Note (Signed)
03/22/2015  10:56 AM  PATIENT:  Erica Alvarez  70 y.o. female  PRE-OPERATIVE DIAGNOSIS:  LEFT KNEE PATELLA CLUNK SYNDROME   POST-OPERATIVE DIAGNOSIS:  LEFT KNEE PATELLA CLUNK SYNDROME   PROCEDURE:  Procedure(s): LEFT KNEE ARTHROSCOPY AND (Left) SYNOVECTOMY (Left)  SURGEON:  Surgeon(s) and Role:    * Gaynelle Arabian, MD - Primary  PHYSICIAN ASSISTANT:   ASSISTANTS: none   ANESTHESIA:   general  EBL:     BLOOD ADMINISTERED:none  DRAINS: none   LOCAL MEDICATIONS USED:  MARCAINE     COUNTS:  YES  TOURNIQUET:    DICTATION: .Other Dictation: Dictation Number [id number]  PLAN OF CARE: Discharge to home after PACU  PATIENT DISPOSITION:  PACU - hemodynamically stable.

## 2015-03-22 NOTE — Transfer of Care (Signed)
  Last Vitals:  Filed Vitals:   03/22/15 0816  BP: 156/76  Pulse: 66  Temp: 37 C  Resp: 16   Immediate Anesthesia Transfer of Care Note  Patient: Erica Alvarez  Procedure(s) Performed: Procedure(s) (LRB): LEFT KNEE ARTHROSCOPY AND (Left) SYNOVECTOMY (Left)  Patient Location: PACU  Anesthesia Type: General  Level of Consciousness: awake, alert  and oriented  Airway & Oxygen Therapy: Patient Spontanous Breathing and Patient connected to nasal cannula oxygen  Post-op Assessment: Report given to PACU RN and Post -op Vital signs reviewed and stable  Post vital signs: Reviewed and stable  Complications: No apparent anesthesia complications

## 2015-03-22 NOTE — Anesthesia Preprocedure Evaluation (Addendum)
Anesthesia Evaluation  Patient identified by MRN, date of birth, ID band Patient awake    Reviewed: Allergy & Precautions, NPO status , Patient's Chart, lab work & pertinent test results  History of Anesthesia Complications (+) Family history of anesthesia reaction  Airway Mallampati: II  TM Distance: >3 FB Neck ROM: Full    Dental no notable dental hx.    Pulmonary former smoker,    Pulmonary exam normal breath sounds clear to auscultation       Cardiovascular Normal cardiovascular exam Rhythm:Regular Rate:Normal  S/P patent foramen ovale repair.   Neuro/Psych Anxiety CVA secondary to PFO CVA    GI/Hepatic Neg liver ROS, GERD  ,  Endo/Other  negative endocrine ROS  Renal/GU negative Renal ROS  negative genitourinary   Musculoskeletal  (+) Arthritis ,   Abdominal   Peds negative pediatric ROS (+)  Hematology  (+) anemia ,   Anesthesia Other Findings   Reproductive/Obstetrics negative OB ROS                            Anesthesia Physical Anesthesia Plan  ASA: III  Anesthesia Plan: General   Post-op Pain Management:    Induction: Intravenous  Airway Management Planned: LMA  Additional Equipment:   Intra-op Plan:   Post-operative Plan: Extubation in OR  Informed Consent: I have reviewed the patients History and Physical, chart, labs and discussed the procedure including the risks, benefits and alternatives for the proposed anesthesia with the patient or authorized representative who has indicated his/her understanding and acceptance.   Dental advisory given  Plan Discussed with: CRNA  Anesthesia Plan Comments:        Anesthesia Quick Evaluation

## 2015-03-22 NOTE — Anesthesia Postprocedure Evaluation (Signed)
Anesthesia Post Note  Patient: Erica Alvarez  Procedure(s) Performed: Procedure(s) (LRB): LEFT KNEE ARTHROSCOPY AND (Left) SYNOVECTOMY (Left)  Patient location during evaluation: PACU Anesthesia Type: General Level of consciousness: awake and alert Pain management: pain level controlled Vital Signs Assessment: post-procedure vital signs reviewed and stable Respiratory status: spontaneous breathing, nonlabored ventilation, respiratory function stable and patient connected to nasal cannula oxygen Cardiovascular status: blood pressure returned to baseline and stable Postop Assessment: no signs of nausea or vomiting Anesthetic complications: no    Last Vitals:  Filed Vitals:   03/22/15 1145 03/22/15 1155  BP: 143/74   Pulse: 64 65  Temp:    Resp: 20 11    Last Pain:  Filed Vitals:   03/22/15 1205  PainSc: 3                  Thelmer Legler J

## 2015-03-22 NOTE — Op Note (Signed)
NAMEABIR, MORRISETTE NO.:  [id number]  MEDICAL RECORD NO.:  [id number]  LOCATION:                                 FACILITY:  PHYSICIAN:  Gaynelle Arabian, M.D.         DATE OF BIRTH:  DATE OF PROCEDURE:  03/22/2015 DATE OF DISCHARGE:                              OPERATIVE REPORT   PREOPERATIVE DIAGNOSIS:  Left knee patellar clunk syndrome.  POSTOPERATIVE DIAGNOSIS:  Left knee patellar clunk syndrome.  PROCEDURE:  Left knee arthroscopy and synovectomy.  SURGEON:  Gaynelle Arabian, M.D.  ASSISTANT:  No assistant.  ANESTHESIA:  General.  ESTIMATED BLOOD LOSS:  Minimal.  DRAINS:  None.  COMPLICATIONS:  None.  CONDITION:  Stable to recovery.  BRIEF CLINICAL NOTE:  [name] is a 70 year old female with left total knee arthroplasty done 2013.  Recently developed painful popping consistent with patellar clunk syndrome.  She presents now for arthroscopy and synovectomy.  PROCEDURE IN DETAIL:  After successful administration of general anesthetic, a tourniquet placed on the left thigh.  Left lower extremity prepped and draped in usual sterile fashion.  Standard superomedial and inferolateral incisions were made.  Inflow cannula passed superomedial. Camera passed inferolateral.  Arthroscopic visualization proceeds. There was a large amount of hypertrophic synovial tissue between the patella and the quad tendon.  A superolateral portal was then created with an 11 blade and using combination of the shaver and ArthroCare device.  This tissue was debrided back to normal-appearing tissue and cauterized.  There was also some excess tissue in the lateral gutter, which was also debrided.  Infrapatellar area had some excess tissue, which was also debrided with the shaver and the ArthroCare.  The joints again inspected.  The suprapatellar pouch was recreated and abnormal tissue had been removed.  The patellar component appears stable as the femoral and tibial  components.  The joints again inspected.  No other abnormal tissue noted.  The arthroscopic equipments were removed from the lateral portals, which were closed with interrupted 4-0 nylon.  A 20 mL of 0.25% Marcaine with epinephrine was injected through the inflow cannula, which was then removed and that portal was closed with nylon.  Incisions were cleaned and dried and a bulky sterile dressing applied.  She was then awakened and transported to recovery in stable condition.     Gaynelle Arabian, M.D.     FA/MEDQ  D:  03/22/2015  T:  03/22/2015  Job:  [id number]

## 2015-03-22 NOTE — Interval H&P Note (Signed)
History and Physical Interval Note:  03/22/2015 10:09 AM  Erica Alvarez  has presented today for surgery, with the diagnosis of LEFT KNEE PATELLA CLUNK SYNDROME   The various methods of treatment have been discussed with the patient and family. After consideration of risks, benefits and other options for treatment, the patient has consented to  Procedure(s): LEFT KNEE ARTHROSCOPY AND (Left) SYNOVECTOMY (Left) as a surgical intervention .  The patient's history has been reviewed, patient examined, no change in status, stable for surgery.  I have reviewed the patient's chart and labs.  Questions were answered to the patient's satisfaction.     [name]

## 2015-03-23 ENCOUNTER — Encounter (HOSPITAL_BASED_OUTPATIENT_CLINIC_OR_DEPARTMENT_OTHER): Payer: Self-pay | Admitting: Orthopedic Surgery

## 2015-03-28 DIAGNOSIS — Z4789 Encounter for other orthopedic aftercare: Secondary | ICD-10-CM | POA: Diagnosis not present

## 2015-04-04 DIAGNOSIS — Z8673 Personal history of transient ischemic attack (TIA), and cerebral infarction without residual deficits: Secondary | ICD-10-CM | POA: Diagnosis not present

## 2015-04-04 DIAGNOSIS — Z90710 Acquired absence of both cervix and uterus: Secondary | ICD-10-CM | POA: Diagnosis not present

## 2015-04-04 DIAGNOSIS — N952 Postmenopausal atrophic vaginitis: Secondary | ICD-10-CM | POA: Diagnosis not present

## 2015-04-04 DIAGNOSIS — N7689 Other specified inflammation of vagina and vulva: Secondary | ICD-10-CM | POA: Diagnosis not present

## 2015-04-18 DIAGNOSIS — Z4789 Encounter for other orthopedic aftercare: Secondary | ICD-10-CM | POA: Diagnosis not present

## 2015-04-27 DIAGNOSIS — Z1231 Encounter for screening mammogram for malignant neoplasm of breast: Secondary | ICD-10-CM | POA: Diagnosis not present

## 2015-05-01 DIAGNOSIS — Z1231 Encounter for screening mammogram for malignant neoplasm of breast: Secondary | ICD-10-CM | POA: Diagnosis not present

## 2015-05-01 DIAGNOSIS — R928 Other abnormal and inconclusive findings on diagnostic imaging of breast: Secondary | ICD-10-CM | POA: Diagnosis not present

## 2015-05-01 DIAGNOSIS — R922 Inconclusive mammogram: Secondary | ICD-10-CM | POA: Diagnosis not present

## 2015-05-03 DIAGNOSIS — R921 Mammographic calcification found on diagnostic imaging of breast: Secondary | ICD-10-CM | POA: Diagnosis not present

## 2015-05-03 DIAGNOSIS — Z0000 Encounter for general adult medical examination without abnormal findings: Secondary | ICD-10-CM | POA: Diagnosis not present

## 2015-05-03 DIAGNOSIS — R922 Inconclusive mammogram: Secondary | ICD-10-CM | POA: Diagnosis not present

## 2015-05-03 DIAGNOSIS — Z1231 Encounter for screening mammogram for malignant neoplasm of breast: Secondary | ICD-10-CM | POA: Diagnosis not present

## 2015-05-03 DIAGNOSIS — D242 Benign neoplasm of left breast: Secondary | ICD-10-CM | POA: Diagnosis not present

## 2015-05-03 DIAGNOSIS — R928 Other abnormal and inconclusive findings on diagnostic imaging of breast: Secondary | ICD-10-CM | POA: Diagnosis not present

## 2015-05-04 DIAGNOSIS — D485 Neoplasm of uncertain behavior of skin: Secondary | ICD-10-CM | POA: Diagnosis not present

## 2015-05-04 DIAGNOSIS — L821 Other seborrheic keratosis: Secondary | ICD-10-CM | POA: Diagnosis not present

## 2015-05-05 DIAGNOSIS — L570 Actinic keratosis: Secondary | ICD-10-CM | POA: Diagnosis not present

## 2015-05-05 DIAGNOSIS — L821 Other seborrheic keratosis: Secondary | ICD-10-CM | POA: Diagnosis not present

## 2015-05-24 DIAGNOSIS — M65331 Trigger finger, right middle finger: Secondary | ICD-10-CM | POA: Diagnosis not present

## 2015-05-29 DIAGNOSIS — Z4789 Encounter for other orthopedic aftercare: Secondary | ICD-10-CM | POA: Diagnosis not present

## 2015-05-30 DIAGNOSIS — Z4789 Encounter for other orthopedic aftercare: Secondary | ICD-10-CM | POA: Diagnosis not present

## 2015-06-05 DIAGNOSIS — M65332 Trigger finger, left middle finger: Secondary | ICD-10-CM | POA: Diagnosis not present

## 2015-06-13 DIAGNOSIS — R0602 Shortness of breath: Secondary | ICD-10-CM | POA: Diagnosis not present

## 2015-06-13 DIAGNOSIS — R7301 Impaired fasting glucose: Secondary | ICD-10-CM | POA: Diagnosis not present

## 2015-06-13 DIAGNOSIS — R0609 Other forms of dyspnea: Secondary | ICD-10-CM | POA: Diagnosis not present

## 2015-06-13 DIAGNOSIS — E782 Mixed hyperlipidemia: Secondary | ICD-10-CM | POA: Diagnosis not present

## 2015-06-13 DIAGNOSIS — J309 Allergic rhinitis, unspecified: Secondary | ICD-10-CM | POA: Diagnosis not present

## 2015-06-13 DIAGNOSIS — H6120 Impacted cerumen, unspecified ear: Secondary | ICD-10-CM | POA: Diagnosis not present

## 2015-06-13 DIAGNOSIS — D72829 Elevated white blood cell count, unspecified: Secondary | ICD-10-CM | POA: Diagnosis not present

## 2015-06-13 DIAGNOSIS — E559 Vitamin D deficiency, unspecified: Secondary | ICD-10-CM | POA: Diagnosis not present

## 2015-06-19 DIAGNOSIS — Z4789 Encounter for other orthopedic aftercare: Secondary | ICD-10-CM | POA: Diagnosis not present

## 2015-06-20 DIAGNOSIS — M65332 Trigger finger, left middle finger: Secondary | ICD-10-CM | POA: Diagnosis not present

## 2015-07-04 ENCOUNTER — Ambulatory Visit: Payer: No Typology Code available for payment source | Admitting: Cardiology

## 2015-07-20 DIAGNOSIS — H524 Presbyopia: Secondary | ICD-10-CM | POA: Diagnosis not present

## 2015-07-20 DIAGNOSIS — H2513 Age-related nuclear cataract, bilateral: Secondary | ICD-10-CM | POA: Diagnosis not present

## 2015-07-20 DIAGNOSIS — H25013 Cortical age-related cataract, bilateral: Secondary | ICD-10-CM | POA: Diagnosis not present

## 2015-08-15 ENCOUNTER — Ambulatory Visit: Payer: No Typology Code available for payment source | Admitting: Cardiology

## 2015-08-16 ENCOUNTER — Telehealth: Payer: Self-pay | Admitting: Gastroenterology

## 2015-08-16 ENCOUNTER — Encounter: Payer: Self-pay | Admitting: Cardiology

## 2015-08-16 ENCOUNTER — Ambulatory Visit (INDEPENDENT_AMBULATORY_CARE_PROVIDER_SITE_OTHER): Payer: Medicare Other | Admitting: Cardiology

## 2015-08-16 VITALS — BP 146/88 | HR 61 | Ht 60.0 in | Wt 172.1 lb

## 2015-08-16 DIAGNOSIS — R0789 Other chest pain: Secondary | ICD-10-CM | POA: Diagnosis not present

## 2015-08-16 DIAGNOSIS — Z8249 Family history of ischemic heart disease and other diseases of the circulatory system: Secondary | ICD-10-CM

## 2015-08-16 DIAGNOSIS — E785 Hyperlipidemia, unspecified: Secondary | ICD-10-CM

## 2015-08-16 DIAGNOSIS — R0609 Other forms of dyspnea: Secondary | ICD-10-CM | POA: Diagnosis not present

## 2015-08-16 MED ORDER — GLYCOPYRROLATE 1 MG PO TABS: 1.0000 mg | ORAL_TABLET | Freq: Two times a day (BID) | ORAL | Status: DC

## 2015-08-16 NOTE — Patient Instructions (Signed)
Medication Instructions:  Your physician recommends that you continue on your current medications as directed. Please refer to the Current Medication list given to you today.   Labwork: None ordered  Testing/Procedures: Your physician has requested that you have an echocardiogram. Echocardiography is a painless test that uses sound waves to create images of your heart. It provides your doctor with information about the size and shape of your heart and how well your heart's chambers and valves are working. This procedure takes approximately one hour. There are no restrictions for this procedure.  Your physician has requested that you have en exercise stress myoview. For further information please visit [email or web address]. Please follow instruction sheet, as given.   Follow-Up: Your physician recommends that you schedule a follow-up appointment as needed   Any Other Special Instructions Will Be Listed Below (If Applicable).     If you need a refill on your cardiac medications before your next appointment, please call your pharmacy.

## 2015-08-16 NOTE — Progress Notes (Signed)
Cardiology Office Note    Date:  08/16/2015   ID:  Erica Alvarez, DOB 04/06/1945, MRN [id number]  PCP:  Gerrit Heck, MD  Cardiologist:   Candee Furbish, MD     History of Present Illness:  Erica Alvarez is a 70 y.o. female here for evaluation of dyspnea on exertion over the last 6 months. Previously has seen Dr. Verlon Setting approximately 10 years ago.  When walking up hills, has noted increasing shortness of breath. No real chest pain. Walking up hill at baseball game. Very winded. Stairs as well at home.  Sometimes she will also feel dizziness and she wonders if this is related to right jaw pain. Dizziness seems positional. She does not notice on treadmill.   Sister had stent placed, CAD. Father has atherosclerotics. Husband had PCI 95%.   She is from [place]    She has had a prior stroke and discovered PFO back when she saw Dr. Seward Grater and Dr. Gaynelle Arabian. Her PFO has been closed.  T-wave in V1 is no longer inverted on most recent EKG. She is a former smoker and quit in 1992. Her stroke was in 03/15/2005-PFO closed. She was a Scientist, clinical (histocompatibility and immunogenetics) at [employer]    Past Medical History  Diagnosis Date  . History of CVA (cerebrovascular accident) without residual deficits     [id number]  embolic right frontal MCA -- found to have PFO and Atrial Septum aneursym /  s/p  closure of PFO  . History of DVT of lower extremity     2003  . Benign essential tremor     takes inderal  . History of basal cell carcinoma excision     nose  . History of colon polyps   . Patellar clunk syndrome of left knee   . Family history of adverse reaction to anesthesia     "sister had episode hypotension"  . OA (osteoarthritis)     knees and hands  . S/P patent foramen ovale closure dx post cva w/ PFO with atrial septal aneursym    07-01-2005  via Intracardiac echo probe with deployment of CardioSEAL septal occluder  . Acute sinus infection     03-14-2014 current taking Z-Pak-- no fever but non-productive cough    . History of gastroesophageal reflux (GERD)     Past Surgical History  Procedure Laterality Date  . Patent foramen ovale closure  07-01-2005  dr Einar Gip    via Intracardiac echocardiogram successful placement CardioSEAL septal occluder (PFO measured about 57mm)  . Total knee arthroplasty  12/10/2011    Procedure: TOTAL KNEE ARTHROPLASTY;  Surgeon: [name], MD;  Location: [facility];  Service: Orthopedics;  Laterality: Left;  . Total hip arthroplasty Right 04/29/2012    Procedure: TOTAL HIP ARTHROPLASTY ANTERIOR APPROACH;  Surgeon: [name], MD;  Location: [facility];  Service: Orthopedics;  Laterality: Right;  . Abdominal hysterectomy  1982  . Appendectomy  1960's  . Total knee arthroplasty Right 08-08-2008  . Laparoscopic cholecystectomy  1999  . Knee arthroscopy Bilateral left 02-21-2010/  right 11-04-2007    bilateral 2001  . Tonsillectomy  1960's  . Knee arthroscopy Left 03/22/2015    Procedure: LEFT KNEE ARTHROSCOPY AND;  Surgeon: Gaynelle Arabian, MD;  Location: [facility];  Service: Orthopedics;  Laterality: Left;  . Synovectomy Left 03/22/2015    Procedure: SYNOVECTOMY;  Surgeon: Gaynelle Arabian, MD;  Location: [facility];  Service: Orthopedics;  Laterality: Left;    Current Medications: Outpatient Prescriptions Prior  to Visit  Medication Sig Dispense Refill  . acetaminophen (TYLENOL) 500 MG tablet Take 500 mg by mouth every 6 (six) hours as needed for moderate pain. For pain    . aspirin 81 MG tablet Take 81 mg by mouth daily.    . Cholecalciferol (VITAMIN D) 1000 UNITS capsule Take 3,000 Units by mouth every evening.     . fenofibrate (TRICOR) 48 MG tablet Take 48 mg by mouth every morning.     . fish oil-omega-3 fatty acids 1000 MG capsule Take 1 g by mouth daily.    . fluticasone (FLONASE) 50 MCG/ACT nasal spray Place 1 spray into both nostrils every morning.     Marland Kitchen glycopyrrolate (ROBINUL) 1 MG tablet Take 1 tablet (1 mg total) by mouth 2  (two) times daily. 60 tablet 0  . loperamide (IMODIUM A-D) 2 MG tablet Take 2 mg by mouth 4 (four) times daily as needed for diarrhea or loose stools.    . Multiple Vitamin (MULTIVITAMIN) tablet Take 1 tablet by mouth daily.    . polyvinyl alcohol (LIQUIFILM TEARS) 1.4 % ophthalmic solution Place 2 drops into both eyes daily as needed for dry eyes.    Marland Kitchen propranolol (INDERAL) 10 MG tablet Take 10 mg by mouth 2 (two) times daily.     Marland Kitchen HYDROcodone-acetaminophen (NORCO) 5-325 MG tablet Take 1-2 tablets by mouth every 4 (four) hours as needed for moderate pain. 30 tablet 0  . loratadine (CLARITIN) 10 MG tablet Take 10 mg by mouth every morning.     . methocarbamol (ROBAXIN) 500 MG tablet Take 1 tablet (500 mg total) by mouth 4 (four) times daily. As needed for muscle spasm 30 tablet 1  . oxyCODONE (ROXICODONE) 5 MG immediate release tablet Take 1-2 tablets (5-10 mg total) by mouth every 4 (four) hours as needed for severe pain. 30 tablet 0  . traMADol (ULTRAM) 50 MG tablet Take 1-2 tablets (50-100 mg total) by mouth every 6 (six) hours as needed. 30 tablet 1   No facility-administered medications prior to visit.     Allergies:   Medrol; Penicillins; Sulfa antibiotics; Tetracycline; and Hydrocodone   Social History   Social History  . Marital Status: Married    Spouse Name: N/A  . Number of Children: N/A  . Years of Education: N/A   Social History Main Topics  . Smoking status: Former Smoker -- 10 years    Quit date: 03/04/1980  . Smokeless tobacco: Never Used  . Alcohol Use: 0.0 oz/week    0 Standard drinks or equivalent per week     Comment: occasional   . Drug Use: No  . Sexual Activity: Not Asked   Other Topics Concern  . None   Social History Narrative     Family History:  The patient's family history includes Cancer - Colon in her father; Colon cancer (age of onset: 102) in her father; Dementia in her mother.   ROS:   Please see the history of present illness.    ROS All  other systems reviewed and are negative.   PHYSICAL EXAM:   VS:  BP 146/88 mmHg  Pulse 61  Ht 5' (1.524 m)  Wt 172 lb 1.9 oz (78.073 kg)  BMI 33.61 kg/m2   GEN: Well nourished, well developed, in no acute distress HEENT: normal Neck: no JVD, carotid bruits, or masses Cardiac: RRR; no murmurs, rubs, or gallops,no edema  Respiratory:  clear to auscultation bilaterally, normal work of breathing GI: soft, nontender, nondistended, +  BS MS: no deformity or atrophy Skin: warm and dry, no rash Neuro:  Alert and Oriented x 3, Strength and sensation are intact Psych: euthymic mood, full affect  Wt Readings from Last 3 Encounters:  08/16/15 172 lb 1.9 oz (78.073 kg)  03/22/15 172 lb (78.019 kg)  11/10/13 168 lb 4 oz (76.318 kg)      Studies/Labs Reviewed:   EKG:  EKG was ordered today 08/16/15-sinus rhythm, 61, no other abnormalities-EKG From 06/13/15 shows sinus rhythm with no other significant abnormalities. Heart rate was 58 bpm. Personally viewed.  Recent Labs: 08/17/2014: ALT 14; BUN 13; Creatinine, Ser 0.59; Potassium 4.2; Sodium 138 11/03/2014: Platelets 340 03/22/2015: Hemoglobin 14.0   Hemoglobin A1c 5.8, hemoglobin 13.4, BUN 12, creatinine 0.71, LFTs normal, LDL 105, trig was right to 57, HDL 45  Lipid Panel No results found for: CHOL, TRIG, HDL, CHOLHDL, VLDL, LDLCALC, LDLDIRECT  Additional studies/ records that were reviewed today include:  Office notes, lab or, EKGs reviewed    ASSESSMENT:    1. Dyspnea on exertion   2. Atypical chest pain   3. Hyperlipidemia   4. Family history of early CAD      PLAN:  In order of problems listed above:  Dyspnea on exertion/?angina  - This could be anginal equivalent. She is not having any chest discomfort however, she does state that she has barely noticeable chest discomfort at times.  - We will check nuclear stress test for further evaluation of possible ischemia  - I will also check an echocardiogram to ensure proper  structure and function of her heart and to exclude any significant valvular abnormalities.  - Other possibilities for shortness of breath include deconditioning, possible transit lung involvement given her remote history of smoking.  History of PFO closure  - Prior stroke 2007, Dr. Einar Gip closed PFO. No subsequent strokes.  Hyperlipidemia  - TriCor, triglycerides of been in the 250 range, fish oil.  Family history of CAD  - Sister.  Elevated blood pressure without hypertension  - She states that she takes propranolol for tremor  Medication Adjustments/Labs and Tests Ordered: Current medicines are reviewed at length with the patient today.  Concerns regarding medicines are outlined above.  Medication changes, Labs and Tests ordered today are listed in the Patient Instructions below. Patient Instructions  Medication Instructions:  Your physician recommends that you continue on your current medications as directed. Please refer to the Current Medication list given to you today.   Labwork: None ordered  Testing/Procedures: Your physician has requested that you have an echocardiogram. Echocardiography is a painless test that uses sound waves to create images of your heart. It provides your doctor with information about the size and shape of your heart and how well your heart's chambers and valves are working. This procedure takes approximately one hour. There are no restrictions for this procedure.  Your physician has requested that you have en exercise stress myoview. For further information please visit [email or web address]. Please follow instruction sheet, as given.   Follow-Up: Your physician recommends that you schedule a follow-up appointment as needed   Any Other Special Instructions Will Be Listed Below (If Applicable).     If you need a refill on your cardiac medications before your next appointment, please call your pharmacy.       Signed, Candee Furbish, MD  08/16/2015  10:55 AM    [facility] [address], [place], [place]  [address] Phone: [phone]; Fax: [phone]

## 2015-08-16 NOTE — Telephone Encounter (Signed)
Patient reports that she has had diarrhea for 6 weeks and taking imodium daily with little relief.  She has had glycopyrrolate 1 mg in the past for her IBS, I sent her in a refill .  She will come in and see Alonza Bogus, PA on 08/23/15 11:00

## 2015-08-18 ENCOUNTER — Telehealth: Payer: Self-pay | Admitting: Gastroenterology

## 2015-08-18 DIAGNOSIS — K5792 Diverticulitis of intestine, part unspecified, without perforation or abscess without bleeding: Secondary | ICD-10-CM | POA: Diagnosis not present

## 2015-08-18 DIAGNOSIS — R109 Unspecified abdominal pain: Secondary | ICD-10-CM | POA: Diagnosis not present

## 2015-08-18 DIAGNOSIS — R197 Diarrhea, unspecified: Secondary | ICD-10-CM | POA: Diagnosis not present

## 2015-08-18 NOTE — Telephone Encounter (Signed)
The pt was advised to take imodium for the diarrhea and continue the glycopyrrolate for the abd cramping as needed.  She will keep appt with extender on 08/23/15 and also has a f/u with PCP today

## 2015-08-18 NOTE — Telephone Encounter (Signed)
OK 

## 2015-08-22 ENCOUNTER — Encounter: Payer: Self-pay | Admitting: Cardiology

## 2015-08-23 ENCOUNTER — Emergency Department (HOSPITAL_COMMUNITY): Payer: Medicare Other

## 2015-08-23 ENCOUNTER — Ambulatory Visit: Payer: No Typology Code available for payment source | Admitting: Gastroenterology

## 2015-08-23 ENCOUNTER — Encounter (HOSPITAL_COMMUNITY): Payer: Self-pay | Admitting: Emergency Medicine

## 2015-08-23 ENCOUNTER — Emergency Department (HOSPITAL_COMMUNITY)
Admission: EM | Admit: 2015-08-23 | Discharge: 2015-08-24 | Disposition: A | Discharge status: Home / Self Care | Payer: Medicare Other | Attending: Emergency Medicine | Admitting: Emergency Medicine

## 2015-08-23 DIAGNOSIS — R109 Unspecified abdominal pain: Secondary | ICD-10-CM

## 2015-08-23 DIAGNOSIS — K5790 Diverticulosis of intestine, part unspecified, without perforation or abscess without bleeding: Secondary | ICD-10-CM | POA: Insufficient documentation

## 2015-08-23 DIAGNOSIS — Z87891 Personal history of nicotine dependence: Secondary | ICD-10-CM | POA: Insufficient documentation

## 2015-08-23 DIAGNOSIS — Z79899 Other long term (current) drug therapy: Secondary | ICD-10-CM | POA: Insufficient documentation

## 2015-08-23 DIAGNOSIS — R1032 Left lower quadrant pain: Secondary | ICD-10-CM | POA: Diagnosis not present

## 2015-08-23 DIAGNOSIS — Z7982 Long term (current) use of aspirin: Secondary | ICD-10-CM | POA: Diagnosis not present

## 2015-08-23 DIAGNOSIS — K5792 Diverticulitis of intestine, part unspecified, without perforation or abscess without bleeding: Secondary | ICD-10-CM | POA: Diagnosis not present

## 2015-08-23 DIAGNOSIS — R197 Diarrhea, unspecified: Secondary | ICD-10-CM | POA: Diagnosis not present

## 2015-08-23 HISTORY — DX: Diverticulitis of intestine, part unspecified, without perforation or abscess without bleeding: K57.92

## 2015-08-23 LAB — COMPREHENSIVE METABOLIC PANEL
ALT: 21 U/L (ref 14–54)
AST: 22 U/L (ref 15–41)
Albumin: 4.4 g/dL (ref 3.5–5.0)
Alkaline Phosphatase: 50 U/L (ref 38–126)
Anion gap: 9 (ref 5–15)
BUN: 10 mg/dL (ref 6–20)
CO2: 24 mmol/L (ref 22–32)
Calcium: 9.5 mg/dL (ref 8.9–10.3)
Chloride: 104 mmol/L (ref 101–111)
Creatinine, Ser: 0.79 mg/dL (ref 0.44–1.00)
GFR calc Af Amer: >60 mL/min (ref >60)
GFR calc non Af Amer: >60 mL/min (ref >60)
Glucose, Bld: 110 mg/dL — ABNORMAL HIGH (ref 65–99)
Potassium: 4 mmol/L (ref 3.5–5.1)
Sodium: 137 mmol/L (ref 135–145)
Total Bilirubin: 1 mg/dL (ref 0.3–1.2)
Total Protein: 7.4 g/dL (ref 6.5–8.1)

## 2015-08-23 LAB — URINALYSIS, ROUTINE W REFLEX MICROSCOPIC
BILIRUBIN URINE: NEGATIVE
GLUCOSE, UA: NEGATIVE mg/dL
KETONES UR: NEGATIVE mg/dL
Nitrite: NEGATIVE
PH: 5.5 (ref 5.0–8.0)
Protein, ur: NEGATIVE mg/dL
SPECIFIC GRAVITY, URINE: 1.009 (ref 1.005–1.030)

## 2015-08-23 LAB — URINE MICROSCOPIC-ADD ON

## 2015-08-23 LAB — CBC
HCT: 44.4 % (ref 36.0–46.0)
Hemoglobin: 14.7 g/dL (ref 12.0–15.0)
MCH: 28.2 pg (ref 26.0–34.0)
MCHC: 33.1 g/dL (ref 30.0–36.0)
MCV: 85.2 fL (ref 78.0–100.0)
Platelets: 340 10*3/uL (ref 150–400)
RBC: 5.21 MIL/uL — ABNORMAL HIGH (ref 3.87–5.11)
RDW: 13.8 % (ref 11.5–15.5)
WBC: 16.2 10*3/uL — ABNORMAL HIGH (ref 4.0–10.5)

## 2015-08-23 LAB — LIPASE, BLOOD: Lipase: 26 U/L (ref 11–51)

## 2015-08-23 MED ORDER — IOPAMIDOL (ISOVUE-300) INJECTION 61%
100.0000 mL | Freq: Once | INTRAVENOUS | Status: AC | PRN
  Administered 2015-08-24: 100 mL via INTRAVENOUS

## 2015-08-23 MED ORDER — SODIUM CHLORIDE 0.9 % IV BOLUS (SEPSIS)
1000.0000 mL | Freq: Once | INTRAVENOUS | Status: AC
  Administered 2015-08-23: 1000 mL via INTRAVENOUS

## 2015-08-23 MED ORDER — ONDANSETRON HCL 4 MG/2ML IJ SOLN
4.0000 mg | Freq: Once | INTRAMUSCULAR | Status: AC
  Administered 2015-08-23: 4 mg via INTRAVENOUS
  Filled 2015-08-23: qty 2

## 2015-08-23 NOTE — ED Notes (Signed)
Pt in ct 

## 2015-08-23 NOTE — ED Notes (Signed)
Pt sent here by pcp for evaluation of diverticulitis. Had WBC of 17 at PCP office. Tried po abx for treatment with no relief.

## 2015-08-23 NOTE — ED Provider Notes (Signed)
CSN: [id number]     Arrival date & time 08/23/15  1735 History   First MD Initiated Contact with Patient 08/23/15 2129     Chief Complaint  Patient presents with  . Diverticulitis     (Consider location/radiation/quality/duration/timing/severity/associated sxs/prior Treatment) HPI   70 year old female with abdominal pain. Symptom onset was last Wednesday. Pain is in the left abdomen. Has been persistent. Waxes and wanes somewhat. No appreciable exacerbating relieving factors. She saw her PCP on Friday who felt that she may have diverticulitis. She was started on ciprofloxacin and metronidazole. She's been taking these since Friday with no improvement of her symptoms. She has been having loose stools. 2-3 per day. No blood. Monday she developed a fever. She was reevaluated by her PCP felt that she should come to the emergency room for further evaluation. Mild nausea. No vomiting. No urinary complaints. Surgical history significant for appendectomy.   Past Medical History  Diagnosis Date  . History of CVA (cerebrovascular accident) without residual deficits     [id number]  embolic right frontal MCA -- found to have PFO and Atrial Septum aneursym /  s/p  closure of PFO  . History of DVT of lower extremity     2003  . Benign essential tremor     takes inderal  . History of basal cell carcinoma excision     nose  . History of colon polyps   . Patellar clunk syndrome of left knee   . Family history of adverse reaction to anesthesia     "sister had episode hypotension"  . OA (osteoarthritis)     knees and hands  . S/P patent foramen ovale closure dx post cva w/ PFO with atrial septal aneursym    07-01-2005  via Intracardiac echo probe with deployment of CardioSEAL septal occluder  . Acute sinus infection     03-14-2014 current taking Z-Pak-- no fever but non-productive cough  . History of gastroesophageal reflux (GERD)   . Diverticulitis    Past Surgical History  Procedure Laterality  Date  . Patent foramen ovale closure  07-01-2005  dr Einar Gip    via Intracardiac echocardiogram successful placement CardioSEAL septal occluder (PFO measured about 17mm)  . Total knee arthroplasty  12/10/2011    Procedure: TOTAL KNEE ARTHROPLASTY;  Surgeon: [name], MD;  Location: [facility];  Service: Orthopedics;  Laterality: Left;  . Total hip arthroplasty Right 04/29/2012    Procedure: TOTAL HIP ARTHROPLASTY ANTERIOR APPROACH;  Surgeon: [name], MD;  Location: [facility];  Service: Orthopedics;  Laterality: Right;  . Abdominal hysterectomy  1982  . Appendectomy  1960's  . Total knee arthroplasty Right 08-08-2008  . Laparoscopic cholecystectomy  1999  . Knee arthroscopy Bilateral left 02-21-2010/  right 11-04-2007    bilateral 2001  . Tonsillectomy  1960's  . Knee arthroscopy Left 03/22/2015    Procedure: LEFT KNEE ARTHROSCOPY AND;  Surgeon: Gaynelle Arabian, MD;  Location: [facility];  Service: Orthopedics;  Laterality: Left;  . Synovectomy Left 03/22/2015    Procedure: SYNOVECTOMY;  Surgeon: Gaynelle Arabian, MD;  Location: [facility];  Service: Orthopedics;  Laterality: Left;   Family History  Problem Relation Age of Onset  . Dementia Mother   . Cancer - Colon Father   . Colon cancer Father 51   Social History  Substance Use Topics  . Smoking status: Former Smoker -- 10 years    Quit date: 03/04/1980  . Smokeless tobacco: Never Used  . Alcohol  Use: 0.0 oz/week    0 Standard drinks or equivalent per week     Comment: occasional    OB History    No data available     Review of Systems All systems reviewed and negative, other than as noted in HPI.    Allergies  Medrol; Penicillins; Sulfa antibiotics; Tetracycline; and Hydrocodone  Home Medications   Prior to Admission medications   Medication Sig Start Date End Date Taking? Authorizing Provider  acetaminophen (TYLENOL) 500 MG tablet Take 500 mg by mouth every 6 (six) hours as needed for  moderate pain. For pain   Yes Historical Provider, MD  aspirin 81 MG tablet Take 81 mg by mouth daily.   Yes Historical Provider, MD  cetirizine (ZYRTEC) 10 MG tablet Take 10 mg by mouth daily.   Yes Historical Provider, MD  ciprofloxacin (CIPRO) 500 MG tablet Take 500 mg by mouth 2 (two) times daily.   Yes Historical Provider, MD  fenofibrate (TRICOR) 48 MG tablet Take 48 mg by mouth every morning.    Yes Historical Provider, MD  fish oil-omega-3 fatty acids 1000 MG capsule Take 1 g by mouth daily.   Yes Historical Provider, MD  fluticasone (FLONASE) 50 MCG/ACT nasal spray Place 2 sprays into both nostrils every morning.  08/04/12  Yes Historical Provider, MD  metroNIDAZOLE (FLAGYL) 500 MG tablet Take 500 mg by mouth 3 (three) times daily.   Yes Historical Provider, MD  Multiple Vitamin (MULTIVITAMIN) tablet Take 1 tablet by mouth daily.   Yes Historical Provider, MD  propranolol (INDERAL) 10 MG tablet Take 10 mg by mouth 2 (two) times daily.    Yes Historical Provider, MD  glycopyrrolate (ROBINUL) 1 MG tablet Take 1 tablet (1 mg total) by mouth 2 (two) times daily. Patient not taking: Reported on 08/23/2015 08/16/15   Ladene Artist, MD   BP 143/88 mmHg  Pulse 81  Temp(Src) 98.1 F (36.7 C) (Oral)  Resp 16  Ht 5' (1.524 m)  Wt 165 lb (74.844 kg)  BMI 32.22 kg/m2  SpO2 96% Physical Exam  Constitutional: She appears well-developed and well-nourished. No distress.  HENT:  Head: Normocephalic and atraumatic.  Eyes: Conjunctivae are normal. Right eye exhibits no discharge. Left eye exhibits no discharge.  Neck: Neck supple.  Cardiovascular: Normal rate, regular rhythm and normal heart sounds.  Exam reveals no gallop and no friction rub.   No murmur heard. Pulmonary/Chest: Effort normal and breath sounds normal. No respiratory distress.  Abdominal: Soft. She exhibits no distension. There is tenderness.  Left lower quadrant tenderness without rebound or guarding. No distention.   Musculoskeletal: She exhibits no edema or tenderness.  Neurological: She is alert.  Skin: Skin is warm and dry.  Psychiatric: She has a normal mood and affect. Her behavior is normal. Thought content normal.  Nursing note and vitals reviewed.   ED Course  Procedures (including critical care time) Labs Review Labs Reviewed  COMPREHENSIVE METABOLIC PANEL - Abnormal; Notable for the following:    Glucose, Bld 110 (*)    All other components within normal limits  CBC - Abnormal; Notable for the following:    WBC 16.2 (*)    RBC 5.21 (*)    All other components within normal limits  URINE CULTURE  LIPASE, BLOOD  URINALYSIS, ROUTINE W REFLEX MICROSCOPIC (NOT AT [facility])    Imaging Review No results found. I have personally reviewed and evaluated these images and lab results as part of my medical decision-making.  EKG Interpretation None      MDM   Final diagnoses:  Abdominal pain, unspecified abdominal location  Diarrhea, unspecified type    70 year old female with persistent left-sided abdominal pain. Clinical concern for diverticulitis but symptoms have not improved despite appropriate treatment with ciprofloxacin and metronidazole. She has a persistent leukocytosis. Will CT to further evaluate. If CT does show diverticulitis, I feel it would be prudent to admit her for IV antibiotics because I would consider this failure of outpatient treatment at this point. Care signed out at change fo shift with imaging pending.     Virgel Manifold, MD 08/29/15 [phone]

## 2015-08-24 DIAGNOSIS — R109 Unspecified abdominal pain: Secondary | ICD-10-CM | POA: Diagnosis not present

## 2015-08-24 DIAGNOSIS — R197 Diarrhea, unspecified: Secondary | ICD-10-CM | POA: Diagnosis not present

## 2015-08-24 LAB — GASTROINTESTINAL PANEL BY PCR, STOOL (REPLACES STOOL CULTURE)
ASTROVIRUS: NOT DETECTED
Adenovirus F40/41: NOT DETECTED
CAMPYLOBACTER SPECIES: NOT DETECTED
Cryptosporidium: NOT DETECTED
Cyclospora cayetanensis: NOT DETECTED
E. COLI O157: NOT DETECTED
ENTAMOEBA HISTOLYTICA: NOT DETECTED
ENTEROTOXIGENIC E COLI (ETEC): NOT DETECTED
Enteroaggregative E coli (EAEC): NOT DETECTED
Enteropathogenic E coli (EPEC): NOT DETECTED
Giardia lamblia: NOT DETECTED
NOROVIRUS GI/GII: NOT DETECTED
PLESIMONAS SHIGELLOIDES: NOT DETECTED
ROTAVIRUS A: NOT DETECTED
SALMONELLA SPECIES: NOT DETECTED
SAPOVIRUS (I, II, IV, AND V): NOT DETECTED
SHIGA LIKE TOXIN PRODUCING E COLI (STEC): NOT DETECTED
SHIGELLA/ENTEROINVASIVE E COLI (EIEC): NOT DETECTED
Vibrio cholerae: NOT DETECTED
Vibrio species: NOT DETECTED
Yersinia enterocolitica: NOT DETECTED

## 2015-08-24 LAB — C DIFFICILE QUICK SCREEN W PCR REFLEX
C DIFFICILE (CDIFF) INTERP: NEGATIVE
C DIFFICILE (CDIFF) TOXIN: NEGATIVE
C DIFFICLE (CDIFF) ANTIGEN: NEGATIVE

## 2015-08-24 NOTE — ED Provider Notes (Signed)
  Physical Exam  BP 137/91 mmHg  Pulse 82  Temp(Src) 98 F (36.7 C) (Oral)  Resp 18  Ht 5' (1.524 m)  Wt 165 lb (74.844 kg)  BMI 32.22 kg/m2  SpO2 96%  Physical Exam  ED Course  Procedures  MDM  Pt came in with LLQ pain, loose BM and elevated WC. Already on antibiotics for diverticulitis. Pt's CT is pending, d/c if neg. Admit is + for diverticulitis.  3:13 AM  Pt's CT is neg.  Stool sample sent for screening for cdiff. Abd exam is non peritoneal. No other source of infection on hx and exam. Results discussed. Strict ER return precautions have been discussed, and patient is agreeing with the plan and is comfortable with the workup done and the recommendations from the ER.       Varney Biles, MD 08/24/15 [phone]

## 2015-08-24 NOTE — Discharge Instructions (Signed)

## 2015-08-25 ENCOUNTER — Ambulatory Visit (INDEPENDENT_AMBULATORY_CARE_PROVIDER_SITE_OTHER): Payer: Medicare Other | Admitting: Gastroenterology

## 2015-08-25 ENCOUNTER — Encounter: Payer: Self-pay | Admitting: Gastroenterology

## 2015-08-25 VITALS — BP 110/78 | HR 72 | Ht 60.25 in | Wt 169.4 lb

## 2015-08-25 DIAGNOSIS — R197 Diarrhea, unspecified: Secondary | ICD-10-CM

## 2015-08-25 DIAGNOSIS — K589 Irritable bowel syndrome without diarrhea: Secondary | ICD-10-CM

## 2015-08-25 LAB — URINE CULTURE: Culture: 20000 — AB

## 2015-08-25 MED ORDER — GLYCOPYRROLATE 1 MG PO TABS: 1.0000 mg | ORAL_TABLET | Freq: Two times a day (BID) | ORAL | Status: DC | PRN

## 2015-08-25 MED ORDER — RIFAXIMIN 550 MG PO TABS: 550.0000 mg | ORAL_TABLET | Freq: Three times a day (TID) | ORAL | Status: DC

## 2015-08-25 NOTE — Progress Notes (Signed)
    History of Present Illness: This is a 70 year old female with abdominal pain and diarrhea. She has a history of IBS D. She took a course of cephalexin in March and several weeks after that her diarrhea worsened. She has between 3 and 8 bowel movements per day and has been associated with left lower quadrant pain as well. She was evaluated by her PCP and treated with a course of Cipro and Flagyl for possible diverticulitis. She had significant side effects from metronidazole in her diarrhea worsened. She was seen in the ED 2 days ago blood work revealed an elevated white blood cell count at 16.2 otherwise blood work was unremarkable. GI pathogen panel and C. difficile quick scan were both negative. CT scan results listed below.  Abd/pelvic CT IMPRESSION: No acute abnormality seen within the abdomen or pelvis. No evidence of diverticulosis.  Current Medications, Allergies, Past Medical History, Past Surgical History, Family History and Social History were reviewed in [employer] record.  Physical Exam: General: Well developed, well nourished, no acute distress Head: Normocephalic and atraumatic Eyes:  sclerae anicteric, EOMI Ears: Normal auditory acuity Mouth: No deformity or lesions Lungs: Clear throughout to auscultation Heart: Regular rate and rhythm; no murmurs, rubs or bruits Abdomen: Soft, mild lower abdominal tenderness to deep palpation and non distended. No masses, hepatosplenomegaly or hernias noted. Normal Bowel sounds Musculoskeletal: Symmetrical with no gross deformities  Pulses:  Normal pulses noted Extremities: No clubbing, cyanosis, edema or deformities noted Neurological: Alert oriented x 4, grossly nonfocal Psychological:  Alert and cooperative. Normal mood and affect  Assessment and Recommendations:  1. IBS-D. Xifaxan 550 mg 3 times a day for 2 weeks. Glycopyrrolate 1 mg twice a day when necessary. Imodium 1 twice a day when necessary. She is  advised to call if her symptoms are not steadily improving on this regimen. Consider Librax instead of glycopyrrolate if symptoms are not improve as Librax has been effective in the past. Return office visit 4 weeks.

## 2015-08-25 NOTE — Patient Instructions (Signed)
We have sent your demographic information and a prescription for Xifaxan to [facility]. This pharmacy is able to get medication approved through insurance and get you the lowest copay possible. If you have not heard from them within 1 week, please call our office at [phone] to let us know.  We have sent the following medications to your pharmacy for you to pick up at your convenience:robinul.  Take your Imodium twice daily as needed.   Normal BMI (Body Mass Index- based on height and weight) is between 23 and 30. Your BMI today is Body mass index is 32.82 kg/(m^2). Marland Kitchen Please consider follow up  regarding your BMI with your Primary Care Provider.  Thank you for choosing me and [facility].  Pricilla Riffle. Dagoberto Ligas., MD., Marval Regal

## 2015-08-26 ENCOUNTER — Telehealth (HOSPITAL_BASED_OUTPATIENT_CLINIC_OR_DEPARTMENT_OTHER): Payer: Self-pay

## 2015-08-26 NOTE — Telephone Encounter (Signed)
Post ED Visit - Positive Culture Follow-up  Culture report reviewed by antimicrobial stewardship pharmacist:  []  Elenor Quinones, Pharm.D. []  Heide Guile, Pharm.D., BCPS []  Parks Neptune, Pharm.D. []  Alycia Rossetti, Pharm.D., BCPS []  Oblong, Pharm.D., BCPS, AAHIVP []  Legrand Como, Pharm.D., BCPS, AAHIVP []  Milus Glazier, Pharm.D. []  Rob Evette Doffing, Pharm.D. Ebony Hail Masters Pharm D Positive urine culture Treated with Ciprofloxacin, organism sensitive to the same and no further patient follow-up is required at this time.  Genia Del 08/26/2015, 11:11 AM

## 2015-08-29 ENCOUNTER — Telehealth: Payer: Self-pay | Admitting: Gastroenterology

## 2015-08-29 MED ORDER — CIPROFLOXACIN HCL 250 MG PO TABS: 250.0000 mg | ORAL_TABLET | Freq: Two times a day (BID) | ORAL | Status: DC

## 2015-08-29 NOTE — Telephone Encounter (Signed)
Informed patient that we are sending in Cipro to her pharmacy to take for 2 weeks and to do a trial of different pro-biotics that I previously mentioned. Patient verbalized understanding.

## 2015-08-29 NOTE — Telephone Encounter (Signed)
Cipro 250 mg po bid for 2 weeks Trial of the 3 probiotics listed

## 2015-08-29 NOTE — Telephone Encounter (Signed)
Called [facility] and they state Xifaxan was approved but it still over 500 dollars for her co-pay. Patient notified and states she cannot afford. Patient states she wanted to mention that she cannot tolerate Flagyl. She states she believes it causes her more diarrhea and is nauseated while taking. Patient states she believed that Dr. Fuller Plan suggested she have a list of other pro-biotics she can try. Gave her examples of Align, Restora, and Florastor.  Please advise Dr. Fuller Plan if you want something else sent in.

## 2015-09-04 ENCOUNTER — Telehealth (HOSPITAL_COMMUNITY): Payer: Self-pay | Admitting: *Deleted

## 2015-09-04 NOTE — Telephone Encounter (Signed)
Left message on voicemail per DPR in reference to upcoming appointment scheduled on 09/07/15 at 0945 with detailed instructions given per Myocardial Perfusion Study Information Sheet for the test. LM to arrive 15 minutes early, and that it is imperative to arrive on time for appointment to keep from having the test rescheduled. If you need to cancel or reschedule your appointment, please call the office within 24 hours of your appointment. Failure to do so may result in a cancellation of your appointment, and a $50 no show fee. Phone number given for call back for any questions. Latifa Noble, Ranae Palms

## 2015-09-07 ENCOUNTER — Other Ambulatory Visit: Payer: Self-pay

## 2015-09-07 ENCOUNTER — Ambulatory Visit (HOSPITAL_BASED_OUTPATIENT_CLINIC_OR_DEPARTMENT_OTHER): Payer: Medicare Other

## 2015-09-07 ENCOUNTER — Telehealth: Payer: Self-pay | Admitting: Cardiology

## 2015-09-07 ENCOUNTER — Ambulatory Visit (HOSPITAL_COMMUNITY): Payer: Medicare Other | Attending: Internal Medicine

## 2015-09-07 DIAGNOSIS — R42 Dizziness and giddiness: Secondary | ICD-10-CM | POA: Diagnosis not present

## 2015-09-07 DIAGNOSIS — I071 Rheumatic tricuspid insufficiency: Secondary | ICD-10-CM | POA: Insufficient documentation

## 2015-09-07 DIAGNOSIS — R0609 Other forms of dyspnea: Secondary | ICD-10-CM | POA: Insufficient documentation

## 2015-09-07 DIAGNOSIS — Z8249 Family history of ischemic heart disease and other diseases of the circulatory system: Secondary | ICD-10-CM | POA: Diagnosis not present

## 2015-09-07 DIAGNOSIS — I340 Nonrheumatic mitral (valve) insufficiency: Secondary | ICD-10-CM | POA: Insufficient documentation

## 2015-09-07 DIAGNOSIS — R0789 Other chest pain: Secondary | ICD-10-CM | POA: Diagnosis not present

## 2015-09-07 DIAGNOSIS — I517 Cardiomegaly: Secondary | ICD-10-CM | POA: Diagnosis not present

## 2015-09-07 LAB — ECHOCARDIOGRAM COMPLETE
CHL CUP DOP CALC LVOT VTI: 23.8 cm
EERAT: 7.63
EWDT: 187 ms
FS: 40 % (ref 28–44)
IV/PV OW: 0.85
LA diam end sys: 29 mm
LA diam index: 1.67 cm/m2
LA vol A4C: 42 ml
LA vol: 47 mL
LASIZE: 29 mm
LAVOLIN: 27 mL/m2
LV E/e' medial: 7.63
LV E/e'average: 7.63
LV TDI E'LATERAL: 10.6
LVELAT: 10.6 cm/s
LVOT area: 3.46 cm2
LVOT diameter: 21 mm
LVOT peak vel: 102 cm/s
LVOTSV: 82 mL
MV Dec: 187
MV Peak grad: 3 mmHg
MVPKAVEL: 67.1 m/s
MVPKEVEL: 80.9 m/s
PW: 9.56 mm — AB (ref 0.6–1.1)
Reg peak vel: 213 cm/s
TDI e' medial: 8.44
TR max vel: 213 cm/s

## 2015-09-07 LAB — MYOCARDIAL PERFUSION IMAGING
LV dias vol: 91 mL (ref 46–106)
LV sys vol: 38 mL
Peak HR: 97 {beats}/min
RATE: 0.29
Rest HR: 58 {beats}/min
SDS: 1
SRS: 1
SSS: 2
TID: 0.98

## 2015-09-07 MED ORDER — TECHNETIUM TC 99M TETROFOSMIN IV KIT
11.0000 | PACK | Freq: Once | INTRAVENOUS | Status: AC | PRN
  Administered 2015-09-07: 11 via INTRAVENOUS
  Filled 2015-09-07: qty 11

## 2015-09-07 MED ORDER — TECHNETIUM TC 99M TETROFOSMIN IV KIT
32.4000 | PACK | Freq: Once | INTRAVENOUS | Status: AC | PRN
  Administered 2015-09-07: 32 via INTRAVENOUS
  Filled 2015-09-07: qty 32

## 2015-09-07 MED ORDER — REGADENOSON 0.4 MG/5ML IV SOLN
0.4000 mg | Freq: Once | INTRAVENOUS | Status: AC
  Administered 2015-09-07: 0.4 mg via INTRAVENOUS

## 2015-09-07 NOTE — Telephone Encounter (Signed)
F/u message ° °Pt returning RN call. Please call back to discuss  °

## 2015-09-07 NOTE — Telephone Encounter (Signed)
Reviewed results of nuc stress test with patient who states understanding.  All questions were answered.

## 2015-09-08 ENCOUNTER — Telehealth: Payer: Self-pay | Admitting: Cardiology

## 2015-09-08 NOTE — Telephone Encounter (Signed)
F/u Message  Pt returning RN call. pleae call back to discuss

## 2015-09-08 NOTE — Telephone Encounter (Signed)
Reviewed results of echo with pt.  She was very happy and relieved.  She will f/u as needed.

## 2015-09-27 DIAGNOSIS — L03031 Cellulitis of right toe: Secondary | ICD-10-CM | POA: Diagnosis not present

## 2015-09-28 ENCOUNTER — Encounter: Payer: Self-pay | Admitting: Gastroenterology

## 2015-09-28 ENCOUNTER — Ambulatory Visit (INDEPENDENT_AMBULATORY_CARE_PROVIDER_SITE_OTHER): Payer: Medicare Other | Admitting: Gastroenterology

## 2015-09-28 ENCOUNTER — Ambulatory Visit: Payer: Medicare Other | Admitting: Gastroenterology

## 2015-09-28 VITALS — BP 120/72 | HR 68 | Ht 60.0 in | Wt 170.4 lb

## 2015-09-28 DIAGNOSIS — K589 Irritable bowel syndrome without diarrhea: Secondary | ICD-10-CM | POA: Diagnosis not present

## 2015-09-28 NOTE — Patient Instructions (Signed)
Thank you for choosing me and [facility].  Malcolm T. Stark, Jr., MD., FACG  

## 2015-09-28 NOTE — Progress Notes (Signed)
    History of Present Illness: This is a 70 year old female turning for follow-up of IBS-D. She was advised to take a 2 week course of Xifaxan however due to cost concerns she declined. She has found that a low FODMAP diet and low gluten diet have substantially reduced her symptoms. She has been taking a daily probiotic along with glycopyrrolate twice daily and Imodium twice daily as needed.  Current Medications, Allergies, Past Medical History, Past Surgical History, Family History and Social History were reviewed in [employer] record.  Physical Exam: General: Well developed, well nourished, no acute distress Head: Normocephalic and atraumatic Eyes:  sclerae anicteric, EOMI Ears: Normal auditory acuity Mouth: No deformity or lesions Lungs: Clear throughout to auscultation Heart: Regular rate and rhythm; no murmurs, rubs or bruits Abdomen: Soft, non tender and non distended. No masses, hepatosplenomegaly or hernias noted. Normal Bowel sounds Musculoskeletal: Symmetrical with no gross deformities  Pulses:  Normal pulses noted Extremities: No clubbing, cyanosis, edema or deformities noted Neurological: Alert oriented x 4, grossly nonfocal Psychological:  Alert and cooperative. Normal mood and affect  Assessment and Recommendations:  1. IBS D. Continue low FODMAP and low gluten diet. Avoid other foods that trigger symptoms. Continue daily probiotic, glycopyrrolate 1 mg twice a day when necessary and Imodium 1 twice a day when necessary.  I spent 15 minutes of face-to-face time with the patient. Greater than 50% of the time was spent counseling and coordinating care.

## 2015-11-16 DIAGNOSIS — D72829 Elevated white blood cell count, unspecified: Secondary | ICD-10-CM | POA: Diagnosis not present

## 2015-11-16 DIAGNOSIS — R7301 Impaired fasting glucose: Secondary | ICD-10-CM | POA: Diagnosis not present

## 2015-11-16 DIAGNOSIS — E559 Vitamin D deficiency, unspecified: Secondary | ICD-10-CM | POA: Diagnosis not present

## 2015-11-16 DIAGNOSIS — E782 Mixed hyperlipidemia: Secondary | ICD-10-CM | POA: Diagnosis not present

## 2015-11-23 DIAGNOSIS — M8588 Other specified disorders of bone density and structure, other site: Secondary | ICD-10-CM | POA: Diagnosis not present

## 2015-11-23 DIAGNOSIS — Z8673 Personal history of transient ischemic attack (TIA), and cerebral infarction without residual deficits: Secondary | ICD-10-CM | POA: Diagnosis not present

## 2015-11-23 DIAGNOSIS — R7301 Impaired fasting glucose: Secondary | ICD-10-CM | POA: Diagnosis not present

## 2015-11-23 DIAGNOSIS — E559 Vitamin D deficiency, unspecified: Secondary | ICD-10-CM | POA: Diagnosis not present

## 2015-11-23 DIAGNOSIS — E782 Mixed hyperlipidemia: Secondary | ICD-10-CM | POA: Diagnosis not present

## 2015-11-23 DIAGNOSIS — Z1389 Encounter for screening for other disorder: Secondary | ICD-10-CM | POA: Diagnosis not present

## 2015-11-23 DIAGNOSIS — F419 Anxiety disorder, unspecified: Secondary | ICD-10-CM | POA: Diagnosis not present

## 2015-11-23 DIAGNOSIS — Z23 Encounter for immunization: Secondary | ICD-10-CM | POA: Diagnosis not present

## 2015-11-23 DIAGNOSIS — Z0000 Encounter for general adult medical examination without abnormal findings: Secondary | ICD-10-CM | POA: Diagnosis not present

## 2015-11-23 DIAGNOSIS — D72829 Elevated white blood cell count, unspecified: Secondary | ICD-10-CM | POA: Diagnosis not present

## 2016-01-19 DIAGNOSIS — M7062 Trochanteric bursitis, left hip: Secondary | ICD-10-CM | POA: Diagnosis not present

## 2016-01-19 DIAGNOSIS — Z96653 Presence of artificial knee joint, bilateral: Secondary | ICD-10-CM | POA: Diagnosis not present

## 2016-01-19 DIAGNOSIS — Z96652 Presence of left artificial knee joint: Secondary | ICD-10-CM | POA: Diagnosis not present

## 2016-01-19 DIAGNOSIS — Z471 Aftercare following joint replacement surgery: Secondary | ICD-10-CM | POA: Diagnosis not present

## 2016-01-30 DIAGNOSIS — L820 Inflamed seborrheic keratosis: Secondary | ICD-10-CM | POA: Diagnosis not present

## 2016-01-30 DIAGNOSIS — L918 Other hypertrophic disorders of the skin: Secondary | ICD-10-CM | POA: Diagnosis not present

## 2016-01-30 DIAGNOSIS — D1800 Hemangioma unspecified site: Secondary | ICD-10-CM | POA: Diagnosis not present

## 2016-01-30 DIAGNOSIS — Z23 Encounter for immunization: Secondary | ICD-10-CM | POA: Diagnosis not present

## 2016-01-30 DIAGNOSIS — L814 Other melanin hyperpigmentation: Secondary | ICD-10-CM | POA: Diagnosis not present

## 2016-01-30 DIAGNOSIS — D485 Neoplasm of uncertain behavior of skin: Secondary | ICD-10-CM | POA: Diagnosis not present

## 2016-01-30 DIAGNOSIS — L720 Epidermal cyst: Secondary | ICD-10-CM | POA: Diagnosis not present

## 2016-01-30 DIAGNOSIS — L821 Other seborrheic keratosis: Secondary | ICD-10-CM | POA: Diagnosis not present

## 2016-02-19 DIAGNOSIS — M5416 Radiculopathy, lumbar region: Secondary | ICD-10-CM | POA: Diagnosis not present

## 2016-02-19 DIAGNOSIS — M7061 Trochanteric bursitis, right hip: Secondary | ICD-10-CM | POA: Diagnosis not present

## 2016-02-19 DIAGNOSIS — M5136 Other intervertebral disc degeneration, lumbar region: Secondary | ICD-10-CM | POA: Diagnosis not present

## 2016-03-04 HISTORY — PX: EYE SURGERY: SHX253

## 2016-03-26 DIAGNOSIS — M5136 Other intervertebral disc degeneration, lumbar region: Secondary | ICD-10-CM | POA: Diagnosis not present

## 2016-04-03 DIAGNOSIS — N7689 Other specified inflammation of vagina and vulva: Secondary | ICD-10-CM | POA: Diagnosis not present

## 2016-04-03 DIAGNOSIS — R3915 Urgency of urination: Secondary | ICD-10-CM | POA: Diagnosis not present

## 2016-04-03 DIAGNOSIS — Z01411 Encounter for gynecological examination (general) (routine) with abnormal findings: Secondary | ICD-10-CM | POA: Diagnosis not present

## 2016-04-26 DIAGNOSIS — M7062 Trochanteric bursitis, left hip: Secondary | ICD-10-CM | POA: Diagnosis not present

## 2016-04-26 DIAGNOSIS — M7061 Trochanteric bursitis, right hip: Secondary | ICD-10-CM | POA: Diagnosis not present

## 2016-05-02 DIAGNOSIS — M8589 Other specified disorders of bone density and structure, multiple sites: Secondary | ICD-10-CM | POA: Diagnosis not present

## 2016-05-02 DIAGNOSIS — D249 Benign neoplasm of unspecified breast: Secondary | ICD-10-CM | POA: Diagnosis not present

## 2016-05-04 DIAGNOSIS — I10 Essential (primary) hypertension: Secondary | ICD-10-CM | POA: Diagnosis not present

## 2016-05-04 DIAGNOSIS — H6123 Impacted cerumen, bilateral: Secondary | ICD-10-CM | POA: Diagnosis not present

## 2016-05-04 DIAGNOSIS — R42 Dizziness and giddiness: Secondary | ICD-10-CM | POA: Diagnosis not present

## 2016-05-06 DIAGNOSIS — R42 Dizziness and giddiness: Secondary | ICD-10-CM | POA: Diagnosis not present

## 2016-05-06 DIAGNOSIS — R030 Elevated blood-pressure reading, without diagnosis of hypertension: Secondary | ICD-10-CM | POA: Diagnosis not present

## 2016-05-06 DIAGNOSIS — Z9109 Other allergy status, other than to drugs and biological substances: Secondary | ICD-10-CM | POA: Diagnosis not present

## 2016-05-20 DIAGNOSIS — R05 Cough: Secondary | ICD-10-CM | POA: Diagnosis not present

## 2016-05-28 DIAGNOSIS — M85852 Other specified disorders of bone density and structure, left thigh: Secondary | ICD-10-CM | POA: Diagnosis not present

## 2016-05-29 DIAGNOSIS — Z87891 Personal history of nicotine dependence: Secondary | ICD-10-CM | POA: Diagnosis not present

## 2016-05-29 DIAGNOSIS — H93292 Other abnormal auditory perceptions, left ear: Secondary | ICD-10-CM | POA: Diagnosis not present

## 2016-05-29 DIAGNOSIS — H6123 Impacted cerumen, bilateral: Secondary | ICD-10-CM | POA: Insufficient documentation

## 2016-05-29 DIAGNOSIS — H6122 Impacted cerumen, left ear: Secondary | ICD-10-CM | POA: Diagnosis not present

## 2016-06-13 DIAGNOSIS — M79672 Pain in left foot: Secondary | ICD-10-CM | POA: Diagnosis not present

## 2016-07-16 DIAGNOSIS — L820 Inflamed seborrheic keratosis: Secondary | ICD-10-CM | POA: Diagnosis not present

## 2016-07-16 DIAGNOSIS — L309 Dermatitis, unspecified: Secondary | ICD-10-CM | POA: Diagnosis not present

## 2016-07-22 DIAGNOSIS — H2513 Age-related nuclear cataract, bilateral: Secondary | ICD-10-CM | POA: Diagnosis not present

## 2016-07-22 DIAGNOSIS — H25013 Cortical age-related cataract, bilateral: Secondary | ICD-10-CM | POA: Diagnosis not present

## 2016-07-31 DIAGNOSIS — H25012 Cortical age-related cataract, left eye: Secondary | ICD-10-CM | POA: Diagnosis not present

## 2016-07-31 DIAGNOSIS — H2511 Age-related nuclear cataract, right eye: Secondary | ICD-10-CM | POA: Diagnosis not present

## 2016-07-31 DIAGNOSIS — H25011 Cortical age-related cataract, right eye: Secondary | ICD-10-CM | POA: Diagnosis not present

## 2016-07-31 DIAGNOSIS — H2512 Age-related nuclear cataract, left eye: Secondary | ICD-10-CM | POA: Diagnosis not present

## 2016-08-07 DIAGNOSIS — H2512 Age-related nuclear cataract, left eye: Secondary | ICD-10-CM | POA: Diagnosis not present

## 2016-08-07 DIAGNOSIS — H25012 Cortical age-related cataract, left eye: Secondary | ICD-10-CM | POA: Diagnosis not present

## 2016-10-01 DIAGNOSIS — H6123 Impacted cerumen, bilateral: Secondary | ICD-10-CM | POA: Diagnosis not present

## 2016-10-01 DIAGNOSIS — H9203 Otalgia, bilateral: Secondary | ICD-10-CM | POA: Diagnosis not present

## 2016-10-24 ENCOUNTER — Encounter: Payer: Self-pay | Admitting: Physician Assistant

## 2016-10-24 ENCOUNTER — Ambulatory Visit (INDEPENDENT_AMBULATORY_CARE_PROVIDER_SITE_OTHER): Payer: Medicare Other | Admitting: Physician Assistant

## 2016-10-24 VITALS — BP 130/80 | HR 67 | Ht 60.0 in | Wt 169.0 lb

## 2016-10-24 DIAGNOSIS — K580 Irritable bowel syndrome with diarrhea: Secondary | ICD-10-CM

## 2016-10-24 DIAGNOSIS — Z86010 Personal history of colonic polyps: Secondary | ICD-10-CM | POA: Diagnosis not present

## 2016-10-24 DIAGNOSIS — R1084 Generalized abdominal pain: Secondary | ICD-10-CM

## 2016-10-24 MED ORDER — GLYCOPYRROLATE 1 MG PO TABS: 1.0000 mg | ORAL_TABLET | Freq: Two times a day (BID) | ORAL | 11 refills | Status: DC

## 2016-10-24 NOTE — Progress Notes (Signed)
Subjective:    Patient ID: Erica Alvarez, female    DOB: 1945-04-22, 71 y.o.   MRN: [id number]  HPI Erica Alvarez is a pleasant 71 year old white female, known to Dr. Russella Dar with history of diverticulosis, adenomatous colon polyps, family history of colon cancer in her father GERD, and IBS D. She last had colonoscopy in June 2014 with finding of mild diverticulosis left colon there were no polyps noted she does have internal hemorrhoids. She is indicated for 5 year interval follow-up. She was last seen about a year ago with exacerbation of her IBS D. She was started on Robinul Forte and when necessary Imodium. She was also to be treated with Xifaxan for probable bacterial overgrowth. She says that the Xifaxan was too expensive, she was subsequently given samples but only about a week's worth and then several weeks later took a course of metronidazole. She says she did feel better after these treatments. She is also trying to follow somewhat of a fog map diet. She knows that wheat bothers her green beans lactose and  generally tries to avoid these. She's been taking a probiotic/[facility] colon health. She comes in today with abdominal pain which she says is somewhat different and has been present over the past several weeks. She is experiencing a lot of gas and she describes as feeling like trapped gas some increased belching. She has not been using the Robinul Forte says when she does take it is helpful. Her appetite has been good has had some intermittent mild nausea ,no vomiting and some somewhat migratory soreness in her abdomen. She continues to have 2-3 loose bowel movements per day, no hematochezia. She is status post cholecystectomy CT of the abdomen and pelvis have been done in June 2017 which was unrevealing.  Review of Systems Pertinent positive and negative review of systems were noted in the above HPI section.  All other review of systems was otherwise negative.  Outpatient Encounter Prescriptions  as of 10/24/2016  Medication Sig  . acetaminophen (TYLENOL) 500 MG tablet Take 500 mg by mouth every 6 (six) hours as needed for moderate pain. For pain  . aspirin 81 MG tablet Take 81 mg by mouth daily.  . Calcium Carbonate-Vitamin D (CALTRATE 600+D PO) Take 1 tablet by mouth daily.  . cetirizine (ZYRTEC) 10 MG tablet Take 10 mg by mouth daily.  . fenofibrate (TRICOR) 48 MG tablet Take 48 mg by mouth every morning.   . fish oil-omega-3 fatty acids 1000 MG capsule Take 1 g by mouth daily.  . fluticasone (FLONASE) 50 MCG/ACT nasal spray Place 2 sprays into both nostrils every morning.   Marland Kitchen glycopyrrolate (ROBINUL) 1 MG tablet Take 1 tablet (1 mg total) by mouth 2 (two) times daily.  . Multiple Vitamin (MULTIVITAMIN) tablet Take 1 tablet by mouth daily.  . Probiotic Product (PROBIOTIC PO) Take 1 capsule by mouth daily.  . propranolol (INDERAL) 10 MG tablet Take 10 mg by mouth 2 (two) times daily.   Marland Kitchen triamcinolone ointment (KENALOG) 0.1 % Apply topically as needed.  Marland Kitchen VITAMIN D, CHOLECALCIFEROL, PO Take 3 capsules by mouth at bedtime.  . [DISCONTINUED] glycopyrrolate (ROBINUL) 1 MG tablet Take 1 tablet (1 mg total) by mouth 2 (two) times daily as needed.   No facility-administered encounter medications on file as of 10/24/2016.    Allergies  Allergen Reactions  . Medrol [Methylprednisolone] Other (See Comments)    Disorientation/ hot flashes  . Penicillins Itching and Nausea And Vomiting  . Sulfa Antibiotics  Nausea And Vomiting  . Tetracycline Itching and Nausea And Vomiting  . Hydrocodone Itching   Patient Active Problem List   Diagnosis Date Noted  . 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 OA (osteoarthritis) of hip 04/29/2012  . Postop Acute blood loss anemia 12/12/2011  . Postop Hyponatremia 12/12/2011  . OA (osteoarthritis) of knee 12/10/2011  . ANXIETY STATE, UNSPECIFIED 05/13/2008  . ABDOMINAL PAIN-RUQ 05/13/2008  . DIARRHEA 04/15/2008  . CEREBROVASCULAR ACCIDENT, HX OF  10/23/2007  . HYPERLIPIDEMIA 10/22/2007  . HEMORRHOIDS, INTERNAL 10/22/2007  . EXTERNAL HEMORRHOIDS 10/22/2007  . GERD 10/22/2007  . DIVERTICULOSIS, COLON 10/22/2007  . IBS 10/22/2007  . OSTEOPOROSIS 10/22/2007  . TREMOR 10/22/2007  . COLONIC POLYPS, HX OF 10/22/2007   Social History   Social History  . Marital status: Married    Spouse name: N/A  . Number of children: N/A  . Years of education: N/A   Occupational History  . Not on file.   Social History Main Topics  . Smoking status: Former Smoker    Years: 10.00    Quit date: 03/04/1980  . Smokeless tobacco: Never Used  . Alcohol use 0.0 oz/week     Comment: occasional   . Drug use: No  . Sexual activity: Not on file   Other Topics Concern  . Not on file   Social History Narrative  . No narrative on file    Ms. Soy's family history includes COPD in her sister; Cancer - Colon in her father; Colon cancer (age of onset: 75) in her father; Coronary artery disease in her sister; Dementia in her mother.      Objective:    Vitals:   10/24/16 1107  BP: 130/80  Pulse: 67    Physical Exam  well-developed older white female in no acute distress, very pleasant blood pressure 130/80 pulse 67, BMI 33.0. HEENT; nontraumatic normocephalic EOMI PERRLA sclera anicteric, Cardiovascular; regular rate and rhythm with S1-S2 no murmur or gallop, Pulmonary ;clear bilaterally, Abdomen; ;soft, she has some mild tenderness bilateral lower quadrants is no guarding or rebound no palpable mass or hepatosplenomegaly sounds are present, Rectal; exam not done, Ext;no clubbing cyanosis or edema skin warm and dry, Neuropsych; mood and affect appropriate       Assessment & Plan:   #62 71 year old female with history of diverticulosis and IBS D who presents with several week history of generalized somewhat migratory abdominal discomfort, increased gas, loose stools. I suspect her symptoms are secondary to IBS D, possible SIBO # 2 history of  adenomatous colon polyps, no polyps on colonoscopy June 2014 due for follow-up June 2019 #3 internal hemorrhoids #4 diverticulosis #5 family history of colon cancer #6 status post cholecystectomy #7 status post CVA  Plan; patient will start Robinul Forte 2 mg by mouth twice a day on a regular basis rather than when necessary Continue daily probiotic Continue avoidance of known trigger foods  We'll treat her with a full course of Xifaxan 550 mg by mouth 3 times daily 14 days, patient was given samples today to complete course After completing Xifaxan she will also try IB -gard 2 by mouth twice daily as needed. If above measures are not helpful and she continues to experience abdominal pain she's asked to call back for follow-up appointment and will pursue further workup.  Dreya Buhrman S Sapphira Harjo PA-C 10/24/2016   Cc: Juluis Rainier, MD

## 2016-10-24 NOTE — Patient Instructions (Addendum)
We have provided you samples of Xifaxan 550 mg, take 3 capsules daily x 14 days.  ( # 42 capsules).  Take IB GARD- 1-2 capsules twice daily. We have provided you with some samples. You can get this over the counter at your pharmacy or [facility].  We sent refills for the Robinul Forte. Take 1 tab twice daily.   Avoid food triggers.   We made you an appointment with Dr. Fuller Plan for 12-20-2016 at 10:15 am.

## 2016-10-28 NOTE — Progress Notes (Signed)
Reviewed and agree with initial management plan.  Franceen Erisman T. Kanyla Omeara, MD FACG 

## 2016-11-13 DIAGNOSIS — M7652 Patellar tendinitis, left knee: Secondary | ICD-10-CM | POA: Diagnosis not present

## 2016-11-13 DIAGNOSIS — M25861 Other specified joint disorders, right knee: Secondary | ICD-10-CM | POA: Diagnosis not present

## 2016-11-19 DIAGNOSIS — R7301 Impaired fasting glucose: Secondary | ICD-10-CM | POA: Diagnosis not present

## 2016-11-19 DIAGNOSIS — E782 Mixed hyperlipidemia: Secondary | ICD-10-CM | POA: Diagnosis not present

## 2016-11-19 DIAGNOSIS — E559 Vitamin D deficiency, unspecified: Secondary | ICD-10-CM | POA: Diagnosis not present

## 2016-11-19 DIAGNOSIS — D72829 Elevated white blood cell count, unspecified: Secondary | ICD-10-CM | POA: Diagnosis not present

## 2016-11-29 DIAGNOSIS — J309 Allergic rhinitis, unspecified: Secondary | ICD-10-CM | POA: Diagnosis not present

## 2016-11-29 DIAGNOSIS — D72829 Elevated white blood cell count, unspecified: Secondary | ICD-10-CM | POA: Diagnosis not present

## 2016-11-29 DIAGNOSIS — Z23 Encounter for immunization: Secondary | ICD-10-CM | POA: Diagnosis not present

## 2016-11-29 DIAGNOSIS — G250 Essential tremor: Secondary | ICD-10-CM | POA: Diagnosis not present

## 2016-11-29 DIAGNOSIS — M8588 Other specified disorders of bone density and structure, other site: Secondary | ICD-10-CM | POA: Diagnosis not present

## 2016-11-29 DIAGNOSIS — Z1389 Encounter for screening for other disorder: Secondary | ICD-10-CM | POA: Diagnosis not present

## 2016-11-29 DIAGNOSIS — Z8673 Personal history of transient ischemic attack (TIA), and cerebral infarction without residual deficits: Secondary | ICD-10-CM | POA: Diagnosis not present

## 2016-11-29 DIAGNOSIS — E559 Vitamin D deficiency, unspecified: Secondary | ICD-10-CM | POA: Diagnosis not present

## 2016-11-29 DIAGNOSIS — Z0000 Encounter for general adult medical examination without abnormal findings: Secondary | ICD-10-CM | POA: Diagnosis not present

## 2016-11-29 DIAGNOSIS — E782 Mixed hyperlipidemia: Secondary | ICD-10-CM | POA: Diagnosis not present

## 2016-11-29 DIAGNOSIS — R7301 Impaired fasting glucose: Secondary | ICD-10-CM | POA: Diagnosis not present

## 2016-11-29 DIAGNOSIS — F419 Anxiety disorder, unspecified: Secondary | ICD-10-CM | POA: Diagnosis not present

## 2016-12-03 DIAGNOSIS — M1612 Unilateral primary osteoarthritis, left hip: Secondary | ICD-10-CM | POA: Diagnosis not present

## 2016-12-04 DIAGNOSIS — M1612 Unilateral primary osteoarthritis, left hip: Secondary | ICD-10-CM | POA: Diagnosis not present

## 2016-12-19 DIAGNOSIS — Z961 Presence of intraocular lens: Secondary | ICD-10-CM | POA: Diagnosis not present

## 2016-12-20 ENCOUNTER — Ambulatory Visit: Payer: Medicare Other | Admitting: Gastroenterology

## 2016-12-20 DIAGNOSIS — M7061 Trochanteric bursitis, right hip: Secondary | ICD-10-CM | POA: Diagnosis not present

## 2016-12-20 DIAGNOSIS — M1612 Unilateral primary osteoarthritis, left hip: Secondary | ICD-10-CM | POA: Diagnosis not present

## 2016-12-20 DIAGNOSIS — M7062 Trochanteric bursitis, left hip: Secondary | ICD-10-CM | POA: Diagnosis not present

## 2017-01-08 DIAGNOSIS — M7062 Trochanteric bursitis, left hip: Secondary | ICD-10-CM | POA: Diagnosis not present

## 2017-01-08 DIAGNOSIS — M7061 Trochanteric bursitis, right hip: Secondary | ICD-10-CM | POA: Diagnosis not present

## 2017-01-08 DIAGNOSIS — M1612 Unilateral primary osteoarthritis, left hip: Secondary | ICD-10-CM | POA: Diagnosis not present

## 2017-01-15 ENCOUNTER — Ambulatory Visit (INDEPENDENT_AMBULATORY_CARE_PROVIDER_SITE_OTHER): Payer: Medicare Other | Admitting: Gastroenterology

## 2017-01-15 ENCOUNTER — Encounter: Payer: Self-pay | Admitting: Gastroenterology

## 2017-01-15 VITALS — BP 140/90 | HR 68 | Ht 60.25 in | Wt 166.5 lb

## 2017-01-15 DIAGNOSIS — K580 Irritable bowel syndrome with diarrhea: Secondary | ICD-10-CM | POA: Diagnosis not present

## 2017-01-15 DIAGNOSIS — Z86010 Personal history of colonic polyps: Secondary | ICD-10-CM

## 2017-01-15 NOTE — Patient Instructions (Signed)
Normal BMI (Body Mass Index- based on height and weight) is between 23 and 30. Your BMI today is Body mass index is 32.25 kg/m. Erica Alvarez Please consider follow up  regarding your BMI with your Primary Care Provider.   Thank you for choosing me and [facility].  Pricilla Riffle. Dagoberto Ligas., MD., Marval Regal

## 2017-01-15 NOTE — Progress Notes (Signed)
    History of Present Illness: This is a 71 year old female turning for follow-up of IBS D.  Symptoms substantially reduced following a course of Xifaxan.  In addition glycopyrrolate has also substantially helped her symptoms.  She feels significantly improved and is very pleased with the result.  Current Medications, Allergies, Past Medical History, Past Surgical History, Family History and Social History were reviewed in [employer] record.  Physical Exam: General: Well developed, well nourished, no acute distress Head: Normocephalic and atraumatic Eyes:  sclerae anicteric, EOMI Ears: Normal auditory acuity Psychological:  Alert and cooperative. Normal mood and affect  Assessment and Recommendations:  1. IBS-D.  Substantially improved.  Continue glycopyrrolate 1 mg twice daily.  Avoid foods that trigger symptoms.  Continue probiotics if beneficial for symptoms.  If ongoing diarrhea returns consider retreatment with Xifaxan.  2. Personal history of adenomatous colon polyps. Family history of colon cancer.  5-year interval colonoscopy is recommended in June 2019.

## 2017-01-29 DIAGNOSIS — M7062 Trochanteric bursitis, left hip: Secondary | ICD-10-CM | POA: Diagnosis not present

## 2017-01-29 DIAGNOSIS — M7061 Trochanteric bursitis, right hip: Secondary | ICD-10-CM | POA: Diagnosis not present

## 2017-02-07 DIAGNOSIS — M7061 Trochanteric bursitis, right hip: Secondary | ICD-10-CM | POA: Diagnosis not present

## 2017-02-07 DIAGNOSIS — M7062 Trochanteric bursitis, left hip: Secondary | ICD-10-CM | POA: Diagnosis not present

## 2017-02-18 DIAGNOSIS — J069 Acute upper respiratory infection, unspecified: Secondary | ICD-10-CM | POA: Diagnosis not present

## 2017-03-06 DIAGNOSIS — Z85828 Personal history of other malignant neoplasm of skin: Secondary | ICD-10-CM | POA: Diagnosis not present

## 2017-03-06 DIAGNOSIS — L814 Other melanin hyperpigmentation: Secondary | ICD-10-CM | POA: Diagnosis not present

## 2017-03-06 DIAGNOSIS — D1800 Hemangioma unspecified site: Secondary | ICD-10-CM | POA: Diagnosis not present

## 2017-03-06 DIAGNOSIS — D2271 Melanocytic nevi of right lower limb, including hip: Secondary | ICD-10-CM | POA: Diagnosis not present

## 2017-03-06 DIAGNOSIS — L821 Other seborrheic keratosis: Secondary | ICD-10-CM | POA: Diagnosis not present

## 2017-03-06 DIAGNOSIS — Z23 Encounter for immunization: Secondary | ICD-10-CM | POA: Diagnosis not present

## 2017-03-11 NOTE — Progress Notes (Signed)
Need orders on epic for 2-6 surgery

## 2017-03-14 ENCOUNTER — Ambulatory Visit: Payer: Self-pay | Admitting: Orthopedic Surgery

## 2017-03-17 NOTE — Progress Notes (Signed)
LOV cardiologyDr Marlou Porch 08-16-15 epic  Stress test 09-07-15 epic  ECHO 09-07-15 epic

## 2017-03-17 NOTE — Patient Instructions (Addendum)
ROSELINA BURGUENO  03/17/2017   Your procedure is scheduled on: 03-26-17  Report to [facility] Main  Entrance    Report to admitting at 2:40PM   Call this number if you have problems the morning of surgery [phone]     Remember: Do not eat food After Midnight. You may have clear liquids from midnight until 9am day of surgery. Nothing by mouth after 9am!     Take these medicines the morning of surgery with A SIP OF WATER: tylenol if needed, propanolol, nasal spray if needed                                  You may not have any metal on your body including hair pins and              piercings  Do not wear jewelry, make-up, lotions, powders or perfumes, deodorant                Men may shave face and neck.   Do not bring valuables to the hospital. [place].  Contacts, dentures or bridgework may not be worn into surgery.  Leave suitcase in the car. After surgery it may be brought to your room.                 Please read over the following fact sheets you were given: _____________________________________________________________________    CLEAR LIQUID DIET   Foods Allowed                                                                     Foods Excluded  Coffee and tea, regular and decaf                             liquids that you cannot  Plain Jell-O in any flavor                                             see through such as: Fruit ices (not with fruit pulp)                                     milk, soups, orange juice  Iced Popsicles                                    All solid food Carbonated beverages, regular and diet                                    Cranberry, grape and apple juices Sports drinks like Gatorade Lightly seasoned clear broth or consume(fat  free) Sugar, honey syrup  Sample Menu Breakfast                                Lunch                                      Supper Cranberry juice                    Beef broth                            Chicken broth Jell-O                                     Grape juice                           Apple juice Coffee or tea                        Jell-O                                      Popsicle                                                Coffee or tea                        Coffee or tea  _____________________________________________________________________  [facility] - Preparing for Surgery Before surgery, you can play an important role.  Because skin is not sterile, your skin needs to be as free of germs as possible.  You can reduce the number of germs on your skin by washing with CHG (chlorahexidine gluconate) soap before surgery.  CHG is an antiseptic cleaner which kills germs and bonds with the skin to continue killing germs even after washing. Please DO NOT use if you have an allergy to CHG or antibacterial soaps.  If your skin becomes reddened/irritated stop using the CHG and inform your nurse when you arrive at Short Stay. Do not shave (including legs and underarms) for at least 48 hours prior to the first CHG shower.  You may shave your face/neck. Please follow these instructions carefully:  1.  Shower with CHG Soap the night before surgery and the  morning of Surgery.  2.  If you choose to wash your hair, wash your hair first as usual with your  normal  shampoo.  3.  After you shampoo, rinse your hair and body thoroughly to remove the  shampoo.                           4.  Use CHG as you would any other liquid soap.  You can apply chg directly  to the skin and wash  Gently with a scrungie or clean washcloth.  5.  Apply the CHG Soap to your body ONLY FROM THE NECK DOWN.   Do not use on face/ open                           Wound or open sores. Avoid contact with eyes, ears mouth and genitals (private parts).                       Wash face,  Genitals (private parts) with your  normal soap.             6.  Wash thoroughly, paying special attention to the area where your surgery  will be performed.  7.  Thoroughly rinse your body with warm water from the neck down.  8.  DO NOT shower/wash with your normal soap after using and rinsing off  the CHG Soap.                9.  Pat yourself dry with a clean towel.            10.  Wear clean pajamas.            11.  Place clean sheets on your bed the night of your first shower and do not  sleep with pets. Day of Surgery : Do not apply any lotions/deodorants the morning of surgery.  Please wear clean clothes to the hospital/surgery center.  FAILURE TO FOLLOW THESE INSTRUCTIONS MAY RESULT IN THE CANCELLATION OF YOUR SURGERY PATIENT SIGNATURE_________________________________  NURSE SIGNATURE__________________________________  ________________________________________________________________________

## 2017-03-19 ENCOUNTER — Other Ambulatory Visit: Payer: Self-pay

## 2017-03-19 ENCOUNTER — Encounter (HOSPITAL_COMMUNITY): Payer: Self-pay

## 2017-03-19 ENCOUNTER — Encounter (HOSPITAL_COMMUNITY)
Admission: RE | Admit: 2017-03-19 | Discharge: 2017-03-19 | Disposition: A | Discharge status: Home / Self Care | Payer: Medicare Other | Source: Ambulatory Visit | Attending: Orthopedic Surgery | Admitting: Orthopedic Surgery

## 2017-03-19 DIAGNOSIS — Z01810 Encounter for preprocedural cardiovascular examination: Secondary | ICD-10-CM | POA: Diagnosis not present

## 2017-03-19 DIAGNOSIS — Z01812 Encounter for preprocedural laboratory examination: Secondary | ICD-10-CM | POA: Insufficient documentation

## 2017-03-19 DIAGNOSIS — Z79899 Other long term (current) drug therapy: Secondary | ICD-10-CM | POA: Insufficient documentation

## 2017-03-19 HISTORY — DX: Other injury of unspecified body region, initial encounter: T14.8XXA

## 2017-03-19 LAB — CBC
HCT: 37.6 % (ref 36.0–46.0)
Hemoglobin: 11.7 g/dL — ABNORMAL LOW (ref 12.0–15.0)
MCH: 28 pg (ref 26.0–34.0)
MCHC: 31.1 g/dL (ref 30.0–36.0)
MCV: 90 fL (ref 78.0–100.0)
Platelets: 324 10*3/uL (ref 150–400)
RBC: 4.18 MIL/uL (ref 3.87–5.11)
RDW: 14.4 % (ref 11.5–15.5)
WBC: 11.2 10*3/uL — AB (ref 4.0–10.5)

## 2017-03-19 LAB — BASIC METABOLIC PANEL
ANION GAP: 6 (ref 5–15)
BUN: 14 mg/dL (ref 6–20)
CALCIUM: 9.3 mg/dL (ref 8.9–10.3)
CO2: 27 mmol/L (ref 22–32)
CREATININE: 0.77 mg/dL (ref 0.44–1.00)
Chloride: 105 mmol/L (ref 101–111)
GFR calc Af Amer: >60 mL/min (ref >60)
Glucose, Bld: 98 mg/dL (ref 65–99)
POTASSIUM: 4.1 mmol/L (ref 3.5–5.1)
Sodium: 138 mmol/L (ref 135–145)

## 2017-03-25 MED ORDER — BUPIVACAINE LIPOSOME 1.3 % IJ SUSP
20.0000 mL | INTRAMUSCULAR | Status: DC
  Filled 2017-03-25: qty 20

## 2017-03-26 ENCOUNTER — Other Ambulatory Visit: Payer: Self-pay

## 2017-03-26 ENCOUNTER — Ambulatory Visit (HOSPITAL_COMMUNITY): Payer: Medicare Other | Admitting: Certified Registered Nurse Anesthetist

## 2017-03-26 ENCOUNTER — Encounter (HOSPITAL_COMMUNITY): Payer: Self-pay | Admitting: *Deleted

## 2017-03-26 ENCOUNTER — Observation Stay (HOSPITAL_COMMUNITY)
Admission: RE | Admit: 2017-03-26 | Discharge: 2017-03-27 | Disposition: A | Discharge status: Home / Self Care | Payer: Medicare Other | Source: Ambulatory Visit | Attending: Orthopedic Surgery | Admitting: Orthopedic Surgery

## 2017-03-26 ENCOUNTER — Encounter (HOSPITAL_COMMUNITY)
Admission: RE | Disposition: A | Discharge status: Home / Self Care | Payer: Self-pay | Source: Ambulatory Visit | Attending: Orthopedic Surgery

## 2017-03-26 DIAGNOSIS — Z880 Allergy status to penicillin: Secondary | ICD-10-CM | POA: Insufficient documentation

## 2017-03-26 DIAGNOSIS — Z882 Allergy status to sulfonamides status: Secondary | ICD-10-CM | POA: Insufficient documentation

## 2017-03-26 DIAGNOSIS — Z7982 Long term (current) use of aspirin: Secondary | ICD-10-CM | POA: Insufficient documentation

## 2017-03-26 DIAGNOSIS — K219 Gastro-esophageal reflux disease without esophagitis: Secondary | ICD-10-CM | POA: Insufficient documentation

## 2017-03-26 DIAGNOSIS — Z888 Allergy status to other drugs, medicaments and biological substances status: Secondary | ICD-10-CM | POA: Insufficient documentation

## 2017-03-26 DIAGNOSIS — Z86718 Personal history of other venous thrombosis and embolism: Secondary | ICD-10-CM | POA: Insufficient documentation

## 2017-03-26 DIAGNOSIS — S76012A Strain of muscle, fascia and tendon of left hip, initial encounter: Secondary | ICD-10-CM | POA: Diagnosis not present

## 2017-03-26 DIAGNOSIS — Z86010 Personal history of colonic polyps: Secondary | ICD-10-CM | POA: Insufficient documentation

## 2017-03-26 DIAGNOSIS — M170 Bilateral primary osteoarthritis of knee: Secondary | ICD-10-CM | POA: Diagnosis not present

## 2017-03-26 DIAGNOSIS — Z85828 Personal history of other malignant neoplasm of skin: Secondary | ICD-10-CM | POA: Diagnosis not present

## 2017-03-26 DIAGNOSIS — G250 Essential tremor: Secondary | ICD-10-CM | POA: Insufficient documentation

## 2017-03-26 DIAGNOSIS — S76312A Strain of muscle, fascia and tendon of the posterior muscle group at thigh level, left thigh, initial encounter: Secondary | ICD-10-CM | POA: Diagnosis not present

## 2017-03-26 DIAGNOSIS — Z885 Allergy status to narcotic agent status: Secondary | ICD-10-CM | POA: Insufficient documentation

## 2017-03-26 DIAGNOSIS — Z96653 Presence of artificial knee joint, bilateral: Secondary | ICD-10-CM | POA: Insufficient documentation

## 2017-03-26 DIAGNOSIS — M67854 Other specified disorders of tendon, left hip: Secondary | ICD-10-CM | POA: Diagnosis not present

## 2017-03-26 DIAGNOSIS — M19042 Primary osteoarthritis, left hand: Secondary | ICD-10-CM | POA: Insufficient documentation

## 2017-03-26 DIAGNOSIS — X58XXXA Exposure to other specified factors, initial encounter: Secondary | ICD-10-CM | POA: Insufficient documentation

## 2017-03-26 DIAGNOSIS — M7062 Trochanteric bursitis, left hip: Secondary | ICD-10-CM | POA: Diagnosis not present

## 2017-03-26 DIAGNOSIS — Z87891 Personal history of nicotine dependence: Secondary | ICD-10-CM | POA: Insufficient documentation

## 2017-03-26 DIAGNOSIS — Z79899 Other long term (current) drug therapy: Secondary | ICD-10-CM | POA: Diagnosis not present

## 2017-03-26 DIAGNOSIS — Z96641 Presence of right artificial hip joint: Secondary | ICD-10-CM | POA: Insufficient documentation

## 2017-03-26 DIAGNOSIS — Z8719 Personal history of other diseases of the digestive system: Secondary | ICD-10-CM | POA: Diagnosis not present

## 2017-03-26 DIAGNOSIS — E785 Hyperlipidemia, unspecified: Secondary | ICD-10-CM | POA: Diagnosis not present

## 2017-03-26 DIAGNOSIS — M1612 Unilateral primary osteoarthritis, left hip: Secondary | ICD-10-CM | POA: Diagnosis not present

## 2017-03-26 DIAGNOSIS — Z881 Allergy status to other antibiotic agents status: Secondary | ICD-10-CM | POA: Diagnosis not present

## 2017-03-26 DIAGNOSIS — M19041 Primary osteoarthritis, right hand: Secondary | ICD-10-CM | POA: Diagnosis not present

## 2017-03-26 DIAGNOSIS — Z8673 Personal history of transient ischemic attack (TIA), and cerebral infarction without residual deficits: Secondary | ICD-10-CM | POA: Insufficient documentation

## 2017-03-26 HISTORY — PX: OPEN SURGICAL REPAIR OF GLUTEAL TENDON: SHX5995

## 2017-03-26 SURGERY — REPAIR, TENDON, GLUTEUS MEDIUS, OPEN
Anesthesia: Spinal | Site: Hip | Laterality: Left

## 2017-03-26 MED ORDER — SODIUM CHLORIDE 0.9 % IR SOLN
Status: DC | PRN
  Administered 2017-03-26: 1000 mL

## 2017-03-26 MED ORDER — PHENYLEPHRINE 40 MCG/ML (10ML) SYRINGE FOR IV PUSH (FOR BLOOD PRESSURE SUPPORT)
PREFILLED_SYRINGE | INTRAVENOUS | Status: AC
  Filled 2017-03-26: qty 10

## 2017-03-26 MED ORDER — PROPOFOL 10 MG/ML IV BOLUS
INTRAVENOUS | Status: AC
  Filled 2017-03-26: qty 20

## 2017-03-26 MED ORDER — PROMETHAZINE HCL 25 MG/ML IJ SOLN: 6.2500 mg | INTRAMUSCULAR | Status: DC | PRN

## 2017-03-26 MED ORDER — SODIUM CHLORIDE 0.9 % IV SOLN
INTRAVENOUS | Status: DC
  Administered 2017-03-26: 20:00:00 via INTRAVENOUS

## 2017-03-26 MED ORDER — ONDANSETRON HCL 4 MG/2ML IJ SOLN
INTRAMUSCULAR | Status: DC | PRN
  Administered 2017-03-26: 4 mg via INTRAVENOUS

## 2017-03-26 MED ORDER — DEXTROSE 5 % IV SOLN
500.0000 mg | Freq: Four times a day (QID) | INTRAVENOUS | Status: DC | PRN
  Administered 2017-03-26: 500 mg via INTRAVENOUS
  Filled 2017-03-26: qty 550

## 2017-03-26 MED ORDER — LORATADINE 10 MG PO TABS
10.0000 mg | ORAL_TABLET | Freq: Every day | ORAL | Status: DC
  Administered 2017-03-27: 10:00:00 10 mg via ORAL
  Filled 2017-03-26: qty 1

## 2017-03-26 MED ORDER — ONDANSETRON HCL 4 MG/2ML IJ SOLN
INTRAMUSCULAR | Status: AC
  Filled 2017-03-26: qty 2

## 2017-03-26 MED ORDER — MIDAZOLAM HCL 2 MG/2ML IJ SOLN
INTRAMUSCULAR | Status: AC
  Filled 2017-03-26: qty 2

## 2017-03-26 MED ORDER — FENTANYL CITRATE (PF) 100 MCG/2ML IJ SOLN
INTRAMUSCULAR | Status: DC | PRN
  Administered 2017-03-26 (×2): 50 ug via INTRAVENOUS

## 2017-03-26 MED ORDER — OXYCODONE HCL 5 MG PO TABS
5.0000 mg | ORAL_TABLET | ORAL | Status: DC | PRN
  Administered 2017-03-26: 5 mg via ORAL
  Filled 2017-03-26 (×3): qty 1

## 2017-03-26 MED ORDER — PROPOFOL 10 MG/ML IV BOLUS
INTRAVENOUS | Status: DC | PRN
  Administered 2017-03-26: 20 mg via INTRAVENOUS

## 2017-03-26 MED ORDER — VANCOMYCIN HCL IN DEXTROSE 1-5 GM/200ML-% IV SOLN
1000.0000 mg | Freq: Two times a day (BID) | INTRAVENOUS | Status: AC
  Administered 2017-03-27: 1000 mg via INTRAVENOUS
  Filled 2017-03-26: qty 200

## 2017-03-26 MED ORDER — ONDANSETRON HCL 4 MG PO TABS: 4.0000 mg | ORAL_TABLET | Freq: Four times a day (QID) | ORAL | Status: DC | PRN

## 2017-03-26 MED ORDER — METHOCARBAMOL 500 MG PO TABS
500.0000 mg | ORAL_TABLET | Freq: Four times a day (QID) | ORAL | Status: DC | PRN
  Administered 2017-03-27: 500 mg via ORAL
  Filled 2017-03-26: qty 1

## 2017-03-26 MED ORDER — DEXAMETHASONE SODIUM PHOSPHATE 10 MG/ML IJ SOLN
INTRAMUSCULAR | Status: AC
  Filled 2017-03-26: qty 1

## 2017-03-26 MED ORDER — PROPOFOL 500 MG/50ML IV EMUL
INTRAVENOUS | Status: DC | PRN
  Administered 2017-03-26: 75 ug/kg/min via INTRAVENOUS

## 2017-03-26 MED ORDER — TRAMADOL HCL 50 MG PO TABS
50.0000 mg | ORAL_TABLET | Freq: Four times a day (QID) | ORAL | Status: DC | PRN
  Administered 2017-03-26: 100 mg via ORAL
  Filled 2017-03-26: qty 2

## 2017-03-26 MED ORDER — SODIUM CHLORIDE 0.9 % IJ SOLN
INTRAMUSCULAR | Status: DC | PRN
  Administered 2017-03-26: 40 mL

## 2017-03-26 MED ORDER — ONDANSETRON HCL 4 MG/2ML IJ SOLN
4.0000 mg | Freq: Four times a day (QID) | INTRAMUSCULAR | Status: DC | PRN
  Administered 2017-03-27: 4 mg via INTRAVENOUS
  Filled 2017-03-26: qty 2

## 2017-03-26 MED ORDER — SODIUM CHLORIDE 0.9 % IJ SOLN
INTRAMUSCULAR | Status: AC
  Filled 2017-03-26: qty 50

## 2017-03-26 MED ORDER — ACETAMINOPHEN 500 MG PO TABS
1000.0000 mg | ORAL_TABLET | Freq: Four times a day (QID) | ORAL | Status: DC
  Administered 2017-03-26 – 2017-03-27 (×3): 1000 mg via ORAL
  Filled 2017-03-26 (×3): qty 2

## 2017-03-26 MED ORDER — POVIDONE-IODINE 10 % EX SWAB: 2.0000 "application " | Freq: Once | CUTANEOUS | Status: DC

## 2017-03-26 MED ORDER — LACTATED RINGERS IV SOLN
INTRAVENOUS | Status: DC
  Administered 2017-03-26 (×2): via INTRAVENOUS

## 2017-03-26 MED ORDER — MIDAZOLAM HCL 5 MG/5ML IJ SOLN
INTRAMUSCULAR | Status: DC | PRN
  Administered 2017-03-26: 2 mg via INTRAVENOUS

## 2017-03-26 MED ORDER — FLUTICASONE PROPIONATE 50 MCG/ACT NA SUSP: 2.0000 | Freq: Every day | NASAL | Status: DC | PRN

## 2017-03-26 MED ORDER — FENTANYL CITRATE (PF) 100 MCG/2ML IJ SOLN
INTRAMUSCULAR | Status: AC
  Filled 2017-03-26: qty 2

## 2017-03-26 MED ORDER — EPHEDRINE 5 MG/ML INJ
INTRAVENOUS | Status: AC
  Filled 2017-03-26: qty 10

## 2017-03-26 MED ORDER — OXYCODONE HCL 5 MG PO TABS
10.0000 mg | ORAL_TABLET | ORAL | Status: DC | PRN
  Administered 2017-03-26 – 2017-03-27 (×3): 10 mg via ORAL
  Filled 2017-03-26 (×2): qty 2

## 2017-03-26 MED ORDER — VANCOMYCIN HCL IN DEXTROSE 1-5 GM/200ML-% IV SOLN
1000.0000 mg | INTRAVENOUS | Status: AC
  Administered 2017-03-26: 1000 mg via INTRAVENOUS
  Filled 2017-03-26: qty 200

## 2017-03-26 MED ORDER — SODIUM CHLORIDE 0.9 % IJ SOLN
INTRAMUSCULAR | Status: AC
  Filled 2017-03-26: qty 10

## 2017-03-26 MED ORDER — PHENYLEPHRINE 40 MCG/ML (10ML) SYRINGE FOR IV PUSH (FOR BLOOD PRESSURE SUPPORT)
PREFILLED_SYRINGE | INTRAVENOUS | Status: DC | PRN
  Administered 2017-03-26 (×4): 80 ug via INTRAVENOUS

## 2017-03-26 MED ORDER — METOCLOPRAMIDE HCL 5 MG PO TABS: 5.0000 mg | ORAL_TABLET | Freq: Three times a day (TID) | ORAL | Status: DC | PRN

## 2017-03-26 MED ORDER — MORPHINE SULFATE (PF) 2 MG/ML IV SOLN: 1.0000 mg | INTRAVENOUS | Status: DC | PRN

## 2017-03-26 MED ORDER — ENOXAPARIN SODIUM 40 MG/0.4ML ~~LOC~~ SOLN
40.0000 mg | SUBCUTANEOUS | Status: DC
  Administered 2017-03-27: 08:00:00 40 mg via SUBCUTANEOUS
  Filled 2017-03-26: qty 0.4

## 2017-03-26 MED ORDER — PROPRANOLOL HCL 10 MG PO TABS
10.0000 mg | ORAL_TABLET | Freq: Two times a day (BID) | ORAL | Status: DC
  Administered 2017-03-26 – 2017-03-27 (×2): 10 mg via ORAL
  Filled 2017-03-26 (×2): qty 1

## 2017-03-26 MED ORDER — METOCLOPRAMIDE HCL 5 MG/ML IJ SOLN: 5.0000 mg | Freq: Three times a day (TID) | INTRAMUSCULAR | Status: DC | PRN

## 2017-03-26 MED ORDER — CHLORHEXIDINE GLUCONATE 4 % EX LIQD: 60.0000 mL | Freq: Once | CUTANEOUS | Status: DC

## 2017-03-26 MED ORDER — DEXAMETHASONE SODIUM PHOSPHATE 4 MG/ML IJ SOLN
INTRAMUSCULAR | Status: DC | PRN
  Administered 2017-03-26: 10 mg via INTRAVENOUS

## 2017-03-26 MED ORDER — BUPIVACAINE LIPOSOME 1.3 % IJ SUSP
INTRAMUSCULAR | Status: DC | PRN
  Administered 2017-03-26: 20 mL

## 2017-03-26 MED ORDER — HYDROMORPHONE HCL 1 MG/ML IJ SOLN: 0.2500 mg | INTRAMUSCULAR | Status: DC | PRN

## 2017-03-26 MED ORDER — ACETAMINOPHEN 10 MG/ML IV SOLN
1000.0000 mg | Freq: Once | INTRAVENOUS | Status: AC
  Administered 2017-03-26: 1000 mg via INTRAVENOUS
  Filled 2017-03-26: qty 100

## 2017-03-26 MED ORDER — GLYCOPYRROLATE 1 MG PO TABS
1.0000 mg | ORAL_TABLET | Freq: Two times a day (BID) | ORAL | Status: DC | PRN
  Filled 2017-03-26: qty 1

## 2017-03-26 SURGICAL SUPPLY — 38 items
ANCH SUT 2 CP-2 EBND QANCHR+ (Anchor) ×1 IMPLANT
ANCHOR SUPER QUICK (Anchor) ×2 IMPLANT
BAG SPEC THK2 15X12 ZIP CLS (MISCELLANEOUS)
BAG ZIPLOCK 12X15 (MISCELLANEOUS) IMPLANT
BLADE EXTENDED COATED 6.5IN (ELECTRODE) ×2 IMPLANT
COVER SURGICAL LIGHT HANDLE (MISCELLANEOUS) ×2 IMPLANT
DRAPE INCISE IOBAN 66X45 STRL (DRAPES) ×2 IMPLANT
DRAPE ORTHO SPLIT 77X108 STRL (DRAPES) ×2
DRAPE POUCH INSTRU U-SHP 10X18 (DRAPES) ×2 IMPLANT
DRAPE SURG ORHT 6 SPLT 77X108 (DRAPES) ×2 IMPLANT
DRAPE U-SHAPE 47X51 STRL (DRAPES) ×2 IMPLANT
DRSG ADAPTIC 3X8 NADH LF (GAUZE/BANDAGES/DRESSINGS) ×2 IMPLANT
DRSG MEPILEX BORDER 4X4 (GAUZE/BANDAGES/DRESSINGS) ×2 IMPLANT
DRSG MEPILEX BORDER 4X8 (GAUZE/BANDAGES/DRESSINGS) ×2 IMPLANT
ELECT REM PT RETURN 15FT ADLT (MISCELLANEOUS) ×2 IMPLANT
GLOVE BIO SURGEON STRL SZ7.5 (GLOVE) ×2 IMPLANT
GLOVE BIO SURGEON STRL SZ8 (GLOVE) ×2 IMPLANT
GLOVE BIOGEL PI IND STRL 8 (GLOVE) ×2 IMPLANT
GLOVE BIOGEL PI INDICATOR 8 (GLOVE) ×2
GOWN STRL REUS W/TWL LRG LVL3 (GOWN DISPOSABLE) ×2 IMPLANT
GOWN STRL REUS W/TWL XL LVL3 (GOWN DISPOSABLE) ×2 IMPLANT
KIT BASIN OR (CUSTOM PROCEDURE TRAY) ×2 IMPLANT
MANIFOLD NEPTUNE II (INSTRUMENTS) ×2 IMPLANT
NDL SAFETY ECLIPSE 18X1.5 (NEEDLE) ×2 IMPLANT
NEEDLE HYPO 18GX1.5 SHARP (NEEDLE) ×4
NS IRRIG 1000ML POUR BTL (IV SOLUTION) ×2 IMPLANT
PACK TOTAL JOINT (CUSTOM PROCEDURE TRAY) ×2 IMPLANT
POSITIONER SURGICAL ARM (MISCELLANEOUS) ×2 IMPLANT
STAPLER VISISTAT 35W (STAPLE) IMPLANT
STRIP CLOSURE SKIN 1/2X4 (GAUZE/BANDAGES/DRESSINGS) ×2 IMPLANT
SUT MNCRL AB 4-0 PS2 18 (SUTURE) ×2 IMPLANT
SUT VIC AB 1 CT1 36 (SUTURE) ×2 IMPLANT
SUT VIC AB 2-0 CT1 27 (SUTURE) ×4
SUT VIC AB 2-0 CT1 TAPERPNT 27 (SUTURE) ×2 IMPLANT
SUT VLOC 180 0 24IN GS25 (SUTURE) ×2 IMPLANT
SYR 20CC LL (SYRINGE) ×2 IMPLANT
SYR 50ML LL SCALE MARK (SYRINGE) ×2 IMPLANT
TOWEL OR 17X26 10 PK STRL BLUE (TOWEL DISPOSABLE) ×4 IMPLANT

## 2017-03-26 NOTE — Brief Op Note (Signed)
03/26/2017  4:45 PM  PATIENT:  Erica Alvarez  72 y.o. female  PRE-OPERATIVE DIAGNOSIS:  Left hip gluteal tendon tear  POST-OPERATIVE DIAGNOSIS:  Left hip gluteal tendon tear  PROCEDURE:  Procedure(s): OPEN SURGICAL REPAIR OF LEFT GLUTEAL TENDON (Left)  SURGEON:  Surgeon(s) and Role:    Gaynelle Arabian, MD - Primary  PHYSICIAN ASSISTANT:   ASSISTANTS: Arlee Muslim, PA-C   ANESTHESIA:   spinal  EBL:10 ml  BLOOD ADMINISTERED:none  DRAINS: (Medium) Hemovact drain(s) in the left hip with  Suction Open   LOCAL MEDICATIONS USED:  OTHER Exparel  COUNTS:  YES  TOURNIQUET:  * No tourniquets in log *  DICTATION: .Other Dictation: Dictation Number [id number]  PLAN OF CARE: Admit for overnight observation  PATIENT DISPOSITION:  PACU - hemodynamically stable.

## 2017-03-26 NOTE — Transfer of Care (Signed)
Immediate Anesthesia Transfer of Care Note  Patient: Erica Alvarez  Procedure(s) Performed: OPEN SURGICAL REPAIR OF LEFT GLUTEAL TENDON (Left Hip)  Patient Location: PACU  Anesthesia Type:Spinal  Level of Consciousness: awake, alert , oriented and patient cooperative  Airway & Oxygen Therapy: Patient Spontanous Breathing and Patient connected to face mask  Post-op Assessment: Report given to RN and Post -op Vital signs reviewed and stable  Post vital signs: Reviewed and stable  Last Vitals:  Vitals:   03/26/17 1346  BP: (!) 147/96  Pulse: 71  Resp: 18  Temp: 36.7 C  SpO2: 98%    Last Pain:  Vitals:   03/26/17 1358  TempSrc:   PainSc: 3       Patients Stated Pain Goal: 4 (61/51/83 4373)  Complications: No apparent anesthesia complications

## 2017-03-26 NOTE — Interval H&P Note (Signed)
History and Physical Interval Note:  03/26/2017 2:29 PM  Erica Alvarez  has presented today for surgery, with the diagnosis of Left hip gluteal tendon tear  The various methods of treatment have been discussed with the patient and family. After consideration of risks, benefits and other options for treatment, the patient has consented to  Procedure(s): OPEN SURGICAL REPAIR OF LEFT GLUTEAL TENDON (Left) as a surgical intervention .  The patient's history has been reviewed, patient examined, no change in status, stable for surgery.  I have reviewed the patient's chart and labs.  Questions were answered to the patient's satisfaction.     Pilar Plate Anetta Olvera

## 2017-03-26 NOTE — Anesthesia Procedure Notes (Signed)
Spinal  Patient location during procedure: OR Start time: 03/26/2017 3:40 PM End time: 03/26/2017 3:50 PM Staffing Anesthesiologist: Myrtie Soman, MD Performed: anesthesiologist  Preanesthetic Checklist Completed: patient identified, site marked, surgical consent, pre-op evaluation, timeout performed, IV checked, risks and benefits discussed and monitors and equipment checked Spinal Block Patient position: sitting Prep: Betadine Patient monitoring: heart rate, continuous pulse ox and blood pressure Injection technique: single-shot Needle Needle type: Sprotte  Needle gauge: 24 G Needle length: 9 cm Additional Notes Expiration date of kit checked and confirmed. Patient tolerated procedure well, without complications.

## 2017-03-26 NOTE — Op Note (Signed)
NAMETRINIDAD, INGLE NO.:  [id number]  MEDICAL RECORD NO.:  [id number]  LOCATION:                                 FACILITY:  PHYSICIAN:  Gaynelle Arabian, M.D.         DATE OF BIRTH:  DATE OF PROCEDURE:  03/26/2017 DATE OF DISCHARGE:                              OPERATIVE REPORT   PREOPERATIVE DIAGNOSIS:  Left hip gluteal tendon tear with intractable bursitis.  POSTOPERATIVE DIAGNOSIS:  Left hip gluteal tendon tear with intractable bursitis.  PROCEDURE:  Left hip bursectomy with gluteal tendon repair.  SURGEON:  Gaynelle Arabian, M.D.  ASSISTANT:  Alexzandrew L. Perkins, P.A.C.  ANESTHESIA:  Spinal.  ESTIMATED BLOOD LOSS:  Minimal.  DRAINS:  Hemovac x1.  COMPLICATIONS:  None.  CONDITION:  Stable to recovery.  BRIEF CLINICAL NOTE:  [name] is a 72 year old female with intractable pain along the lateral aspect of her left hip.  She has had cortisone injection and exercise without benefit.  An MRI showed a gluteal tendon tear in the gluteus medius.  She presents now for bursectomy and tendon repair.  PROCEDURE IN DETAIL:  After successful administration of spinal anesthetic, the patient was placed in the right lateral decubitus position with the left side up and held with the hip positioner.  Left lower extremity was isolated from her perineum with plastic drapes, and prepped and draped in the usual sterile fashion.  A short lateral incision about 3 inches long was made over the tip of the greater trochanter, was centered at the tip of the greater trochanter.  Skin was cut with a 10-blade through the subcutaneous tissue to the fascia lata, which incised in line with the skin incision.  She has an extremely tight iliotibial band.  I had to make a relaxing incision perpendicular to the longitudinal incision in the band.  This effectively reduced some of the tension in the ID band.  She did have a tear on the anterior aspect of the gluteus medius with  slight retraction of the tendon.  Her bursa was removed with electrocautery.  There were couple of spurs coming off the greater trochanter and I removed those.  We then used a rongeur to roughen up the bed for the tendon repair.  Mitek anchor was placed and the sutures were then passed through the free edge of the tear and the tear was advanced back to the greater trochanter and then tied down and oversewn.  The previous bald area was very well covered now with a stable tendon repair.  The wound was then copiously irrigated with saline solution and hemostasis achieved with electrocautery. Hemovac drain was placed.  A total of 20 mL of Exparel mixed with 40 mL of saline was injected into the fascia lata, the gluteal muscles, the subcutaneous tissues.  The fascia lata was then closed using interrupted #1 Vicryl, leaving up a small area over the tip of the greater trochanter to prevent friction.  The subcu was then closed in 2 layers with interrupted 2-0 Vicryl in subcuticular, closed with running 4-0 Monocryl.  The incision was cleaned and dried, and Steri-Strips and a bulky sterile dressing applied.  She was then awakened and transported to recovery in stable condition.     Gaynelle Arabian, M.D.     FA/MEDQ  D:  03/26/2017  T:  03/26/2017  Job:  [id number]

## 2017-03-26 NOTE — H&P (Signed)
CC- Erica Alvarez is a 72 y.o. female who presents with left hip pain  Hip Pain: Patient complains of left hip pain. Onset of the symptoms was several months ago. Inciting event: none. Current symptoms include lateral pain at rest and with activity. Associated symptoms: none. Aggravating symptoms: pivoting, rising after sitting and lying on left side. Patient's course of pain: gradually worsening. Patient has had no prior hip problems. Previous visits for this problem. Evaluation to date: MRI shows gluteal tendon tear. Injection provided partial temporary relief and exercise did not help.   Past Medical History:  Diagnosis Date  . Acute sinus infection    03-14-2014 current taking Z-Pak-- no fever but non-productive cough  . Benign essential tremor    takes inderal  . Diverticulitis   . Family history of adverse reaction to anesthesia    "sister had episode hypotension"  . History of basal cell carcinoma excision    nose  . History of colon polyps   . History of CVA (cerebrovascular accident) without residual deficits    [id number]  embolic right frontal MCA -- found to have PFO and Atrial Septum aneursym /  s/p  closure of PFO  . History of DVT of lower extremity    2003  . History of gastroesophageal reflux (GERD)   . OA (osteoarthritis)    knees and hands  . Patellar clunk syndrome of left knee   . S/P patent foramen ovale closure dx post cva w/ PFO with atrial septal aneursym   07-01-2005  via Intracardiac echo probe with deployment of CardioSEAL septal occluder  . Tendon tear    left gluteal tendon     Past Surgical History:  Procedure Laterality Date  . ABDOMINAL HYSTERECTOMY  1982  . APPENDECTOMY  1960's  . EYE SURGERY  2018   cataract extraction with lens placement    . KNEE ARTHROSCOPY Bilateral left 02-21-2010/  right 11-04-2007   bilateral 2001  . KNEE ARTHROSCOPY Left 03/22/2015   Procedure: LEFT KNEE ARTHROSCOPY AND;  Surgeon: Gaynelle Arabian, MD;  Location: [facility];  Service: Orthopedics;  Laterality: Left;  . LAPAROSCOPIC CHOLECYSTECTOMY  1999  . PATENT FORAMEN OVALE CLOSURE  07-01-2005  dr Einar Gip   via Intracardiac echocardiogram successful placement CardioSEAL septal occluder (PFO measured about 80mm)  . SYNOVECTOMY Left 03/22/2015   Procedure: SYNOVECTOMY;  Surgeon: Gaynelle Arabian, MD;  Location: [facility];  Service: Orthopedics;  Laterality: Left;  . TOENAIL TRIMMING    . TONSILLECTOMY  1960's  . TOTAL HIP ARTHROPLASTY Right 04/29/2012   Procedure: TOTAL HIP ARTHROPLASTY ANTERIOR APPROACH;  Surgeon: [name], MD;  Location: [facility];  Service: Orthopedics;  Laterality: Right;  . TOTAL KNEE ARTHROPLASTY  12/10/2011   Procedure: TOTAL KNEE ARTHROPLASTY;  Surgeon: [name], MD;  Location: [facility];  Service: Orthopedics;  Laterality: Left;  . TOTAL KNEE ARTHROPLASTY Right 08-08-2008    Prior to Admission medications   Medication Sig Start Date End Date Taking? Authorizing Provider  acetaminophen (TYLENOL) 500 MG tablet Take 1,000 mg by mouth every 6 (six) hours as needed for moderate pain or headache.    Yes [provider]  ARTIFICIAL TEAR OP Place 2 drops into both eyes daily.   Yes [provider]  aspirin 81 MG tablet Take 81 mg by mouth daily.   Yes [provider]  Calcium Carbonate-Vitamin D (CALTRATE 600+D PO) Take 1 tablet by mouth 2 (two) times daily.  Yes [provider]  cetirizine (ZYRTEC) 10 MG tablet Take 10 mg by mouth daily.   Yes [provider]  fenofibrate (TRICOR) 48 MG tablet Take 48 mg by mouth every morning.    Yes [provider]  fluticasone (FLONASE) 50 MCG/ACT nasal spray Place 2 sprays into both nostrils daily as needed for allergies.  08/04/12  Yes [provider]  glycopyrrolate (ROBINUL) 1 MG tablet Take 1 tablet (1 mg total) by mouth 2 (two) times daily. Patient taking differently: Take 1 mg by mouth 2 (two) times  daily as needed (for stomach).  10/24/16  Yes Esterwood, Amy S, PA-C  Multiple Vitamin (MULTIVITAMIN) tablet Take 1 tablet by mouth daily.   Yes [provider]  Omega-3 Fatty Acids (EQL OMEGA 3 FISH OIL) 1400 MG CAPS Take 1,400 mg by mouth daily.   Yes [provider]  Probiotic Product ([place]) Take 1 capsule by mouth daily.   Yes [provider]  propranolol (INDERAL) 10 MG tablet Take 10 mg by mouth 2 (two) times daily.    Yes [provider]  triamcinolone ointment (KENALOG) 0.1 % Apply 1 application topically daily as needed (for rash).  06/26/15  Yes [provider]  VITAMIN D, CHOLECALCIFEROL, PO Take 3 capsules by mouth at bedtime.   Yes [provider]    Physical Examination: General appearance - alert, well appearing, and in no distress Mental status - alert, oriented to person, place, and time Chest - clear to auscultation, no wheezes, rales or rhonchi, symmetric air entry Heart - normal rate, regular rhythm, normal S1, S2, no murmurs, rubs, clicks or gallops Abdomen - soft, nontender, nondistended, no masses or organomegaly Neurological - alert, oriented, normal speech, no focal findings or movement disorder noted  A left hip exam was performed. GENERAL: no acute distress SKIN: intact SWELLING: none WARMTH: no warmth TENDERNESS: maximal at greater trochanter ROM: normal STRENGTH: 4/5 hip abduction otherwise normal GAIT: antalgic  ASSESSMENT: Left hip gluteus medius tear  Plan : Left hip bursectomy and gluteal tendon repair. Discussed procedure, risks and potential complications and she elects to proceed  Dione Plover. Mitsugi Schrader, MD    03/26/2017, 9:06 AM

## 2017-03-26 NOTE — Anesthesia Preprocedure Evaluation (Signed)
Anesthesia Evaluation  Patient identified by MRN, date of birth, ID band Patient awake    Reviewed: Allergy & Precautions, NPO status , Patient's Chart, lab work & pertinent test results  Airway Mallampati: II  TM Distance: >3 FB Neck ROM: Full    Dental no notable dental hx.    Pulmonary neg pulmonary ROS, former smoker,    Pulmonary exam normal breath sounds clear to auscultation       Cardiovascular negative cardio ROS Normal cardiovascular exam Rhythm:Regular Rate:Normal     Neuro/Psych CVA, No Residual Symptoms negative psych ROS   GI/Hepatic negative GI ROS, Neg liver ROS,   Endo/Other  negative endocrine ROS  Renal/GU negative Renal ROS  negative genitourinary   Musculoskeletal negative musculoskeletal ROS (+)   Abdominal   Peds negative pediatric ROS (+)  Hematology negative hematology ROS (+)   Anesthesia Other Findings   Reproductive/Obstetrics negative OB ROS                             Anesthesia Physical Anesthesia Plan  ASA: II  Anesthesia Plan: Spinal   Post-op Pain Management:    Induction: Intravenous  PONV Risk Score and Plan:   Airway Management Planned: Simple Face Mask  Additional Equipment:   Intra-op Plan:   Post-operative Plan:   Informed Consent: I have reviewed the patients History and Physical, chart, labs and discussed the procedure including the risks, benefits and alternatives for the proposed anesthesia with the patient or authorized representative who has indicated his/her understanding and acceptance.   Dental advisory given  Plan Discussed with: CRNA and Surgeon  Anesthesia Plan Comments:         Anesthesia Quick Evaluation

## 2017-03-27 ENCOUNTER — Encounter (HOSPITAL_COMMUNITY): Payer: Self-pay | Admitting: Orthopedic Surgery

## 2017-03-27 DIAGNOSIS — M170 Bilateral primary osteoarthritis of knee: Secondary | ICD-10-CM | POA: Diagnosis not present

## 2017-03-27 DIAGNOSIS — M7062 Trochanteric bursitis, left hip: Secondary | ICD-10-CM | POA: Diagnosis not present

## 2017-03-27 DIAGNOSIS — S76012A Strain of muscle, fascia and tendon of left hip, initial encounter: Secondary | ICD-10-CM | POA: Diagnosis not present

## 2017-03-27 DIAGNOSIS — G250 Essential tremor: Secondary | ICD-10-CM | POA: Diagnosis not present

## 2017-03-27 DIAGNOSIS — K219 Gastro-esophageal reflux disease without esophagitis: Secondary | ICD-10-CM | POA: Diagnosis not present

## 2017-03-27 DIAGNOSIS — M19042 Primary osteoarthritis, left hand: Secondary | ICD-10-CM | POA: Diagnosis not present

## 2017-03-27 MED ORDER — TRAMADOL HCL 50 MG PO TABS: 50.0000 mg | ORAL_TABLET | Freq: Four times a day (QID) | ORAL | 0 refills | Status: DC | PRN

## 2017-03-27 MED ORDER — OXYCODONE HCL 5 MG PO TABS: 5.0000 mg | ORAL_TABLET | ORAL | 0 refills | Status: DC | PRN

## 2017-03-27 MED ORDER — METHOCARBAMOL 500 MG PO TABS: 500.0000 mg | ORAL_TABLET | Freq: Four times a day (QID) | ORAL | 0 refills | Status: DC | PRN

## 2017-03-27 NOTE — Evaluation (Signed)
Physical Therapy One Time Evaluation Patient Details Name: Erica Alvarez MRN: [id number] DOB: [birth year] Today's Date: 03/27/2017   History of Present Illness  Pt is a 72 year old female s/p Left hip bursectomy with gluteal tendon repair  Clinical Impression  Patient evaluated by Physical Therapy with no further acute PT needs identified. All education has been completed and the patient has no further questions. Pt performed ambulation and practiced one step.  Pt also educated on ways to avoid performing active L hip abduction during physical activity tasks.  Pt reports d/c home today. See below for any follow-up Physical Therapy or equipment needs. PT is signing off. Thank you for this referral.     Follow Up Recommendations No PT follow up    Equipment Recommendations  None recommended by PT    Recommendations for Other Services       Precautions / Restrictions Precautions Precautions: Other (comment) Precaution Comments: No active abduction L LE Restrictions Other Position/Activity Restrictions: WBAT      Mobility  Bed Mobility Overal bed mobility: Needs Assistance Bed Mobility: Supine to Sit     Supine to sit: Min guard     General bed mobility comments: entered on Left side of bed, discussed no active hip abduction in regards to pt's bed at home, pt aware she may need assist for L LE to decrease chance of actively performing hip abduction  Transfers Overall transfer level: Needs assistance Equipment used: Rolling walker (2 wheeled) Transfers: Sit to/from Stand Sit to Stand: Min guard;Supervision         General transfer comment: pt ambulating out of bathroom on arrival to room however able to return to sitting on bed without physical assist  Ambulation/Gait Ambulation/Gait assistance: Min guard;Supervision Ambulation Distance (Feet): 120 Feet Assistive device: Rolling walker (2 wheeled) Gait Pattern/deviations: Step-to pattern;Decreased stance time -  left;Antalgic Gait velocity: decr   General Gait Details: verbal cues for RW positioning and short steps (due to pain with longer steps), reviewed turning and avoiding active hip abduction during mobility  Stairs Stairs: Yes Stairs assistance: Min guard Stair Management: Step to pattern;Forwards;With walker Number of Stairs: 1 General stair comments: verbal cues for sequence and safety, pt performed well  Wheelchair Mobility    Modified Rankin (Stroke Patients Only)       Balance                                             Pertinent Vitals/Pain Pain Assessment: 0-10 Pain Score: 4  Pain Location: L posterior gluteal region Pain Descriptors / Indicators: Sore Pain Intervention(s): Limited activity within patient's tolerance;Repositioned;Monitored during session    Home Living Family/patient expects to be discharged to:: Private residence Living Arrangements: Spouse/significant other   Type of Home: House Home Access: Stairs to enter Entrance Stairs-Rails: None Entrance Stairs-Number of Steps: 1 Home Layout: Able to live on main level with bedroom/bathroom Home Equipment: Walker - 2 wheels;Shower seat;Toilet riser      Prior Function Level of Independence: Independent               Hand Dominance        Extremity/Trunk Assessment        Lower Extremity Assessment Lower Extremity Assessment: LLE deficits/detail LLE Deficits / Details: anticipated post op weakness in L hip, maintained no active hip abduction, able to perform mobility without assist  Communication   Communication: No difficulties  Cognition Arousal/Alertness: Awake/alert Behavior During Therapy: WFL for tasks assessed/performed Overall Cognitive Status: Within Functional Limits for tasks assessed                                        General Comments      Exercises     Assessment/Plan    PT Assessment Patent does not need any further  PT services  PT Problem List Decreased strength;Decreased mobility;Decreased knowledge of use of DME;Pain;Decreased knowledge of precautions       PT Treatment Interventions      PT Goals (Current goals can be found in the Care Plan section)  Acute Rehab PT Goals PT Goal Formulation: All assessment and education complete, DC therapy    Frequency     Barriers to discharge        Co-evaluation               AM-PAC PT "6 Clicks" Daily Activity  Outcome Measure Difficulty turning over in bed (including adjusting bedclothes, sheets and blankets)?: None Difficulty moving from lying on back to sitting on the side of the bed? : A Lot Difficulty sitting down on and standing up from a chair with arms (Alvarez.g., wheelchair, bedside commode, etc,.)?: A Lot Help needed moving to and from a bed to chair (including a wheelchair)?: A Little Help needed walking in hospital room?: A Little Help needed climbing 3-5 steps with a railing? : A Little 6 Click Score: 17    End of Session   Activity Tolerance: Patient tolerated treatment well Patient left: in bed;with call bell/phone within reach;with family/visitor present Nurse Communication: Mobility status PT Visit Diagnosis: Other abnormalities of gait and mobility (R26.89)    Time: 6606-0045 PT Time Calculation (min) (ACUTE ONLY): 19 min   Charges:   PT Evaluation $PT Eval Low Complexity: 1 Low     PT G CodesCarmelia Alvarez, PT, DPT 03/27/2017 Pager: [phone]  Erica Alvarez 03/27/2017, 2:53 PM

## 2017-03-27 NOTE — Discharge Summary (Signed)
Physician Discharge Summary   Patient ID: Erica Alvarez MRN: [id number] DOB/AGE: [birth year] 72 y.o.  Admit date: 03/26/2017 Discharge date: 03-27-2017  Primary Diagnosis:  Left hip gluteal tendon tear with intractable bursitis.   Admission Diagnoses:  Past Medical History:  Diagnosis Date  . Acute sinus infection    03-14-2014 current taking Z-Pak-- no fever but non-productive cough  . Benign essential tremor    takes inderal  . Diverticulitis   . Family history of adverse reaction to anesthesia    "sister had episode hypotension"  . History of basal cell carcinoma excision    nose  . History of colon polyps   . History of CVA (cerebrovascular accident) without residual deficits    17-00-1749  embolic right frontal MCA -- found to have PFO and Atrial Septum aneursym /  s/p  closure of PFO  . History of DVT of lower extremity    2003  . History of gastroesophageal reflux (GERD)   . OA (osteoarthritis)    knees and hands  . Patellar clunk syndrome of left knee   . S/P patent foramen ovale closure dx post cva w/ PFO with atrial septal aneursym   07-01-2005  via Intracardiac echo probe with deployment of CardioSEAL septal occluder  . Tendon tear    left gluteal tendon    Discharge Diagnoses:   Principal Problem:   Trochanteric bursitis of left hip Active Problems:   Trochanteric bursitis, left hip  Estimated body mass index is 33.2 kg/m as calculated from the following:   Height as of this encounter: 5' (1.524 m).   Weight as of this encounter: 77.1 kg (170 lb).  Procedure(s) (LRB): OPEN SURGICAL REPAIR OF LEFT GLUTEAL TENDON (Left)   Consults: None  HPI: Erica Alvarez is a 72 year old female with intractable pain along the lateral aspect of her left hip.  She has had cortisone injection and exercise without benefit.  An MRI showed a gluteal tendon tear in the gluteus medius.  She presents now for bursectomy and tendon repair.   Laboratory Data: Hospital  Outpatient Visit on 03/19/2017  Component Date Value Ref Range Status  . Sodium 03/19/2017 138  135 - 145 mmol/L Final  . Potassium 03/19/2017 4.1  3.5 - 5.1 mmol/L Final  . Chloride 03/19/2017 105  101 - 111 mmol/L Final  . CO2 03/19/2017 27  22 - 32 mmol/L Final  . Glucose, Bld 03/19/2017 98  65 - 99 mg/dL Final  . BUN 03/19/2017 14  6 - 20 mg/dL Final  . Creatinine, Ser 03/19/2017 0.77  0.44 - 1.00 mg/dL Final  . Calcium 03/19/2017 9.3  8.9 - 10.3 mg/dL Final  . GFR calc non Af Amer 03/19/2017 >60  >60 mL/min Final  . GFR calc Af Amer 03/19/2017 >60  >60 mL/min Final   Comment: (NOTE) The eGFR has been calculated using the CKD EPI equation. This calculation has not been validated in all clinical situations. eGFR's persistently <60 mL/min signify possible Chronic Kidney Disease.   . Anion gap 03/19/2017 6  5 - 15 Final  . WBC 03/19/2017 11.2* 4.0 - 10.5 K/uL Final  . RBC 03/19/2017 4.18  3.87 - 5.11 MIL/uL Final  . Hemoglobin 03/19/2017 11.7* 12.0 - 15.0 g/dL Final  . HCT 03/19/2017 37.6  36.0 - 46.0 % Final  . MCV 03/19/2017 90.0  78.0 - 100.0 fL Final  . MCH 03/19/2017 28.0  26.0 - 34.0 pg Final  . MCHC 03/19/2017 31.1  30.0 - 36.0 g/dL  Final  . RDW 03/19/2017 14.4  11.5 - 15.5 % Final  . Platelets 03/19/2017 324  150 - 400 K/uL Final     X-Rays:No results found.  EKG: Orders placed or performed during the hospital encounter of 03/19/17  . EKG 12 lead  . EKG 12 lead     Hospital Course: Patient was admitted to [facility] and taken to the OR and underwent the above state procedure without complications.  Patient tolerated the procedure well and was later transferred to the recovery room and then to the orthopaedic floor for postoperative care.  They were given PO and IV analgesics for pain control following their surgery.  They were given 24 hours of postoperative antibiotics of  Anti-infectives (From admission, onward)   Start     Dose/Rate Route Frequency  Ordered Stop   03/27/17 0600  vancomycin (VANCOCIN) IVPB 1000 mg/200 mL premix     1,000 mg 200 mL/hr over 60 Minutes Intravenous On call to O.R. 03/26/17 1343 03/26/17 1621   03/27/17 0400  vancomycin (VANCOCIN) IVPB 1000 mg/200 mL premix     1,000 mg 200 mL/hr over 60 Minutes Intravenous Every 12 hours 03/26/17 1814 03/27/17 0609     and started on DVT prophylaxis in the form of Lovenox. Patient was encouraged to get up and ambulate the day after surgery.  The patient was allowed to be WBAT with therapy. Discharge planning was consulted to help with postop disposition and equipment needs.  Patient had a good night on the evening of surgery.  They started to get up OOB with therapy on day one.   Patient was seen in rounds and was ready to go home following morning ambulation.   Diet - Cardiac diet Follow up - in two weeks. Call office for appointment at [phone]. Activity - Weight bearing as tolerated to the surgical leg.  No active abduction of the leg, (no pulling leg out to the side away from the body).  Walker for first several days until comfortable ambulating. May start showering three days following surgery but do not submerge incision under water. Continue to use ice for pain and swelling from surgery.  Baby Aspirin 81 mg daily for three weeks.  Please use walker for the first couple of days until comfortable ambulating.  May start changing dressing tomorrow with dry gauze and tape.  Disposition - Home Condition Upon Discharge - Stable D/C Meds - See DC Summary DVT Prophylaxis - Aspirin 81 mg daily for three weeks    Discharge Instructions    Call MD / Call 911   Complete by:  As directed    If you experience chest pain or shortness of breath, CALL 911 and be transported to the hospital emergency room.  If you develope a fever above 101 F, pus (white drainage) or increased drainage or redness at the wound, or calf pain, call your surgeon's office.   Change dressing   Complete  by:  As directed    You may change your dressing dressing daily with sterile 4 x 4 inch gauze dressing and paper tape.  Do not submerge the incision under water.   Constipation Prevention   Complete by:  As directed    Drink plenty of fluids.  Prune juice may be helpful.  You may use a stool softener, such as Colace (over the counter) 100 mg twice a day.  Use MiraLax (over the counter) for constipation as needed.   Diet - low sodium heart healthy  Complete by:  As directed    Discharge instructions   Complete by:  As directed    Resume the baby 81 mg Aspirin daily at home.  Pick up stool softner and laxative for home use following surgery while on pain medications. Do not submerge incision under water. Please use good hand washing techniques while changing dressing each day. May shower starting three days after surgery. Please use a clean towel to pat the incision dry following showers. Continue to use ice for pain and swelling after surgery. Do not use any lotions or creams on the incision until instructed by your surgeon.  Wear both TED hose on both legs during the day every day for three weeks, but may remove the TED hose at night at home.  Postoperative Constipation Protocol  Constipation - defined medically as fewer than three stools per week and severe constipation as less than one stool per week.  One of the most common issues patients have following surgery is constipation.  Even if you have a regular bowel pattern at home, your normal regimen is likely to be disrupted due to multiple reasons following surgery.  Combination of anesthesia, postoperative narcotics, change in appetite and fluid intake all can affect your bowels.  In order to avoid complications following surgery, here are some recommendations in order to help you during your recovery period.  Colace (docusate) - Pick up an over-the-counter form of Colace or another stool softener and take twice a day as long as you  are requiring postoperative pain medications.  Take with a full glass of water daily.  If you experience loose stools or diarrhea, hold the colace until you stool forms back up.  If your symptoms do not get better within 1 week or if they get worse, check with your doctor.  Dulcolax (bisacodyl) - Pick up over-the-counter and take as directed by the product packaging as needed to assist with the movement of your bowels.  Take with a full glass of water.  Use this product as needed if not relieved by Colace only.   MiraLax (polyethylene glycol) - Pick up over-the-counter to have on hand.  MiraLax is a solution that will increase the amount of water in your bowels to assist with bowel movements.  Take as directed and can mix with a glass of water, juice, soda, coffee, or tea.  Take if you go more than two days without a movement. Do not use MiraLax more than once per day. Call your doctor if you are still constipated or irregular after using this medication for 7 days in a row.  If you continue to have problems with postoperative constipation, please contact the office for further assistance and recommendations.  If you experience "the worst abdominal pain ever" or develop nausea or vomiting, please contact the office immediatly for further recommendations for treatment.   Do not sit on low chairs, stoools or toilet seats, as it may be difficult to get up from low surfaces   Complete by:  As directed    Driving restrictions   Complete by:  As directed    No driving until released by the physician.   Increase activity slowly as tolerated   Complete by:  As directed    Lifting restrictions   Complete by:  As directed    No lifting until released by the physician.   Patient may shower   Complete by:  As directed    You may shower without a dressing once there  is no drainage.  Do not wash over the wound.  If drainage remains, do not shower until drainage stops.   TED hose   Complete by:  As directed      Use stockings (TED hose) for 3 weeks on both leg(s).  You may remove them at night for sleeping.   Weight bearing as tolerated   Complete by:  As directed    Laterality:  left   Extremity:  Lower     Allergies as of 03/27/2017      Reactions   Medrol [methylprednisolone] Other (See Comments)   Disorientation/ hot flashes   Penicillins Itching, Nausea And Vomiting, Other (See Comments)   Has patient had a PCN reaction causing immediate rash, facial/tongue/throat swelling, SOB or lightheadedness with hypotension: No Has patient had a PCN reaction causing severe rash involving mucus membranes or skin necrosis: No Has patient had a PCN reaction that required hospitalization: No Has patient had a PCN reaction occurring within the last 10 years: No If all of the above answers are "NO", then may proceed with Cephalosporin use.   Sulfa Antibiotics Nausea And Vomiting   Sulfasalazine Nausea And Vomiting   Tetracycline Itching, Nausea And Vomiting   Hydrocodone Itching      Medication List    TAKE these medications   acetaminophen 500 MG tablet Commonly known as:  TYLENOL Take 1,000 mg by mouth every 6 (six) hours as needed for moderate pain or headache.   ARTIFICIAL TEAR OP Place 2 drops into both eyes daily.   aspirin 81 MG tablet Take 81 mg by mouth daily.   CALTRATE 600+D PO Take 1 tablet by mouth 2 (two) times daily.   cetirizine 10 MG tablet Commonly known as:  ZYRTEC Take 10 mg by mouth daily.   EQL OMEGA 3 FISH OIL 1400 MG Caps Take 1,400 mg by mouth daily.   fenofibrate 48 MG tablet Commonly known as:  TRICOR Take 48 mg by mouth every morning.   fluticasone 50 MCG/ACT nasal spray Commonly known as:  FLONASE Place 2 sprays into both nostrils daily as needed for allergies.   glycopyrrolate 1 MG tablet Commonly known as:  ROBINUL Take 1 tablet (1 mg total) by mouth 2 (two) times daily. What changed:    when to take this  reasons to take this    methocarbamol 500 MG tablet Commonly known as:  ROBAXIN Take 1 tablet (500 mg total) by mouth every 6 (six) hours as needed for muscle spasms.   multivitamin tablet Take 1 tablet by mouth daily.   oxyCODONE 5 MG immediate release tablet Commonly known as:  Oxy IR/ROXICODONE Take 1-2 tablets (5-10 mg total) by mouth every 4 (four) hours as needed for moderate pain or severe pain.   PHILLIPS COLON HEALTH PO Take 1 capsule by mouth daily.   propranolol 10 MG tablet Commonly known as:  INDERAL Take 10 mg by mouth 2 (two) times daily.   traMADol 50 MG tablet Commonly known as:  ULTRAM Take 1-2 tablets (50-100 mg total) by mouth every 6 (six) hours as needed (mild pain).   triamcinolone ointment 0.1 % Commonly known as:  KENALOG Apply 1 application topically daily as needed (for rash).   VITAMIN D (CHOLECALCIFEROL) PO Take 3 capsules by mouth at bedtime.            Discharge Care Instructions  (From admission, onward)        Start     Ordered   03/27/17 0000  Weight bearing as tolerated    Question Answer Comment  Laterality left   Extremity Lower      03/27/17 0937   03/27/17 0000  Change dressing    Comments:  You may change your dressing dressing daily with sterile 4 x 4 inch gauze dressing and paper tape.  Do not submerge the incision under water.   03/27/17 4159     Follow-up Information    Gaynelle Arabian, MD. Schedule an appointment as soon as possible for a visit on 04/08/2017.   Specialty:  Orthopedic Surgery Contact information: [address] [place] [address] [phone]           Signed: Arlee Muslim, PA-C Orthopaedic Surgery 03/27/2017, 9:39 AM

## 2017-03-27 NOTE — Progress Notes (Signed)
Subjective: 1 Day Post-Op Procedure(s) (LRB): OPEN SURGICAL REPAIR OF LEFT GLUTEAL TENDON (Left) Patient reports pain as mild.   Patient seen in rounds with Dr. Wynelle Link. Patient is well, but has had some minor complaints of pain in the hip, requiring pain medications Patient is ready to go home  Objective: Vital signs in last 24 hours: Temp:  [97.5 F (36.4 C)-98.1 F (36.7 C)] 98.1 F (36.7 C) (01/24 0502) Pulse Rate:  [62-86] 77 (01/24 0502) Resp:  [11-18] 16 (01/24 0502) BP: (116-159)/(58-96) 134/79 (01/24 0502) SpO2:  [94 %-98 %] 94 % (01/24 0502) Weight:  [77.1 kg (170 lb)] 77.1 kg (170 lb) (01/23 1358)  Intake/Output from previous day:  Intake/Output Summary (Last 24 hours) at 03/27/2017 0932 Last data filed at 03/27/2017 0750 Gross per 24 hour  Intake 3032.5 ml  Output 1325 ml  Net 1707.5 ml    Intake/Output this shift: Total I/O In: -  Out: 25 [Urine:25]  EXAM: General - Patient is Alert, Appropriate and Oriented Extremity - Neurovascular intact Sensation intact distally Intact pulses distally Dorsiflexion/Plantar flexion intact Dressing - clean, dry, no drainage Motor Function - intact, moving foot and toes well on exam.   Assessment/Plan: 1 Day Post-Op Procedure(s) (LRB): OPEN SURGICAL REPAIR OF LEFT GLUTEAL TENDON (Left) Procedure(s) (LRB): OPEN SURGICAL REPAIR OF LEFT GLUTEAL TENDON (Left) Past Medical History:  Diagnosis Date  . Acute sinus infection    03-14-2014 current taking Z-Pak-- no fever but non-productive cough  . Benign essential tremor    takes inderal  . Diverticulitis   . Family history of adverse reaction to anesthesia    "sister had episode hypotension"  . History of basal cell carcinoma excision    nose  . History of colon polyps   . History of CVA (cerebrovascular accident) without residual deficits    27-74-1287  embolic right frontal MCA -- found to have PFO and Atrial Septum aneursym /  s/p  closure of PFO  . History of  DVT of lower extremity    2003  . History of gastroesophageal reflux (GERD)   . OA (osteoarthritis)    knees and hands  . Patellar clunk syndrome of left knee   . S/P patent foramen ovale closure dx post cva w/ PFO with atrial septal aneursym   07-01-2005  via Intracardiac echo probe with deployment of CardioSEAL septal occluder  . Tendon tear    left gluteal tendon    Principal Problem:   Trochanteric bursitis of left hip Active Problems:   Trochanteric bursitis, left hip  Estimated body mass index is 33.2 kg/m as calculated from the following:   Height as of this encounter: 5' (1.524 m).   Weight as of this encounter: 77.1 kg (170 lb). Up with therapy Diet - Cardiac diet Follow up - in two weeks. Call office for appointment at [phone]. Activity - Weight bearing as tolerated to the surgical leg.  No active abduction of the leg, (no pulling leg out to the side away from the body).  Walker for first several days until comfortable ambulating. May start showering three days following surgery but do not submerge incision under water. Continue to use ice for pain and swelling from surgery.  Baby Aspirin 81 mg daily for three weeks.  Please use walker for the first couple of days until comfortable ambulating.  May start changing dressing tomorrow with dry gauze and tape.  Disposition - Home Condition Upon Discharge - Stable D/C Meds - See DC Summary DVT  Prophylaxis - Aspirin 81 mg daily for three weeks  Arlee Muslim, PA-C Orthopaedic Surgery 03/27/2017, 9:32 AM

## 2017-03-27 NOTE — Discharge Instructions (Signed)
° °Dr. Frank Aluisio °[facility] °[facility] °[address] °[place], [place] [address] °[phone] ° °Bursectomy / Gluteal Tendon Repair Discharge Instructions ° °HOME CARE INSTRUCTIONS  °Remove items at home which could result in a fall. This includes throw rugs or furniture in walking pathways.  °· ICE to the affected hip every three hours for 30 minutes at a time and then as needed for pain and swelling.  Continue to use ice on the hip for pain and swelling from surgery. You may notice swelling that will progress down to the foot and ankle.  This is normal after surgery.  Elevate the leg when you are not up walking on it.   °· Continue to use the breathing machine which will help keep your temperature down.  It is common for your temperature to cycle up and down following surgery, especially at night when you are not up moving around and exerting yourself.  The breathing machine keeps your lungs expanded and your temperature down. °Sit on high chairs which makes it easier to stand.  °Sit on chairs with arms. Use the chair arms to help push yourself up when arising.  °· No Active Abduction of the leg (No pulling leg out to the side away from the body). °· Use your walker for first several days until comfortable ambulating. ° °DIET °You may resume your previous home diet once your are discharged from the hospital. ° °DRESSING / WOUND CARE / SHOWERING °You may shower 3 days after surgery, but keep the wounds dry during showering.  You may use an occlusive plastic wrap (Press'n Seal for example), NO SOAKING/SUBMERGING IN THE BATHTUB.  If the bandage gets wet, change with a clean dry gauze.  If the incision gets wet, pat the wound dry with a clean towel. °You may start showering three days following surgery but do not submerge the incision under water.Just pat the incision dry and apply a dry gauze dressing on daily. °Change the surgical dressing daily and reapply a dry dressing each  time. ° °ACTIVITY °Walk with your walker as instructed. °Use walker as long as suggested by your caregivers. °Avoid periods of inactivity such as sitting longer than an hour when not asleep. This helps prevent blood clots.  °You may resume a sexual relationship in one month or when given the OK by your doctor.  °You may return to work once you are cleared by your doctor.  °Do not drive a car for 6 weeks or until released by you surgeon.  °Do not drive while taking narcotics. ° °WEIGHT BEARING °Weight bearing as tolerated with assist device (walker, cane, etc) as directed, use it as long as suggested by your surgeon or therapist, typically at least 4-6 weeks. ° °POSTOPERATIVE CONSTIPATION PROTOCOL °Constipation - defined medically as fewer than three stools per week and severe constipation as less than one stool per week. ° °One of the most common issues patients have following surgery is constipation.  Even if you have a regular bowel pattern at home, your normal regimen is likely to be disrupted due to multiple reasons following surgery.  Combination of anesthesia, postoperative narcotics, change in appetite and fluid intake all can affect your bowels.  In order to avoid complications following surgery, here are some recommendations in order to help you during your recovery period. ° °Colace (docusate) - Pick up an over-the-counter form of Colace or another stool softener and take twice a day as long as you are requiring   postoperative pain medications.  Take with a full glass of water daily.  If you experience loose stools or diarrhea, hold the colace until you stool forms back up.  If your symptoms do not get better within 1 week or if they get worse, check with your doctor.  Dulcolax (bisacodyl) - Pick up over-the-counter and take as directed by the product packaging as needed to assist with the movement of your bowels.  Take with a full glass of water.  Use this product as needed if not relieved by Colace  only.   MiraLax (polyethylene glycol) - Pick up over-the-counter to have on hand.  MiraLax is a solution that will increase the amount of water in your bowels to assist with bowel movements.  Take as directed and can mix with a glass of water, juice, soda, coffee, or tea.  Take if you go more than two days without a movement. Do not use MiraLax more than once per day. Call your doctor if you are still constipated or irregular after using this medication for 7 days in a row.  If you continue to have problems with postoperative constipation, please contact the office for further assistance and recommendations.  If you experience "the worst abdominal pain ever" or develop nausea or vomiting, please contact the office immediatly for further recommendations for treatment.  ITCHING  If you experience itching with your medications, try taking only a single pain pill, or even half a pain pill at a time.  You can also use Benadryl over the counter for itching or also to help with sleep.   TED HOSE STOCKINGS Wear the elastic stockings on both legs for three weeks following surgery during the day but you may remove then at night for sleeping.  MEDICATIONS See your medication summary on the After Visit Summary that the nursing staff will review with you prior to discharge.  You may have some home medications which will be placed on hold until you complete the course of blood thinner medication.  It is important for you to complete the blood thinner medication as prescribed by your surgeon.  Continue your approved medications as instructed at time of discharge.  PRECAUTIONS If you experience chest pain or shortness of breath - call 911 immediately for transfer to the hospital emergency department.  If you develop a fever greater that 101 F, purulent drainage from wound, increased redness or drainage from wound, foul odor from the wound/dressing, or calf pain - CONTACT YOUR SURGEON.                                                    FOLLOW-UP APPOINTMENTS Make sure you keep all of your appointments after your operation with your surgeon and caregivers. You should call the office at the above phone number and make an appointment for approximately two weeks after the date of your surgery or on the date instructed by your surgeon outlined in the "After Visit Summary".   Pick up stool softner and laxative for home use following surgery while on pain medications. Do not submerge incision under water. Please use good hand washing techniques while changing dressing each day. May shower starting three days after surgery. Please use a clean towel to pat the incision dry following showers. Continue to use ice for pain and swelling after surgery. Do not use  any lotions or creams on the incision until instructed by your surgeon.  Resume the baby 81 mg Aspirin daily at home.

## 2017-03-28 NOTE — Anesthesia Postprocedure Evaluation (Signed)
Anesthesia Post Note  Patient: Erica Alvarez  Procedure(s) Performed: OPEN SURGICAL REPAIR OF LEFT GLUTEAL TENDON (Left Hip)     Patient location during evaluation: PACU Anesthesia Type: Spinal Level of consciousness: awake and alert Pain management: pain level controlled Vital Signs Assessment: post-procedure vital signs reviewed and stable Respiratory status: spontaneous breathing, nonlabored ventilation, respiratory function stable and patient connected to nasal cannula oxygen Cardiovascular status: blood pressure returned to baseline and stable Postop Assessment: no apparent nausea or vomiting Anesthetic complications: no    Last Vitals:  Vitals:   03/27/17 0952 03/27/17 1023  BP: 127/66 (!) 142/78  Pulse: 78 74  Resp: 16   Temp: 36.6 C   SpO2: (!) 89%     Last Pain:  Vitals:   03/27/17 1230  TempSrc:   PainSc: 3                  Bryar Rennie S

## 2017-04-24 DIAGNOSIS — F4321 Adjustment disorder with depressed mood: Secondary | ICD-10-CM | POA: Diagnosis not present

## 2017-04-28 DIAGNOSIS — L821 Other seborrheic keratosis: Secondary | ICD-10-CM | POA: Diagnosis not present

## 2017-04-28 DIAGNOSIS — Z1283 Encounter for screening for malignant neoplasm of skin: Secondary | ICD-10-CM | POA: Diagnosis not present

## 2017-04-28 DIAGNOSIS — L820 Inflamed seborrheic keratosis: Secondary | ICD-10-CM | POA: Diagnosis not present

## 2017-04-30 DIAGNOSIS — F4321 Adjustment disorder with depressed mood: Secondary | ICD-10-CM | POA: Diagnosis not present

## 2017-05-05 DIAGNOSIS — Z1231 Encounter for screening mammogram for malignant neoplasm of breast: Secondary | ICD-10-CM | POA: Diagnosis not present

## 2017-05-08 DIAGNOSIS — F4321 Adjustment disorder with depressed mood: Secondary | ICD-10-CM | POA: Diagnosis not present

## 2017-05-15 DIAGNOSIS — F4321 Adjustment disorder with depressed mood: Secondary | ICD-10-CM | POA: Diagnosis not present

## 2017-05-21 ENCOUNTER — Ambulatory Visit: Payer: Medicare Other | Attending: Orthopedic Surgery | Admitting: Physical Therapy

## 2017-05-21 ENCOUNTER — Encounter: Payer: Self-pay | Admitting: Physical Therapy

## 2017-05-21 ENCOUNTER — Other Ambulatory Visit: Payer: Self-pay

## 2017-05-21 DIAGNOSIS — R262 Difficulty in walking, not elsewhere classified: Secondary | ICD-10-CM | POA: Insufficient documentation

## 2017-05-21 DIAGNOSIS — M25552 Pain in left hip: Secondary | ICD-10-CM | POA: Diagnosis not present

## 2017-05-21 NOTE — Therapy (Signed)
[address] [place], [place], [address] Phone: [phone]   Fax:  [phone]  Physical Therapy Evaluation  Patient Details  Name: Erica Alvarez MRN: [id number] Date of Birth: 04/06/1945 Referring Provider: Wynelle Link   Encounter Date: 05/21/2017  PT End of Session - 05/21/17 1343    Visit Number  1    Date for PT Re-Evaluation  06/21/17    PT Start Time  1308    PT Stop Time  1345    PT Time Calculation (min)  37 min    Activity Tolerance  Patient tolerated treatment well    Behavior During Therapy  [facility] for tasks assessed/performed       Past Medical History:  Diagnosis Date  . Acute sinus infection    03-14-2014 current taking Z-Pak-- no fever but non-productive cough  . Benign essential tremor    takes inderal  . Diverticulitis   . Family history of adverse reaction to anesthesia    "sister had episode hypotension"  . History of basal cell carcinoma excision    nose  . History of colon polyps   . History of CVA (cerebrovascular accident) without residual deficits    [id number]  embolic right frontal MCA -- found to have PFO and Atrial Septum aneursym /  s/p  closure of PFO  . History of DVT of lower extremity    2003  . History of gastroesophageal reflux (GERD)   . OA (osteoarthritis)    knees and hands  . Patellar clunk syndrome of left knee   . S/P patent foramen ovale closure dx post cva w/ PFO with atrial septal aneursym   07-01-2005  via Intracardiac echo probe with deployment of CardioSEAL septal occluder  . Tendon tear    left gluteal tendon     Past Surgical History:  Procedure Laterality Date  . ABDOMINAL HYSTERECTOMY  1982  . APPENDECTOMY  1960's  . EYE SURGERY  2018   cataract extraction with lens placement    . KNEE ARTHROSCOPY Bilateral left 02-21-2010/  right 11-04-2007   bilateral 2001  . KNEE ARTHROSCOPY Left 03/22/2015   Procedure: LEFT KNEE ARTHROSCOPY AND;   Surgeon: Gaynelle Arabian, MD;  Location: [facility];  Service: Orthopedics;  Laterality: Left;  . LAPAROSCOPIC CHOLECYSTECTOMY  1999  . OPEN SURGICAL REPAIR OF GLUTEAL TENDON Left 03/26/2017   Procedure: OPEN SURGICAL REPAIR OF LEFT GLUTEAL TENDON;  Surgeon: Gaynelle Arabian, MD;  Location: [facility];  Service: Orthopedics;  Laterality: Left;  . PATENT FORAMEN OVALE CLOSURE  07-01-2005  dr Einar Gip   via Intracardiac echocardiogram successful placement CardioSEAL septal occluder (PFO measured about 56mm)  . SYNOVECTOMY Left 03/22/2015   Procedure: SYNOVECTOMY;  Surgeon: Gaynelle Arabian, MD;  Location: [facility];  Service: Orthopedics;  Laterality: Left;  . TOENAIL TRIMMING    . TONSILLECTOMY  1960's  . TOTAL HIP ARTHROPLASTY Right 04/29/2012   Procedure: TOTAL HIP ARTHROPLASTY ANTERIOR APPROACH;  Surgeon: [name], MD;  Location: [facility];  Service: Orthopedics;  Laterality: Right;  . TOTAL KNEE ARTHROPLASTY  12/10/2011   Procedure: TOTAL KNEE ARTHROPLASTY;  Surgeon: [name], MD;  Location: [facility];  Service: Orthopedics;  Laterality: Left;  . TOTAL KNEE ARTHROPLASTY Right 08-08-2008    There were no vitals filed for this visit.   Subjective Assessment - 05/21/17 1308    Subjective  Patient reports that she underwent a left gluteal tendon repair on 03/26/17.  She reports that she had left hip pain for about a year.  An MRI showed the tear, and severe bursitis.  The surgeon also did a bursectomy and an ITB lengthening according to the patient.  She reports that she started some light water walking last week.      Limitations  Standing;Walking;House hold activities    Patient Stated Goals  have less pain, walk better    Currently in Pain?  Yes    Pain Score  2     Pain Location  Hip    Pain Orientation  Left    Pain Descriptors / Indicators  Aching    Pain Type  Acute pain;Surgical pain    Pain Onset  More than a month ago    Pain Frequency  Constant     Aggravating Factors   walking, standing, up to 6-7/10    Pain Relieving Factors  lie flat, some pain medication, pain can be 0/10    Effect of Pain on Daily Activities  reports difficulty walking, shopping, exercising and going up and down stairs         [facility] PT Assessment - 05/21/17 0001      Assessment   Medical Diagnosis  s/p left gluteal tendon repair and bursectomy    Referring Provider  Aluisio    Onset Date/Surgical Date  03/26/17    Prior Therapy  no      Precautions   Precaution Comments  be careful with stepping up      Restrictions   Weight Bearing Restrictions  No      Balance Screen   Has the patient fallen in the past 6 months  No    Has the patient had a decrease in activity level because of a fear of falling?   No    Is the patient reluctant to leave their home because of a fear of falling?   No      Home Environment   Additional Comments  has stairs, does housework      Prior Function   Level of Independence  Independent    Vocation  Retired    Garment/textile technologist 2x/week, gym 3x/week      Posture/Postural Control   Posture Comments  leans away from the left side      ROM / Strength   AROM / PROM / Strength  AROM;Strength      AROM   Overall AROM Comments  AROM of the left hip in standing:  flexion 50, abduction 15, extension 5 degrees      Strength   Overall Strength Comments  3+/5 sub maximal effort to protect, some pain with these      Flexibility   Soft Tissue Assessment /Muscle Length  yes    Hamstrings  very tight    Quadriceps  tight very tight in the groin    ITB  very tight and tender    Piriformis  very tight and tender      Palpation   Palpation comment  tender in the left GT and buttock area, very tender and tight in the       Ambulation/Gait   Gait Comments  no device today, reports was using cane until about 10 days ago.  She has a "wobble", trendelenberg on the left, the first few steps are more pronounced then gets a  little better, shortened stance phase on the left  Objective measurements completed on examination: See above findings.      [facility] Adult PT Treatment/Exercise - 05/21/17 0001      Exercises   Exercises  Knee/Hip      Knee/Hip Exercises: Aerobic   Nustep  level 2 x 5 minutes             PT Education - 05/21/17 1339    Education provided  Yes    Education Details  HEP for piriformis, quad, groing and Hs stretches    Person(s) Educated  Patient    Methods  Explanation;Demonstration;Handout    Comprehension  Verbalized understanding       PT Short Term Goals - 05/21/17 1349      PT SHORT TERM GOAL #1   Title  independnet with initial HEP    Time  2    Period  Weeks    Status  New        PT Long Term Goals - 05/21/17 1349      PT LONG TERM GOAL #1   Title  walk without difficulty to go shopping    Time  8    Period  Weeks    Status  New      PT LONG TERM GOAL #2   Title  go up and down stairs step over step    Time  8    Period  Weeks    Status  New      PT LONG TERM GOAL #3   Title  report no pain    Time  8    Period  Weeks    Status  New      PT LONG TERM GOAL #4   Title  return to gym and water without difficulty    Time  8    Period  Weeks    Status  New             Plan - 05/21/17 1345    Clinical Impression Statement  Patient reports that she had been having some left hip pain for about a year.  She reports undergoing left gluteal tendon repair, bursectomy and describes an ITB lengthening on 03/26/17.  She is very tight in the bilateral LE mms, she has a trendelenberg gait and shortened stance phase on the left.      History and Personal Factors relevant to plan of care:  CVA, bilateral TKR, right THR    Clinical Presentation  Evolving    Clinical Decision Making  Low    Rehab Potential  Good    PT Frequency  2x / week    PT Duration  4 weeks    PT Treatment/Interventions  ADLs/Self Care Home  Management;Cryotherapy;Electrical Stimulation;Moist Heat;Gait training;Stair training;Functional mobility training;Therapeutic activities;Therapeutic exercise;Balance training;Manual techniques;Patient/family education    PT Next Visit Plan  she is to work on HEP for flexibility, next visit very slowly start very easy exercises to get her moving walking better and very light strength, protect hip abductors    Consulted and Agree with Plan of Care  Patient       Patient will benefit from skilled therapeutic intervention in order to improve the following deficits and impairments:  Abnormal gait, Decreased range of motion, Difficulty walking, Increased fascial restricitons, Pain, Impaired flexibility, Decreased strength  Visit Diagnosis: Pain in left hip - Plan: PT plan of care cert/re-cert  Difficulty in walking, not elsewhere classified - Plan: PT plan of care cert/re-cert     Problem List  Patient Active Problem List   Diagnosis Date Noted  . Trochanteric bursitis of left hip 03/26/2017  . Trochanteric bursitis, left hip 03/26/2017  . 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 OA (osteoarthritis) of hip 04/29/2012  . Postop Acute blood loss anemia 12/12/2011  . Postop Hyponatremia 12/12/2011  . OA (osteoarthritis) of knee 12/10/2011  . ANXIETY STATE, UNSPECIFIED 05/13/2008  . ABDOMINAL PAIN-RUQ 05/13/2008  . DIARRHEA 04/15/2008  . CEREBROVASCULAR ACCIDENT, HX OF 10/23/2007  . HYPERLIPIDEMIA 10/22/2007  . HEMORRHOIDS, INTERNAL 10/22/2007  . EXTERNAL HEMORRHOIDS 10/22/2007  . GERD 10/22/2007  . DIVERTICULOSIS, COLON 10/22/2007  . IBS 10/22/2007  . OSTEOPOROSIS 10/22/2007  . TREMOR 10/22/2007  . COLONIC POLYPS, HX OF 10/22/2007    Sumner Boast., PT 05/21/2017, 1:53 PM  [address] [place], [place], [address] Phone: [phone]   Fax:  [phone]  Name: Erica Alvarez MRN: [id number] Date of  Birth: 18-Aug-1945

## 2017-05-22 DIAGNOSIS — F4321 Adjustment disorder with depressed mood: Secondary | ICD-10-CM | POA: Diagnosis not present

## 2017-05-27 DIAGNOSIS — F4321 Adjustment disorder with depressed mood: Secondary | ICD-10-CM | POA: Diagnosis not present

## 2017-05-28 ENCOUNTER — Encounter: Payer: Self-pay | Admitting: Physical Therapy

## 2017-05-28 ENCOUNTER — Ambulatory Visit: Payer: Medicare Other | Admitting: Physical Therapy

## 2017-05-28 DIAGNOSIS — M25552 Pain in left hip: Secondary | ICD-10-CM | POA: Diagnosis not present

## 2017-05-28 DIAGNOSIS — R262 Difficulty in walking, not elsewhere classified: Secondary | ICD-10-CM | POA: Diagnosis not present

## 2017-05-28 NOTE — Therapy (Signed)
[address] [place], [place], [address] Phone: [phone]   Fax:  [phone]  Physical Therapy Treatment  Patient Details  Name: Erica Alvarez MRN: [id number] Date of Birth: Jul 06, 1945 Referring Provider: Wynelle Link   Encounter Date: 05/28/2017  PT End of Session - 05/28/17 1427    Visit Number  2    Date for PT Re-Evaluation  06/21/17    PT Start Time  1345    PT Stop Time  1427    PT Time Calculation (min)  42 min    Activity Tolerance  Patient tolerated treatment well    Behavior During Therapy  [facility] for tasks assessed/performed       Past Medical History:  Diagnosis Date  . Acute sinus infection    03-14-2014 current taking Z-Pak-- no fever but non-productive cough  . Benign essential tremor    takes inderal  . Diverticulitis   . Family history of adverse reaction to anesthesia    "sister had episode hypotension"  . History of basal cell carcinoma excision    nose  . History of colon polyps   . History of CVA (cerebrovascular accident) without residual deficits    40-12-2723  embolic right frontal MCA -- found to have PFO and Atrial Septum aneursym /  s/p  closure of PFO  . History of DVT of lower extremity    2003  . History of gastroesophageal reflux (GERD)   . OA (osteoarthritis)    knees and hands  . Patellar clunk syndrome of left knee   . S/P patent foramen ovale closure dx post cva w/ PFO with atrial septal aneursym   07-01-2005  via Intracardiac echo probe with deployment of CardioSEAL septal occluder  . Tendon tear    left gluteal tendon     Past Surgical History:  Procedure Laterality Date  . ABDOMINAL HYSTERECTOMY  1982  . APPENDECTOMY  1960's  . EYE SURGERY  2018   cataract extraction with lens placement    . KNEE ARTHROSCOPY Bilateral left 02-21-2010/  right 11-04-2007   bilateral 2001  . KNEE ARTHROSCOPY Left 03/22/2015   Procedure: LEFT KNEE ARTHROSCOPY AND;   Surgeon: Gaynelle Arabian, MD;  Location[facility];  Service: Orthopedics;  Laterality: Left;  . LAPAROSCOPIC CHOLECYSTECTOMY  1999  . OPEN SURGICAL REPAIR OF GLUTEAL TENDON Left 03/26/2017   Procedure: OPEN SURGICAL REPAIR OF LEFT GLUTEAL TENDON;  Surgeon: Gaynelle Arabian, MD;  Location: [facility];  Service: Orthopedics;  Laterality: Left;  . PATENT FORAMEN OVALE CLOSURE  07-01-2005  dr Einar Gip   via Intracardiac echocardiogram successful placement CardioSEAL septal occluder (PFO measured about 34mm)  . SYNOVECTOMY Left 03/22/2015   Procedure: SYNOVECTOMY;  Surgeon: Gaynelle Arabian, MD;  Location: [facility];  Service: Orthopedics;  Laterality: Left;  . TOENAIL TRIMMING    . TONSILLECTOMY  1960's  . TOTAL HIP ARTHROPLASTY Right 04/29/2012   Procedure: TOTAL HIP ARTHROPLASTY ANTERIOR APPROACH;  Surgeon: [name], MD;  Location: [facility];  Service: Orthopedics;  Laterality: Right;  . TOTAL KNEE ARTHROPLASTY  12/10/2011   Procedure: TOTAL KNEE ARTHROPLASTY;  Surgeon: [name], MD;  Location: [facility];  Service: Orthopedics;  Laterality: Left;  . TOTAL KNEE ARTHROPLASTY Right 08-08-2008    There were no vitals filed for this visit.  Subjective Assessment - 05/28/17 1345    Subjective  "I am walking better, I have been concentrating on taking bigger steps"  Currently in Pain?  No/denies    Pain Score  0-No pain                No data recorded       [facility] Adult PT Treatment/Exercise - 05/28/17 0001      Knee/Hip Exercises: Stretches   Passive Hamstring Stretch  Left;3 reps;10 seconds      Knee/Hip Exercises: Aerobic   Nustep  level 3 x 6 minutes      Knee/Hip Exercises: Standing   Other Standing Knee Exercises  marching 2x10       Knee/Hip Exercises: Seated   Long Arc Quad  Both;2 sets;10 reps    Ball Squeeze  x10, x5    Hamstring Curl  2 sets;10 reps;Both    Sit to Sand  2 sets;5 reps;10 reps;without UE support      Knee/Hip  Exercises: Supine   Bridges  3 sets;5 reps;Both    Straight Leg Raises  Both;2 sets;5 reps      Manual Therapy   Manual Therapy  Passive ROM;Manual Traction    Passive ROM  L hip all directions.    Manual Traction  gentel L hip distraction x4               PT Short Term Goals - 05/28/17 1428      PT SHORT TERM GOAL #1   Title  independnet with initial HEP    Status  Achieved        PT Long Term Goals - 05/21/17 1349      PT LONG TERM GOAL #1   Title  walk without difficulty to go shopping    Time  8    Period  Weeks    Status  New      PT LONG TERM GOAL #2   Title  go up and down stairs step over step    Time  8    Period  Weeks    Status  New      PT LONG TERM GOAL #3   Title  report no pain    Time  8    Period  Weeks    Status  New      PT LONG TERM GOAL #4   Title  return to gym and water without difficulty    Time  8    Period  Weeks    Status  New            Plan - 05/28/17 1428    Clinical Impression Statement  Pt tolerated an initial progression to exercises well. Little compensation with sit to stand noted. Pt does report tolerable tenderness with standing marches. Mild knee pain with LAQ.    Rehab Potential  Good    PT Frequency  2x / week    PT Duration  4 weeks    PT Treatment/Interventions  ADLs/Self Care Home Management;Cryotherapy;Electrical Stimulation;Moist Heat;Gait training;Stair training;Functional mobility training;Therapeutic activities;Therapeutic exercise;Balance training;Manual techniques;Patient/family education    PT Next Visit Plan  she is to work on HEP for flexibility, next visit very slowly start very easy exercises to get her moving walking better and very light strength, protect hip abductors       Patient will benefit from skilled therapeutic intervention in order to improve the following deficits and impairments:  Abnormal gait, Decreased range of motion, Difficulty walking, Increased fascial restricitons, Pain,  Impaired flexibility, Decreased strength  Visit Diagnosis: Difficulty in walking, not elsewhere classified  Pain in  left hip     Problem List Patient Active Problem List   Diagnosis Date Noted  . Trochanteric bursitis of left hip 03/26/2017  . Trochanteric bursitis, left hip 03/26/2017  . 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 OA (osteoarthritis) of hip 04/29/2012  . Postop Acute blood loss anemia 12/12/2011  . Postop Hyponatremia 12/12/2011  . OA (osteoarthritis) of knee 12/10/2011  . ANXIETY STATE, UNSPECIFIED 05/13/2008  . ABDOMINAL PAIN-RUQ 05/13/2008  . DIARRHEA 04/15/2008  . CEREBROVASCULAR ACCIDENT, HX OF 10/23/2007  . HYPERLIPIDEMIA 10/22/2007  . HEMORRHOIDS, INTERNAL 10/22/2007  . EXTERNAL HEMORRHOIDS 10/22/2007  . GERD 10/22/2007  . DIVERTICULOSIS, COLON 10/22/2007  . IBS 10/22/2007  . OSTEOPOROSIS 10/22/2007  . TREMOR 10/22/2007  . COLONIC POLYPS, HX OF 10/22/2007    Scot Jun, PTA 05/28/2017, 2:31 PM  [address] [place], [place], [address] Phone: [phone]   Fax:  [phone]  Name: Erica Alvarez MRN: [id number] Date of Birth: [birth year]

## 2017-05-30 ENCOUNTER — Ambulatory Visit: Payer: Medicare Other | Admitting: Physical Therapy

## 2017-05-30 ENCOUNTER — Encounter: Payer: Self-pay | Admitting: Physical Therapy

## 2017-05-30 DIAGNOSIS — R262 Difficulty in walking, not elsewhere classified: Secondary | ICD-10-CM | POA: Diagnosis not present

## 2017-05-30 DIAGNOSIS — M25552 Pain in left hip: Secondary | ICD-10-CM | POA: Diagnosis not present

## 2017-05-30 NOTE — Therapy (Signed)
[address] [place], [place], [address] Phone: [phone]   Fax:  [phone]  Physical Therapy Treatment  Patient Details  Name: Erica Alvarez MRN: [id number] Date of Birth: 15-Jan-1946 Referring Provider: Wynelle Link   Encounter Date: 05/30/2017  PT End of Session - 05/30/17 1150    Visit Number  3    Date for PT Re-Evaluation  06/21/17    PT Start Time  66    PT Stop Time  1150    PT Time Calculation (min)  50 min    Activity Tolerance  Patient tolerated treatment well    Behavior During Therapy  [facility] for tasks assessed/performed       Past Medical History:  Diagnosis Date   Acute sinus infection    03-14-2014 current taking Z-Pak-- no fever but non-productive cough   Benign essential tremor    takes inderal   Diverticulitis    Family history of adverse reaction to anesthesia    "sister had episode hypotension"   History of basal cell carcinoma excision    nose   History of colon polyps    History of CVA (cerebrovascular accident) without residual deficits    25-85-2778  embolic right frontal MCA -- found to have PFO and Atrial Septum aneursym /  s/p  closure of PFO   History of DVT of lower extremity    2003   History of gastroesophageal reflux (GERD)    OA (osteoarthritis)    knees and hands   Patellar clunk syndrome of left knee    S/P patent foramen ovale closure dx post cva w/ PFO with atrial septal aneursym   07-01-2005  via Intracardiac echo probe with deployment of CardioSEAL septal occluder   Tendon tear    left gluteal tendon     Past Surgical History:  Procedure Laterality Date   ABDOMINAL HYSTERECTOMY  1982   APPENDECTOMY  1960's   EYE SURGERY  2018   cataract extraction with lens placement     KNEE ARTHROSCOPY Bilateral left 02-21-2010/  right 11-04-2007   bilateral 2001   KNEE ARTHROSCOPY Left 03/22/2015   Procedure: LEFT KNEE ARTHROSCOPY AND;   Surgeon: Gaynelle Arabian, MD;  Location: [facility];  Service: Orthopedics;  Laterality: Left;   LAPAROSCOPIC CHOLECYSTECTOMY  1999   OPEN SURGICAL REPAIR OF GLUTEAL TENDON Left 03/26/2017   Procedure: OPEN SURGICAL REPAIR OF LEFT GLUTEAL TENDON;  Surgeon: Gaynelle Arabian, MD;  [facility];  Service: Orthopedics;  Laterality: Left;   PATENT FORAMEN OVALE CLOSURE  07-01-2005  dr Einar Gip   via Intracardiac echocardiogram successful placement CardioSEAL septal occluder (PFO measured about 65mm)   SYNOVECTOMY Left 03/22/2015   Procedure: SYNOVECTOMY;  Surgeon: Gaynelle Arabian, MD;  Location: [facility];  Service: Orthopedics;  Laterality: Left;   TOENAIL TRIMMING     TONSILLECTOMY  1960's   TOTAL HIP ARTHROPLASTY Right 04/29/2012   Procedure: TOTAL HIP ARTHROPLASTY ANTERIOR APPROACH;  Surgeon: [name], MD;  Location[facility];  Service: Orthopedics;  Laterality: Right;   TOTAL KNEE ARTHROPLASTY  12/10/2011   Procedure: TOTAL KNEE ARTHROPLASTY;  Surgeon: [name], MD;  [facility];  Service: Orthopedics;  Laterality: Left;   TOTAL KNEE ARTHROPLASTY Right 08-08-2008    There were no vitals filed for this visit.  Subjective Assessment - 05/30/17 1149    Subjective  "Very muscle sore following last PT appointment"  felt better midway through the  next day.  Water aerobics with modifications                No data recorded       [facility] Adult PT Treatment/Exercise - 05/30/17 0001      Knee/Hip Exercises: Aerobic   Nustep  level5 x3minutes      Knee/Hip Exercises: Machines for Strengthening   Cybex Leg Press  20# 2x10      Knee/Hip Exercises: Standing   Hip Abduction  Both;2 sets;10 reps    Abduction Limitations  knee turnouts green band 2x10ea b    Other Standing Knee Exercises  single leg stance with opposite hip flexion isometric 10x5"hold    Other Standing Knee Exercises  3way hip yellow band 2x10ea b      Knee/Hip  Exercises: Seated   Clamshell with TheraBand  Green 2x10ea b    Sit to Sand  2 sets;10 reps      Knee/Hip Exercises: Supine   Bridges  Strengthening;2 sets;10 reps    Other Supine Knee/Hip Exercises  clamshell, green band 2x10ea b               PT Short Term Goals - 05/28/17 1428      PT SHORT TERM GOAL #1   Title  independnet with initial HEP    Status  Achieved        PT Long Term Goals - 05/21/17 1349      PT LONG TERM GOAL #1   Title  walk without difficulty to go shopping    Time  8    Period  Weeks    Status  New      PT LONG TERM GOAL #2   Title  go up and down stairs step over step    Time  8    Period  Weeks    Status  New      PT LONG TERM GOAL #3   Title  report no pain    Time  8    Period  Weeks    Status  New      PT LONG TERM GOAL #4   Title  return to gym and water without difficulty    Time  8    Period  Weeks    Status  New            Plan - 05/30/17 1152    PT Next Visit Plan  Cont to progress proximal LE strength and stability to improve hip abduction strength and decreased increased lateral hip sway during gait that is a likely contributor to her hip pain with activity.        Patient will benefit from skilled therapeutic intervention in order to improve the following deficits and impairments:     Visit Diagnosis: Difficulty in walking, not elsewhere classified  Pain in left hip     Problem List Patient Active Problem List   Diagnosis Date Noted   Trochanteric bursitis of left hip 03/26/2017   Trochanteric bursitis, left hip 03/26/2017   Patellar clunk syndrome 03/22/2015   OA (osteoarthritis) of hip 04/29/2012   Postop Acute blood loss anemia 12/12/2011   Postop Hyponatremia 12/12/2011   OA (osteoarthritis) of knee 12/10/2011   ANXIETY STATE, UNSPECIFIED 05/13/2008   ABDOMINAL PAIN-RUQ 05/13/2008   DIARRHEA 04/15/2008   CEREBROVASCULAR ACCIDENT, HX OF 10/23/2007   HYPERLIPIDEMIA 10/22/2007    HEMORRHOIDS, INTERNAL 10/22/2007   EXTERNAL HEMORRHOIDS 10/22/2007   GERD 10/22/2007   DIVERTICULOSIS, COLON 10/22/2007  IBS 10/22/2007   OSTEOPOROSIS 10/22/2007   TREMOR 10/22/2007   COLONIC POLYPS, HX OF 10/22/2007    Olean Ree, PTA 05/30/2017, 11:54 AM  [address] [place], [place], [address] Phone: [phone]   Fax:  [phone]  Name: Erica Alvarez MRN: [id number] Date of Birth: Jun 11, 1945

## 2017-06-02 ENCOUNTER — Ambulatory Visit: Payer: Medicare Other | Attending: Orthopedic Surgery | Admitting: Physical Therapy

## 2017-06-02 ENCOUNTER — Encounter: Payer: Self-pay | Admitting: Physical Therapy

## 2017-06-02 DIAGNOSIS — R262 Difficulty in walking, not elsewhere classified: Secondary | ICD-10-CM

## 2017-06-02 DIAGNOSIS — M25552 Pain in left hip: Secondary | ICD-10-CM | POA: Diagnosis not present

## 2017-06-02 NOTE — Therapy (Signed)
[address] [place], [place], [address] Phone: [phone]   Fax:  [phone]  Physical Therapy Treatment  Patient Details  Name: Erica Alvarez MRN: [id number] Date of Birth: Jul 08, 1945 Referring Provider: Wynelle Link   Encounter Date: 06/02/2017  PT End of Session - 06/02/17 1428    Visit Number  4    Date for PT Re-Evaluation  06/21/17    PT Start Time  1340    PT Stop Time  1428    PT Time Calculation (min)  48 min    Activity Tolerance  Patient tolerated treatment well    Behavior During Therapy  [facility] for tasks assessed/performed       Past Medical History:  Diagnosis Date  . Acute sinus infection    03-14-2014 current taking Z-Pak-- no fever but non-productive cough  . Benign essential tremor    takes inderal  . Diverticulitis   . Family history of adverse reaction to anesthesia    "sister had episode hypotension"  . History of basal cell carcinoma excision    nose  . History of colon polyps   . History of CVA (cerebrovascular accident) without residual deficits    [id number]  embolic right frontal MCA -- found to have PFO and Atrial Septum aneursym /  s/p  closure of PFO  . History of DVT of lower extremity    2003  . History of gastroesophageal reflux (GERD)   . OA (osteoarthritis)    knees and hands  . Patellar clunk syndrome of left knee   . S/P patent foramen ovale closure dx post cva w/ PFO with atrial septal aneursym   07-01-2005  via Intracardiac echo probe with deployment of CardioSEAL septal occluder  . Tendon tear    left gluteal tendon     Past Surgical History:  Procedure Laterality Date  . ABDOMINAL HYSTERECTOMY  1982  . APPENDECTOMY  1960's  . EYE SURGERY  2018   cataract extraction with lens placement    . KNEE ARTHROSCOPY Bilateral left 02-21-2010/  right 11-04-2007   bilateral 2001  . KNEE ARTHROSCOPY Left 03/22/2015   Procedure: LEFT KNEE ARTHROSCOPY AND;  Surgeon:  Gaynelle Arabian, MD;  Location: [facility]  Service: Orthopedics;  Laterality: Left;  . LAPAROSCOPIC CHOLECYSTECTOMY  1999  . OPEN SURGICAL REPAIR OF GLUTEAL TENDON Left 03/26/2017   Procedure: OPEN SURGICAL REPAIR OF LEFT GLUTEAL TENDON;  Surgeon: Gaynelle Arabian, MD;  Location: [facility];  Service: Orthopedics;  Laterality: Left;  . PATENT FORAMEN OVALE CLOSURE  07-01-2005  dr Einar Gip   via Intracardiac echocardiogram successful placement CardioSEAL septal occluder (PFO measured about 10mm)  . SYNOVECTOMY Left 03/22/2015   Procedure: SYNOVECTOMY;  Surgeon: Gaynelle Arabian, MD;  [facility]: [facility];  Service: Orthopedics;  Laterality: Left;  . TOENAIL TRIMMING    . TONSILLECTOMY  1960's  . TOTAL HIP ARTHROPLASTY Right 04/29/2012   Procedure: TOTAL HIP ARTHROPLASTY ANTERIOR APPROACH;  Surgeon: [name], MD;  Location: [facility];  Service: Orthopedics;  Laterality: Right;  . TOTAL KNEE ARTHROPLASTY  12/10/2011   Procedure: TOTAL KNEE ARTHROPLASTY;  Surgeon: [name], MD;  Location: [facility];  Service: Orthopedics;  Laterality: Left;  . TOTAL KNEE ARTHROPLASTY Right 08-08-2008    There were no vitals filed for this visit.  Subjective Assessment - 06/02/17 1348    Subjective  Pt reports that she is doing ok, reports that  she has some  hip pain due to the weather    Currently in Pain?  Yes    Pain Score  2     Pain Location  Hip    Pain Orientation  Left;Right                       [facility] Adult PT Treatment/Exercise - 06/02/17 0001      Exercises   Exercises  Knee/Hip      Knee/Hip Exercises: Aerobic   Nustep  L4 x 35min       Knee/Hip Exercises: Machines for Strengthening   Cybex Knee Extension  5lb 2x10     Cybex Knee Flexion  20lb 2x10    Cybex Leg Press  20# 2x10      Knee/Hip Exercises: Standing   Lateral Step Up  Both;1 set;10 reps;Hand Hold: 0;Step Height: 4"    Forward Step Up  Both;1 set;10 reps;Hand Hold: 0;Step Height: 6"     Walking with Sports Cord  20lb Side ste x3 each       Knee/Hip Exercises: Seated   Sit to Sand  2 sets;10 reps;without UE support holding yellow ball       Knee/Hip Exercises: Supine   Bridges  Strengthening;15 reps;2 sets    Other Supine Knee/Hip Exercises  Pball bridges, k2c, oblq               PT Short Term Goals - 05/28/17 1428      PT SHORT TERM GOAL #1   Title  independnet with initial HEP    Status  Achieved        PT Long Term Goals - 05/21/17 1349      PT LONG TERM GOAL #1   Title  walk without difficulty to go shopping    Time  8    Period  Weeks    Status  New      PT LONG TERM GOAL #2   Title  go up and down stairs step over step    Time  8    Period  Weeks    Status  New      PT LONG TERM GOAL #3   Title  report no pain    Time  8    Period  Weeks    Status  New      PT LONG TERM GOAL #4   Title  return to gym and water without difficulty    Time  8    Period  Weeks    Status  New            Plan - 06/02/17 1429    Clinical Impression Statement  No issues reported with today's interventions. Pt does demos some L hip weakness when coming back against resistance. Slightly antalgic gait after resisted side step. No reports of increase pain noted.     Rehab Potential  Good    PT Frequency  2x / week    PT Duration  4 weeks    PT Treatment/Interventions  ADLs/Self Care Home Management;Cryotherapy;Electrical Stimulation;Moist Heat;Gait training;Stair training;Functional mobility training;Therapeutic activities;Therapeutic exercise;Balance training;Manual techniques;Patient/family education    PT Next Visit Plan  Cont to progress proximal LE strength and stability to improve hip abduction strength and decreased increased lateral hip sway during gait that is a likely contributor to her hip pain with activity.        Patient will benefit from skilled therapeutic intervention in order to improve the following deficits  and impairments:   Abnormal gait, Decreased range of motion, Difficulty walking, Increased fascial restricitons, Pain, Impaired flexibility, Decreased strength  Visit Diagnosis: Difficulty in walking, not elsewhere classified  Pain in left hip     Problem List Patient Active Problem List   Diagnosis Date Noted  . Trochanteric bursitis of left hip 03/26/2017  . Trochanteric bursitis, left hip 03/26/2017  . 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 OA (osteoarthritis) of hip 04/29/2012  . Postop Acute blood loss anemia 12/12/2011  . Postop Hyponatremia 12/12/2011  . OA (osteoarthritis) of knee 12/10/2011  . ANXIETY STATE, UNSPECIFIED 05/13/2008  . ABDOMINAL PAIN-RUQ 05/13/2008  . DIARRHEA 04/15/2008  . CEREBROVASCULAR ACCIDENT, HX OF 10/23/2007  . HYPERLIPIDEMIA 10/22/2007  . HEMORRHOIDS, INTERNAL 10/22/2007  . EXTERNAL HEMORRHOIDS 10/22/2007  . GERD 10/22/2007  . DIVERTICULOSIS, COLON 10/22/2007  . IBS 10/22/2007  . OSTEOPOROSIS 10/22/2007  . TREMOR 10/22/2007  . COLONIC POLYPS, HX OF 10/22/2007    Scot Jun, PTA 06/02/2017, 2:36 PM  [address] [place], [place], [address] Phone: [phone]   Fax:  [phone]  Name: Erica Alvarez MRN: [id number] Date of Birth: 1945/06/11

## 2017-06-04 ENCOUNTER — Encounter: Payer: Self-pay | Admitting: Physical Therapy

## 2017-06-04 ENCOUNTER — Ambulatory Visit: Payer: Medicare Other | Admitting: Physical Therapy

## 2017-06-04 DIAGNOSIS — R262 Difficulty in walking, not elsewhere classified: Secondary | ICD-10-CM

## 2017-06-04 DIAGNOSIS — M25552 Pain in left hip: Secondary | ICD-10-CM

## 2017-06-04 NOTE — Therapy (Signed)
[facility] [place], [place], [address] Phone: [phone]   Fax:  [phone]  Physical Therapy Treatment  Patient Details  Name: Erica Alvarez MRN: [id number] Date of Birth: 05/02/1945 Referring Provider: Wynelle Link   Encounter Date: 06/04/2017  PT End of Session - 06/04/17 1421    Visit Number  5    PT Start Time  7893    PT Stop Time  1421    PT Time Calculation (min)  36 min       Past Medical History:  Diagnosis Date  . Acute sinus infection    03-14-2014 current taking Z-Pak-- no fever but non-productive cough  . Benign essential tremor    takes inderal  . Diverticulitis   . Family history of adverse reaction to anesthesia    "sister had episode hypotension"  . History of basal cell carcinoma excision    nose  . History of colon polyps   . History of CVA (cerebrovascular accident) without residual deficits    [id number]  embolic right frontal MCA -- found to have PFO and Atrial Septum aneursym /  s/p  closure of PFO  . History of DVT of lower extremity    2003  . History of gastroesophageal reflux (GERD)   . OA (osteoarthritis)    knees and hands  . Patellar clunk syndrome of left knee   . S/P patent foramen ovale closure dx post cva w/ PFO with atrial septal aneursym   07-01-2005  via Intracardiac echo probe with deployment of CardioSEAL septal occluder  . Tendon tear    left gluteal tendon     Past Surgical History:  Procedure Laterality Date  . ABDOMINAL HYSTERECTOMY  1982  . APPENDECTOMY  1960's  . EYE SURGERY  2018   cataract extraction with lens placement    . KNEE ARTHROSCOPY Bilateral left 02-21-2010/  right 11-04-2007   bilateral 2001  . KNEE ARTHROSCOPY Left 03/22/2015   Procedure: LEFT KNEE ARTHROSCOPY AND;  Surgeon: Gaynelle Arabian, MD;  Location: [facility];  Service: Orthopedics;  Laterality: Left;  . LAPAROSCOPIC CHOLECYSTECTOMY  1999  . OPEN SURGICAL  REPAIR OF GLUTEAL TENDON Left 03/26/2017   Procedure: OPEN SURGICAL REPAIR OF LEFT GLUTEAL TENDON;  Surgeon: Gaynelle Arabian, MD;  Location[facility];  Service: Orthopedics;  Laterality: Left;  . PATENT FORAMEN OVALE CLOSURE  07-01-2005  dr Einar Gip   via Intracardiac echocardiogram successful placement CardioSEAL septal occluder (PFO measured about 50mm)  . SYNOVECTOMY Left 03/22/2015   Procedure: SYNOVECTOMY;  Surgeon: Gaynelle Arabian, MD;  Location: [facility];  Service: Orthopedics;  Laterality: Left;  . TOENAIL TRIMMING    . TONSILLECTOMY  1960's  . TOTAL HIP ARTHROPLASTY Right 04/29/2012   Procedure: TOTAL HIP ARTHROPLASTY ANTERIOR APPROACH;  Surgeon: [name], MD;  Location[facility];  Service: Orthopedics;  Laterality: Right;  . TOTAL KNEE ARTHROPLASTY  12/10/2011   Procedure: TOTAL KNEE ARTHROPLASTY;  Surgeon: [name], MD;  Location: [facility];  Service: Orthopedics;  Laterality: Left;  . TOTAL KNEE ARTHROPLASTY Right 08-08-2008    There were no vitals filed for this visit.  Subjective Assessment - 06/04/17 1345    Subjective  "Doing ok but is sore over her hip"    Currently in Pain?  Yes    Pain Score  2     Pain Location  Hip    Pain Orientation  Right  [facility] Adult PT Treatment/Exercise - 06/04/17 0001      Knee/Hip Exercises: Aerobic   Recumbent Bike  L3 x 6 mins      Knee/Hip Exercises: Standing   Lateral Step Up  Both;1 set;10 reps;Hand Hold: 2 Both: 1 set 10 reps with 6in step    Forward Step Up  1 set;Hand Hold: 0;Step Height: 6"    Walking with Sports Cord  20lb side step x      Knee/Hip Exercises: Seated   Sit to Sand  3 sets;10 reps;without UE support      Knee/Hip Exercises: Supine   Bridges  3 sets;10 reps    Other Supine Knee/Hip Exercises  Pball bridges, k2c, oblq k2c               PT Short Term Goals - 05/28/17 1428      PT SHORT TERM GOAL #1   Title  independnet with initial HEP     Status  Achieved        PT Long Term Goals - 05/21/17 1349      PT LONG TERM GOAL #1   Title  walk without difficulty to go shopping    Time  8    Period  Weeks    Status  New      PT LONG TERM GOAL #2   Title  go up and down stairs step over step    Time  8    Period  Weeks    Status  New      PT LONG TERM GOAL #3   Title  report no pain    Time  8    Period  Weeks    Status  New      PT LONG TERM GOAL #4   Title  return to gym and water without difficulty    Time  8    Period  Weeks    Status  New            Plan - 06/04/17 1422    Clinical Impression Statement  Pt did well with increased step height and increased reps on sit to stand. She continues to have weakness lateral walking against resistance. After increased repletion of sit to stand patient had antalgic gait.     PT Frequency  2x / week    PT Duration  4 weeks    PT Next Visit Plan  Continue to progress LE strength       Patient will benefit from skilled therapeutic intervention in order to improve the following deficits and impairments:  Abnormal gait, Decreased range of motion, Difficulty walking, Increased fascial restricitons, Pain, Impaired flexibility, Decreased strength  Visit Diagnosis: Difficulty in walking, not elsewhere classified  Pain in left hip     Problem List Patient Active Problem List   Diagnosis Date Noted  . Trochanteric bursitis of left hip 03/26/2017  . Trochanteric bursitis, left hip 03/26/2017  . 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 OA (osteoarthritis) of hip 04/29/2012  . Postop Acute blood loss anemia 12/12/2011  . Postop Hyponatremia 12/12/2011  . OA (osteoarthritis) of knee 12/10/2011  . ANXIETY STATE, UNSPECIFIED 05/13/2008  . ABDOMINAL PAIN-RUQ 05/13/2008  . DIARRHEA 04/15/2008  . CEREBROVASCULAR ACCIDENT, HX OF 10/23/2007  . HYPERLIPIDEMIA 10/22/2007  . HEMORRHOIDS, INTERNAL 10/22/2007  . EXTERNAL HEMORRHOIDS 10/22/2007  . GERD 10/22/2007  .  DIVERTICULOSIS, COLON 10/22/2007  . IBS 10/22/2007  . OSTEOPOROSIS 10/22/2007  . TREMOR 10/22/2007  . COLONIC POLYPS,  HX OF 10/22/2007    Scot Jun, PTA 06/04/2017, 2:34 PM  [address] [place], [place], [address] Phone: [phone]   Fax:  [phone]  Name: ALEZANDRA EGLI MRN: [id number] Date of Birth: 11-Sep-1945

## 2017-06-10 ENCOUNTER — Ambulatory Visit: Payer: Medicare Other | Admitting: Physical Therapy

## 2017-06-10 ENCOUNTER — Encounter: Payer: Self-pay | Admitting: Physical Therapy

## 2017-06-10 DIAGNOSIS — R262 Difficulty in walking, not elsewhere classified: Secondary | ICD-10-CM | POA: Diagnosis not present

## 2017-06-10 DIAGNOSIS — M25552 Pain in left hip: Secondary | ICD-10-CM | POA: Diagnosis not present

## 2017-06-10 NOTE — Therapy (Signed)
[address] [place], [place], [address] Phone: [phone]   Fax:  [phone]  Physical Therapy Treatment  Patient Details  Name: Erica Alvarez MRN: [id number] Date of Birth: 10/09/1945 Referring Provider: Wynelle Link   Encounter Date: 06/10/2017  PT End of Session - 06/10/17 1429    Visit Number  6    Date for PT Re-Evaluation  06/21/17    PT Start Time  1345    PT Stop Time  1429    PT Time Calculation (min)  44 min    Activity Tolerance  Patient tolerated treatment well    Behavior During Therapy  [facility] for tasks assessed/performed       Past Medical History:  Diagnosis Date  . Acute sinus infection    03-14-2014 current taking Z-Pak-- no fever but non-productive cough  . Benign essential tremor    takes inderal  . Diverticulitis   . Family history of adverse reaction to anesthesia    "sister had episode hypotension"  . History of basal cell carcinoma excision    nose  . History of colon polyps   . History of CVA (cerebrovascular accident) without residual deficits    [id number]  embolic right frontal MCA -- found to have PFO and Atrial Septum aneursym /  s/p  closure of PFO  . History of DVT of lower extremity    2003  . History of gastroesophageal reflux (GERD)   . OA (osteoarthritis)    knees and hands  . Patellar clunk syndrome of left knee   . S/P patent foramen ovale closure dx post cva w/ PFO with atrial septal aneursym   07-01-2005  via Intracardiac echo probe with deployment of CardioSEAL septal occluder  . Tendon tear    left gluteal tendon     Past Surgical History:  Procedure Laterality Date  . ABDOMINAL HYSTERECTOMY  1982  . APPENDECTOMY  1960's  . EYE SURGERY  2018   cataract extraction with lens placement    . KNEE ARTHROSCOPY Bilateral left 02-21-2010/  right 11-04-2007   bilateral 2001  . KNEE ARTHROSCOPY Left 03/22/2015   Procedure: LEFT KNEE ARTHROSCOPY AND;  Surgeon:  Gaynelle Arabian, MD;  Location: [facility];  Service: Orthopedics;  Laterality: Left;  . LAPAROSCOPIC CHOLECYSTECTOMY  1999  . OPEN SURGICAL REPAIR OF GLUTEAL TENDON Left 03/26/2017   Procedure: OPEN SURGICAL REPAIR OF LEFT GLUTEAL TENDON;  Surgeon: Gaynelle Arabian, MD;  Location[facility];  Service: Orthopedics;  Laterality: Left;  . PATENT FORAMEN OVALE CLOSURE  07-01-2005  dr Einar Gip   via Intracardiac echocardiogram successful placement CardioSEAL septal occluder (PFO measured about 31mm)  . SYNOVECTOMY Left 03/22/2015   Procedure: SYNOVECTOMY;  Surgeon: Gaynelle Arabian, MD;  Location: [facility];  Service: Orthopedics;  Laterality: Left;  . TOENAIL TRIMMING    . TONSILLECTOMY  1960's  . TOTAL HIP ARTHROPLASTY Right 04/29/2012   Procedure: TOTAL HIP ARTHROPLASTY ANTERIOR APPROACH;  Surgeon: [name], MD;  Location[facility];  Service: Orthopedics;  Laterality: Right;  . TOTAL KNEE ARTHROPLASTY  12/10/2011   Procedure: TOTAL KNEE ARTHROPLASTY;  Surgeon: [name], MD;  [facility];  Service: Orthopedics;  Laterality: Left;  . TOTAL KNEE ARTHROPLASTY Right 08-08-2008    There were no vitals filed for this visit.  Subjective Assessment - 06/10/17 1343    Subjective  Pt stated that's one of the exercises are hurtling her. She thinks it  is the one that she stands up multiple of times.    Currently in Pain?  No/denies    Pain Score  0-No pain                       [facility] Adult PT Treatment/Exercise - 06/10/17 0001      Knee/Hip Exercises: Stretches   Passive Hamstring Stretch  Left;10 seconds;4 reps    ITB Stretch  Left;3 reps;10 seconds      Knee/Hip Exercises: Aerobic   Recumbent Bike  L4 x 7 mins      Knee/Hip Exercises: Standing   Lateral Step Up  1 set;10 reps;Step Height: 6";Hand Hold: 0;Left    Forward Step Up  1 set;Hand Hold: 0;Step Height: 6";Left;10 reps    Walking with Sports Cord  20lb side step 5 each      Knee/Hip  Exercises: Seated   Long Arc Quad  Both;2 sets;10 reps;Weights    Long Arc Quad Weight  3 lbs.    Ball Squeeze  2x10    Clamshell with TheraBand  Green 2x15    Hamstring Curl  2 sets;Both;15 reps    Sit to Sand  10 reps;without UE support               PT Short Term Goals - 05/28/17 1428      PT SHORT TERM GOAL #1   Title  independnet with initial HEP    Status  Achieved        PT Long Term Goals - 05/21/17 1349      PT LONG TERM GOAL #1   Title  walk without difficulty to go shopping    Time  8    Period  Weeks    Status  New      PT LONG TERM GOAL #2   Title  go up and down stairs step over step    Time  8    Period  Weeks    Status  New      PT LONG TERM GOAL #3   Title  report no pain    Time  8    Period  Weeks    Status  New      PT LONG TERM GOAL #4   Title  return to gym and water without difficulty    Time  8    Period  Weeks    Status  New            Plan - 06/10/17 1429    Clinical Impression Statement  Pt enters clinic reporting increase knee pain from sit to stands. Informed pt to decrease to once a day. No pain reported with sit to stand here. Pt does report a pulling sensation on lateral knee while on leg press. Some STM to ITB during passive HS stretch.    Rehab Potential  Good    PT Frequency  2x / week    PT Duration  4 weeks    PT Treatment/Interventions  ADLs/Self Care Home Management;Cryotherapy;Electrical Stimulation;Moist Heat;Gait training;Stair training;Functional mobility training;Therapeutic activities;Therapeutic exercise;Balance training;Manual techniques;Patient/family education    PT Next Visit Plan  Continue to progress LE strength       Patient will benefit from skilled therapeutic intervention in order to improve the following deficits and impairments:  Abnormal gait, Decreased range of motion, Difficulty walking, Increased fascial restricitons, Pain, Impaired flexibility, Decreased strength  Visit  Diagnosis: Pain in left hip  Difficulty in walking,  not elsewhere classified     Problem List Patient Active Problem List   Diagnosis Date Noted  . Trochanteric bursitis of left hip 03/26/2017  . Trochanteric bursitis, left hip 03/26/2017  . 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 OA (osteoarthritis) of hip 04/29/2012  . Postop Acute blood loss anemia 12/12/2011  . Postop Hyponatremia 12/12/2011  . OA (osteoarthritis) of knee 12/10/2011  . ANXIETY STATE, UNSPECIFIED 05/13/2008  . ABDOMINAL PAIN-RUQ 05/13/2008  . DIARRHEA 04/15/2008  . CEREBROVASCULAR ACCIDENT, HX OF 10/23/2007  . HYPERLIPIDEMIA 10/22/2007  . HEMORRHOIDS, INTERNAL 10/22/2007  . EXTERNAL HEMORRHOIDS 10/22/2007  . GERD 10/22/2007  . DIVERTICULOSIS, COLON 10/22/2007  . IBS 10/22/2007  . OSTEOPOROSIS 10/22/2007  . TREMOR 10/22/2007  . COLONIC POLYPS, HX OF 10/22/2007    Scot Jun, PTA 06/10/2017, 2:32 PM  [address] [place], [place], [address] Phone: [phone]   Fax:  [phone]  Name: Erica Alvarez MRN: [id number] Date of Birth: [birth year]

## 2017-06-13 ENCOUNTER — Encounter: Payer: Self-pay | Admitting: Physical Therapy

## 2017-06-13 ENCOUNTER — Ambulatory Visit: Payer: Medicare Other | Admitting: Physical Therapy

## 2017-06-13 DIAGNOSIS — M25552 Pain in left hip: Secondary | ICD-10-CM | POA: Diagnosis not present

## 2017-06-13 DIAGNOSIS — R262 Difficulty in walking, not elsewhere classified: Secondary | ICD-10-CM

## 2017-06-13 NOTE — Therapy (Signed)
[address] [place], [place], [address] Phone: [phone]   Fax:  [phone]  Physical Therapy Treatment  Patient Details  Name: Erica Alvarez MRN: [id number] Date of Birth: 02-27-1946 Referring Provider: Wynelle Link   Encounter Date: 06/13/2017  PT End of Session - 06/13/17 1015    Visit Number  7    Date for PT Re-Evaluation  06/21/17    PT Start Time  0930    PT Stop Time  1015    PT Time Calculation (min)  45 min    Activity Tolerance  Patient tolerated treatment well    Behavior During Therapy  [facility] for tasks assessed/performed       Past Medical History:  Diagnosis Date  . Acute sinus infection    03-14-2014 current taking Z-Pak-- no fever but non-productive cough  . Benign essential tremor    takes inderal  . Diverticulitis   . Family history of adverse reaction to anesthesia    "sister had episode hypotension"  . History of basal cell carcinoma excision    nose  . History of colon polyps   . History of CVA (cerebrovascular accident) without residual deficits    [id number]  embolic right frontal MCA -- found to have PFO and Atrial Septum aneursym /  s/p  closure of PFO  . History of DVT of lower extremity    2003  . History of gastroesophageal reflux (GERD)   . OA (osteoarthritis)    knees and hands  . Patellar clunk syndrome of left knee   . S/P patent foramen ovale closure dx post cva w/ PFO with atrial septal aneursym   07-01-2005  via Intracardiac echo probe with deployment of CardioSEAL septal occluder  . Tendon tear    left gluteal tendon     Past Surgical History:  Procedure Laterality Date  . ABDOMINAL HYSTERECTOMY  1982  . APPENDECTOMY  1960's  . EYE SURGERY  2018   cataract extraction with lens placement    . KNEE ARTHROSCOPY Bilateral left 02-21-2010/  right 11-04-2007   bilateral 2001  . KNEE ARTHROSCOPY Left 03/22/2015   Procedure: LEFT KNEE ARTHROSCOPY AND;   Surgeon: Gaynelle Arabian, MD;  Location: [facility];  Service: Orthopedics;  Laterality: Left;  . LAPAROSCOPIC CHOLECYSTECTOMY  1999  . OPEN SURGICAL REPAIR OF GLUTEAL TENDON Left 03/26/2017   Procedure: OPEN SURGICAL REPAIR OF LEFT GLUTEAL TENDON;  Surgeon: Gaynelle Arabian, MD;  Location: [facility];  Service: Orthopedics;  Laterality: Left;  . PATENT FORAMEN OVALE CLOSURE  07-01-2005  dr Einar Gip   via Intracardiac echocardiogram successful placement CardioSEAL septal occluder (PFO measured about 51mm)  . SYNOVECTOMY Left 03/22/2015   Procedure: SYNOVECTOMY;  Surgeon: Gaynelle Arabian, MD;  Location: [facility];  Service: Orthopedics;  Laterality: Left;  . TOENAIL TRIMMING    . TONSILLECTOMY  1960's  . TOTAL HIP ARTHROPLASTY Right 04/29/2012   Procedure: TOTAL HIP ARTHROPLASTY ANTERIOR APPROACH;  Surgeon: [name], MD;  Location: [facility];  Service: Orthopedics;  Laterality: Right;  . TOTAL KNEE ARTHROPLASTY  12/10/2011   Procedure: TOTAL KNEE ARTHROPLASTY;  Surgeon: [name], MD;  Location: [facility];  Service: Orthopedics;  Laterality: Left;  . TOTAL KNEE ARTHROPLASTY Right 08-08-2008    There were no vitals filed for this visit.  Subjective Assessment - 06/13/17 0928    Subjective  Pt stated that's her knee is better, she does reports some soreness in  both hips, "I did water aerobics yesterday"    Currently in Pain?  No/denies    Pain Score  0-No pain                       [facility] Adult PT Treatment/Exercise - 06/13/17 0001      Knee/Hip Exercises: Stretches   Passive Hamstring Stretch  10 seconds;5 reps;Both    ITB Stretch  Left;10 seconds;5 reps    Piriformis Stretch  Both;5 reps;10 seconds      Knee/Hip Exercises: Aerobic   Recumbent Bike  L4 x 7 mins      Knee/Hip Exercises: Machines for Strengthening   Cybex Leg Press  20# 2x15      Knee/Hip Exercises: Seated   Long Arc Quad  Both;2 sets;10 reps;Weights    Long Arc Quad Weight  3  lbs.    Ball Squeeze  2x10    Hamstring Curl  2 sets;Both;15 reps               PT Short Term Goals - 05/28/17 1428      PT SHORT TERM GOAL #1   Title  independnet with initial HEP    Status  Achieved        PT Long Term Goals - 05/21/17 1349      PT LONG TERM GOAL #1   Title  walk without difficulty to go shopping    Time  8    Period  Weeks    Status  New      PT LONG TERM GOAL #2   Title  go up and down stairs step over step    Time  8    Period  Weeks    Status  New      PT LONG TERM GOAL #3   Title  report no pain    Time  8    Period  Weeks    Status  New      PT LONG TERM GOAL #4   Title  return to gym and water without difficulty    Time  8    Period  Weeks    Status  New            Plan - 06/13/17 1027    Clinical Impression Statement  pt reports that her knee feel better but does have some soreness in her hips. Pt expressed that she did like stretching at the end of PT session last time and it really helped her. Slight antalgic gait after resisted side step but reports no pain. Pt unable to reach TKE with HS stretch, note bilat TKA    Rehab Potential  Good    PT Frequency  2x / week    PT Duration  4 weeks    PT Treatment/Interventions  ADLs/Self Care Home Management;Cryotherapy;Electrical Stimulation;Moist Heat;Gait training;Stair training;Functional mobility training;Therapeutic activities;Therapeutic exercise;Balance training;Manual techniques;Patient/family education    PT Next Visit Plan  Continue to progress LE strength       Patient will benefit from skilled therapeutic intervention in order to improve the following deficits and impairments:  Abnormal gait, Decreased range of motion, Difficulty walking, Increased fascial restricitons, Pain, Impaired flexibility, Decreased strength  Visit Diagnosis: Difficulty in walking, not elsewhere classified  Pain in left hip     Problem List Patient Active Problem List   Diagnosis  Date Noted  . Trochanteric bursitis of left hip 03/26/2017  . Trochanteric bursitis, left hip 03/26/2017  .  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 OA (osteoarthritis) of hip 04/29/2012  . Postop Acute blood loss anemia 12/12/2011  . Postop Hyponatremia 12/12/2011  . OA (osteoarthritis) of knee 12/10/2011  . ANXIETY STATE, UNSPECIFIED 05/13/2008  . ABDOMINAL PAIN-RUQ 05/13/2008  . DIARRHEA 04/15/2008  . CEREBROVASCULAR ACCIDENT, HX OF 10/23/2007  . HYPERLIPIDEMIA 10/22/2007  . HEMORRHOIDS, INTERNAL 10/22/2007  . EXTERNAL HEMORRHOIDS 10/22/2007  . GERD 10/22/2007  . DIVERTICULOSIS, COLON 10/22/2007  . IBS 10/22/2007  . OSTEOPOROSIS 10/22/2007  . TREMOR 10/22/2007  . COLONIC POLYPS, HX OF 10/22/2007    Scot Jun, PTA 06/13/2017, 10:29 AM  [address] [place], [place], [address] Phone: [phone]   Fax:  [phone]  Name: Erica Alvarez MRN: [id number] Date of Birth: Feb 17, 1946

## 2017-06-16 ENCOUNTER — Encounter: Payer: Self-pay | Admitting: Physical Therapy

## 2017-06-16 ENCOUNTER — Ambulatory Visit: Payer: Medicare Other | Admitting: Physical Therapy

## 2017-06-16 DIAGNOSIS — R262 Difficulty in walking, not elsewhere classified: Secondary | ICD-10-CM

## 2017-06-16 DIAGNOSIS — M25552 Pain in left hip: Secondary | ICD-10-CM | POA: Diagnosis not present

## 2017-06-16 NOTE — Therapy (Signed)
[address] [place], [place], [address] Phone: [phone]   Fax:  [phone]  Physical Therapy Treatment  Patient Details  Name: Erica Alvarez MRN: [id number] Date of Birth: [birth year] Referring Provider: Wynelle Link   Encounter Date: 06/16/2017  PT End of Session - 06/16/17 1143    Visit Number  8    Date for PT Re-Evaluation  06/21/17    PT Start Time  4765    PT Stop Time  1143    PT Time Calculation (min)  88 min    Activity Tolerance  Patient tolerated treatment well    Behavior During Therapy  [facility] for tasks assessed/performed       Past Medical History:  Diagnosis Date  . Acute sinus infection    03-14-2014 current taking Z-Pak-- no fever but non-productive cough  . Benign essential tremor    takes inderal  . Diverticulitis   . Family history of adverse reaction to anesthesia    "sister had episode hypotension"  . History of basal cell carcinoma excision    nose  . History of colon polyps   . History of CVA (cerebrovascular accident) without residual deficits    [id number]  embolic right frontal MCA -- found to have PFO and Atrial Septum aneursym /  s/p  closure of PFO  . History of DVT of lower extremity    2003  . History of gastroesophageal reflux (GERD)   . OA (osteoarthritis)    knees and hands  . Patellar clunk syndrome of left knee   . S/P patent foramen ovale closure dx post cva w/ PFO with atrial septal aneursym   07-01-2005  via Intracardiac echo probe with deployment of CardioSEAL septal occluder  . Tendon tear    left gluteal tendon     Past Surgical History:  Procedure Laterality Date  . ABDOMINAL HYSTERECTOMY  1982  . APPENDECTOMY  1960's  . EYE SURGERY  2018   cataract extraction with lens placement    . KNEE ARTHROSCOPY Bilateral left 02-21-2010/  right 11-04-2007   bilateral 2001  . KNEE ARTHROSCOPY Left 03/22/2015   Procedure: LEFT KNEE ARTHROSCOPY AND;   Surgeon: Gaynelle Arabian, MD;  Location: [facility];  Service: Orthopedics;  Laterality: Left;  . LAPAROSCOPIC CHOLECYSTECTOMY  1999  . OPEN SURGICAL REPAIR OF GLUTEAL TENDON Left 03/26/2017   Procedure: OPEN SURGICAL REPAIR OF LEFT GLUTEAL TENDON;  Surgeon: Gaynelle Arabian, MD;  [facility];  Service: Orthopedics;  Laterality: Left;  . PATENT FORAMEN OVALE CLOSURE  07-01-2005  dr Einar Gip   via Intracardiac echocardiogram successful placement CardioSEAL septal occluder (PFO measured about 75m)  . SYNOVECTOMY Left 03/22/2015   Procedure: SYNOVECTOMY;  Surgeon: FGaynelle Arabian MD;  [facility]  Service: Orthopedics;  Laterality: Left;  . TOENAIL TRIMMING    . TONSILLECTOMY  1960's  . TOTAL HIP ARTHROPLASTY Right 04/29/2012   Procedure: TOTAL HIP ARTHROPLASTY ANTERIOR APPROACH;  Surgeon: FGearlean Alf MD;  [facility];  Service: Orthopedics;  Laterality: Right;  . TOTAL KNEE ARTHROPLASTY  12/10/2011   Procedure: TOTAL KNEE ARTHROPLASTY;  Surgeon: FGearlean Alf MD;  [facility];  Service: Orthopedics;  Laterality: Left;  . TOTAL KNEE ARTHROPLASTY Right 08-08-2008    There were no vitals filed for this visit.  Subjective Assessment - 06/16/17 1102    Subjective  Pt stated that she had one spot that is really sore. Pt pointed  to her inner CL thigh    Currently in Pain?  No/denies    Pain Score  0-No pain                       [facility] Adult PT Treatment/Exercise - 06/16/17 0001      Knee/Hip Exercises: Stretches   Passive Hamstring Stretch  10 seconds;5 reps;Both    ITB Stretch  10 seconds;5 reps;Both    Piriformis Stretch  Both;5 reps;10 seconds      Knee/Hip Exercises: Aerobic   Recumbent Bike  L4 x 7 mins      Knee/Hip Exercises: Machines for Strengthening   Cybex Knee Flexion  20lb 2x10      Knee/Hip Exercises: Standing   Walking with Sports Cord  30lb side step 5 each      Knee/Hip Exercises: Seated   Ball  Squeeze  2x10    Clamshell with Marga Hoots 2x15       Knee/Hip Exercises: Supine   Bridges  Both;1 set;15 reps    Straight Leg Raises  Both;10 reps;1 set      Stair negotiation Pt I with one flight alternating pattern, one rail R. She does reports some L knee pain.         PT Short Term Goals - 05/28/17 1428      PT SHORT TERM GOAL #1   Title  independnet with initial HEP    Status  Achieved        PT Long Term Goals - 06/16/17 1115      PT LONG TERM GOAL #1   Title  walk without difficulty to go shopping    Status  Achieved      PT LONG TERM GOAL #3   Title  report no pain    Status  Partially Met      PT LONG TERM GOAL #4   Title  return to gym and water without difficulty    Status  Achieved            Plan - 06/16/17 1144    Clinical Impression Statement  All interventions completed well, again slightly antalgic gait after resisted side step. Progress to stair negotiation without issues but she does reports some L knee pain.  Pt reports a stretching sensation in groin area with SLR on L side.    Rehab Potential  Good    PT Frequency  2x / week    PT Duration  4 weeks    PT Treatment/Interventions  ADLs/Self Care Home Management;Cryotherapy;Electrical Stimulation;Moist Heat;Gait training;Stair training;Functional mobility training;Therapeutic activities;Therapeutic exercise;Balance training;Manual techniques;Patient/family education    PT Next Visit Plan  Continue to progress LE strength       Patient will benefit from skilled therapeutic intervention in order to improve the following deficits and impairments:  Abnormal gait, Decreased range of motion, Difficulty walking, Increased fascial restricitons, Pain, Impaired flexibility, Decreased strength  Visit Diagnosis: Difficulty in walking, not elsewhere classified     Problem List Patient Active Problem List   Diagnosis Date Noted  . Trochanteric bursitis of left hip 03/26/2017  .  Trochanteric bursitis, left hip 03/26/2017  . 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 OA (osteoarthritis) of hip 04/29/2012  . Postop Acute blood loss anemia 12/12/2011  . Postop Hyponatremia 12/12/2011  . OA (osteoarthritis) of knee 12/10/2011  . ANXIETY STATE, UNSPECIFIED 05/13/2008  . ABDOMINAL PAIN-RUQ 05/13/2008  . DIARRHEA 04/15/2008  . CEREBROVASCULAR ACCIDENT, HX OF 10/23/2007  .  HYPERLIPIDEMIA 10/22/2007  . HEMORRHOIDS, INTERNAL 10/22/2007  . EXTERNAL HEMORRHOIDS 10/22/2007  . GERD 10/22/2007  . DIVERTICULOSIS, COLON 10/22/2007  . IBS 10/22/2007  . OSTEOPOROSIS 10/22/2007  . TREMOR 10/22/2007  . COLONIC POLYPS, HX OF 10/22/2007    Scot Jun, PTA 06/16/2017, 11:49 AM  [address] [place], [place], [address] Phone: [phone]   Fax:  [phone]  Name: Erica Alvarez MRN: [id number] Date of Birth: 1945-11-29

## 2017-06-17 ENCOUNTER — Encounter: Payer: Medicare Other | Admitting: Physical Therapy

## 2017-06-17 DIAGNOSIS — F4321 Adjustment disorder with depressed mood: Secondary | ICD-10-CM | POA: Diagnosis not present

## 2017-06-19 ENCOUNTER — Ambulatory Visit: Payer: Medicare Other | Admitting: Physical Therapy

## 2017-06-19 ENCOUNTER — Encounter: Payer: Self-pay | Admitting: Physical Therapy

## 2017-06-19 DIAGNOSIS — R262 Difficulty in walking, not elsewhere classified: Secondary | ICD-10-CM | POA: Diagnosis not present

## 2017-06-19 DIAGNOSIS — M25552 Pain in left hip: Secondary | ICD-10-CM | POA: Diagnosis not present

## 2017-06-19 NOTE — Therapy (Signed)
[address] [place], [place], [address] Phone: [phone]   Fax:  [phone]  Physical Therapy Treatment  Patient Details  Name: Erica Alvarez MRN: [id number] Date of Birth: [birth year] Referring Provider: Wynelle Link   Encounter Date: 06/19/2017  PT End of Session - 06/19/17 1057    Visit Number  9    Date for PT Re-Evaluation  06/21/17    PT Start Time  6962    PT Stop Time  1057    PT Time Calculation (min)  42 min    Activity Tolerance  Patient tolerated treatment well    Behavior During Therapy  [facility] for tasks assessed/performed       Past Medical History:  Diagnosis Date  . Acute sinus infection    03-14-2014 current taking Z-Pak-- no fever but non-productive cough  . Benign essential tremor    takes inderal  . Diverticulitis   . Family history of adverse reaction to anesthesia    "sister had episode hypotension"  . History of basal cell carcinoma excision    nose  . History of colon polyps   . History of CVA (cerebrovascular accident) without residual deficits    [id number]  embolic right frontal MCA -- found to have PFO and Atrial Septum aneursym /  s/p  closure of PFO  . History of DVT of lower extremity    2003  . History of gastroesophageal reflux (GERD)   . OA (osteoarthritis)    knees and hands  . Patellar clunk syndrome of left knee   . S/P patent foramen ovale closure dx post cva w/ PFO with atrial septal aneursym   07-01-2005  via Intracardiac echo probe with deployment of CardioSEAL septal occluder  . Tendon tear    left gluteal tendon     Past Surgical History:  Procedure Laterality Date  . ABDOMINAL HYSTERECTOMY  1982  . APPENDECTOMY  1960's  . EYE SURGERY  2018   cataract extraction with lens placement    . KNEE ARTHROSCOPY Bilateral left 02-21-2010/  right 11-04-2007   bilateral 2001  . KNEE ARTHROSCOPY Left 03/22/2015   Procedure: LEFT KNEE ARTHROSCOPY AND;   Surgeon: Gaynelle Arabian, MD;  Location: [facility];  Service: Orthopedics;  Laterality: Left;  . LAPAROSCOPIC CHOLECYSTECTOMY  1999  . OPEN SURGICAL REPAIR OF GLUTEAL TENDON Left 03/26/2017   Procedure: OPEN SURGICAL REPAIR OF LEFT GLUTEAL TENDON;  Surgeon: Gaynelle Arabian, MD;  [facility];  Service: Orthopedics;  Laterality: Left;  . PATENT FORAMEN OVALE CLOSURE  07-01-2005  dr Einar Gip   via Intracardiac echocardiogram successful placement CardioSEAL septal occluder (PFO measured about 1m)  . SYNOVECTOMY Left 03/22/2015   Procedure: SYNOVECTOMY;  Surgeon: FGaynelle Arabian MD;  [facility]  Service: Orthopedics;  Laterality: Left;  . TOENAIL TRIMMING    . TONSILLECTOMY  1960's  . TOTAL HIP ARTHROPLASTY Right 04/29/2012   Procedure: TOTAL HIP ARTHROPLASTY ANTERIOR APPROACH;  Surgeon: FGearlean Alf MD;  [facility];  Service: Orthopedics;  Laterality: Right;  . TOTAL KNEE ARTHROPLASTY  12/10/2011   Procedure: TOTAL KNEE ARTHROPLASTY;  Surgeon: FGearlean Alf MD;  [facility];  Service: Orthopedics;  Laterality: Left;  . TOTAL KNEE ARTHROPLASTY Right 08-08-2008    There were no vitals filed for this visit.  Subjective Assessment - 06/19/17 1015    Subjective  Pt stated that she went to the MD and he was pleased.  Currently in Pain?  No/denies    Pain Score  0-No pain                       [facility] Adult PT Treatment/Exercise - 06/19/17 0001      Knee/Hip Exercises: Stretches   Passive Hamstring Stretch  5 reps;Both;20 seconds    ITB Stretch  5 reps;Both;20 seconds    Piriformis Stretch  Both;5 reps;20 seconds      Knee/Hip Exercises: Aerobic   Elliptical  I5 R3 x2 min    Recumbent Bike  L0 x4 min      Knee/Hip Exercises: Machines for Strengthening   Cybex Knee Flexion  20lb 2x10    Cybex Leg Press  20# 2x15      Knee/Hip Exercises: Standing   Hip Flexion  1 set;2 sets;10 reps;Knee bent 2lb    Lateral Step Up   1 set;10 reps;Step Height: 6";Hand Hold: 0;Left    Other Standing Knee Exercises  abd and flex 2lb 2x10       Knee/Hip Exercises: Supine   Bridges  Both;1 set;20 reps    Bridges with [employer]  Both;2 sets;10 reps               PT Short Term Goals - 05/28/17 1428      PT SHORT TERM GOAL #1   Title  independnet with initial HEP    Status  Achieved        PT Long Term Goals - 06/16/17 1115      PT LONG TERM GOAL #1   Title  walk without difficulty to go shopping    Status  Achieved      PT LONG TERM GOAL #3   Title  report no pain    Status  Partially Met      PT LONG TERM GOAL #4   Title  return to gym and water without difficulty    Status  Achieved            Plan - 06/19/17 1058    Clinical Impression Statement  Pt continues to do well in therapy, progressed to elliptical aerobic warm up with increase fatigue. No issues with standing hip exercises with 2lb resistance. Unable to reach full knee ext with passive HS stretches, could be due to bilat knee replacements in the past.     Rehab Potential  Good    PT Frequency  2x / week    PT Duration  4 weeks    PT Treatment/Interventions  ADLs/Self Care Home Management;Cryotherapy;Electrical Stimulation;Moist Heat;Gait training;Stair training;Functional mobility training;Therapeutic activities;Therapeutic exercise;Balance training;Manual techniques;Patient/family education    PT Next Visit Plan  Continue to progress LE strength       Patient will benefit from skilled therapeutic intervention in order to improve the following deficits and impairments:  Abnormal gait, Decreased range of motion, Difficulty walking, Increased fascial restricitons, Pain, Impaired flexibility, Decreased strength  Visit Diagnosis: Pain in left hip  Difficulty in walking, not elsewhere classified     Problem List Patient Active Problem List   Diagnosis Date Noted  . Trochanteric bursitis of left hip 03/26/2017  .  Trochanteric bursitis, left hip 03/26/2017  . 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 OA (osteoarthritis) of hip 04/29/2012  . Postop Acute blood loss anemia 12/12/2011  . Postop Hyponatremia 12/12/2011  . OA (osteoarthritis) of knee 12/10/2011  . ANXIETY STATE, UNSPECIFIED 05/13/2008  . ABDOMINAL PAIN-RUQ 05/13/2008  . DIARRHEA 04/15/2008  . CEREBROVASCULAR  ACCIDENT, HX OF 10/23/2007  . HYPERLIPIDEMIA 10/22/2007  . HEMORRHOIDS, INTERNAL 10/22/2007  . EXTERNAL HEMORRHOIDS 10/22/2007  . GERD 10/22/2007  . DIVERTICULOSIS, COLON 10/22/2007  . IBS 10/22/2007  . OSTEOPOROSIS 10/22/2007  . TREMOR 10/22/2007  . COLONIC POLYPS, HX OF 10/22/2007    Scot Jun, PTA 06/19/2017, 11:00 AM  [address] [place], [place], [address] Phone: [phone]   Fax:  [phone]  Name: Erica Alvarez MRN: [id number] Date of Birth: [birth year]

## 2017-06-23 ENCOUNTER — Encounter: Payer: Self-pay | Admitting: Physical Therapy

## 2017-06-23 ENCOUNTER — Ambulatory Visit: Payer: Medicare Other | Admitting: Physical Therapy

## 2017-06-23 DIAGNOSIS — R262 Difficulty in walking, not elsewhere classified: Secondary | ICD-10-CM | POA: Diagnosis not present

## 2017-06-23 DIAGNOSIS — M25552 Pain in left hip: Secondary | ICD-10-CM | POA: Diagnosis not present

## 2017-06-23 NOTE — Addendum Note (Signed)
Addended by: Sumner Boast on: 06/23/2017 02:42 PM   Modules accepted: Orders

## 2017-06-23 NOTE — Therapy (Signed)
[address] [place], [place], [address] Phone: [phone]   Fax:  [phone]  Physical Therapy Treatment  Patient Details  Name: Erica Alvarez MRN: [id number] Date of Birth: [birth year] Referring Provider: Wynelle Link   Encounter Date: 06/23/2017  PT End of Session - 06/23/17 1147    Visit Number  10    Date for PT Re-Evaluation  06/21/17    PT Start Time  1100    PT Stop Time  1147    PT Time Calculation (min)  47 min    Activity Tolerance  Patient limited by pain    Behavior During Therapy  [facility] for tasks assessed/performed       Past Medical History:  Diagnosis Date  . Acute sinus infection    03-14-2014 current taking Z-Pak-- no fever but non-productive cough  . Benign essential tremor    takes inderal  . Diverticulitis   . Family history of adverse reaction to anesthesia    "sister had episode hypotension"  . History of basal cell carcinoma excision    nose  . History of colon polyps   . History of CVA (cerebrovascular accident) without residual deficits    53-20-2334  embolic right frontal MCA -- found to have PFO and Atrial Septum aneursym /  s/p  closure of PFO  . History of DVT of lower extremity    2003  . History of gastroesophageal reflux (GERD)   . OA (osteoarthritis)    knees and hands  . Patellar clunk syndrome of left knee   . S/P patent foramen ovale closure dx post cva w/ PFO with atrial septal aneursym   07-01-2005  via Intracardiac echo probe with deployment of CardioSEAL septal occluder  . Tendon tear    left gluteal tendon     Past Surgical History:  Procedure Laterality Date  . ABDOMINAL HYSTERECTOMY  1982  . APPENDECTOMY  1960's  . EYE SURGERY  2018   cataract extraction with lens placement    . KNEE ARTHROSCOPY Bilateral left 02-21-2010/  right 11-04-2007   bilateral 2001  . KNEE ARTHROSCOPY Left 03/22/2015   Procedure: LEFT KNEE ARTHROSCOPY AND;  Surgeon: Gaynelle Arabian, MD;  Location: [facility];  Service: Orthopedics;  Laterality: Left;  . LAPAROSCOPIC CHOLECYSTECTOMY  1999  . OPEN SURGICAL REPAIR OF GLUTEAL TENDON Left 03/26/2017   Procedure: OPEN SURGICAL REPAIR OF LEFT GLUTEAL TENDON;  Surgeon: Gaynelle Arabian, MD;  Location: [facility];  Service: Orthopedics;  Laterality: Left;  . PATENT FORAMEN OVALE CLOSURE  07-01-2005  dr Einar Gip   via Intracardiac echocardiogram successful placement CardioSEAL septal occluder (PFO measured about 5m)  . SYNOVECTOMY Left 03/22/2015   Procedure: SYNOVECTOMY;  Surgeon: FGaynelle Arabian MD;  [facility]  Service: Orthopedics;  Laterality: Left;  . TOENAIL TRIMMING    . TONSILLECTOMY  1960's  . TOTAL HIP ARTHROPLASTY Right 04/29/2012   Procedure: TOTAL HIP ARTHROPLASTY ANTERIOR APPROACH;  Surgeon: FGearlean Alf MD;  Location: [facility];  Service: Orthopedics;  Laterality: Right;  . TOTAL KNEE ARTHROPLASTY  12/10/2011   Procedure: TOTAL KNEE ARTHROPLASTY;  Surgeon: FGearlean Alf MD;  Location: [facility];  Service: Orthopedics;  Laterality: Left;  . TOTAL KNEE ARTHROPLASTY Right 08-08-2008    There were no vitals filed for this visit.  Subjective Assessment - 06/23/17 1103    Subjective  Pt reports that all is well she is just tired today  Currently in Pain?  No/denies    Pain Score  0-No pain                       [facility] Adult PT Treatment/Exercise - 06/23/17 0001      Ambulation/Gait   Ambulation/Gait  Yes    Ambulation/Gait Assistance  7: Independent    Ambulation Distance (Feet)  -- >200    Assistive device  None    Gait Pattern  Lateral trunk lean to left    Ambulation Surface  Unlevel;Outdoor;Paved    Stairs  Yes    Stairs Assistance  6: Modified independent (Device/Increase time)    Stair Management Technique  One rail Right    Number of Stairs  24    Door Management  6: Modified independent (Device/Increase time)    Gait Comments  one flight  then back down out side up slope, Some lateral sway during stance phase on LLE      Knee/Hip Exercises: Stretches   Passive Hamstring Stretch  5 reps;Both;20 seconds    ITB Stretch  5 reps;Both;20 seconds    Piriformis Stretch  Both;5 reps;20 seconds      Knee/Hip Exercises: Aerobic   Elliptical  I5 R3 x2 min    Recumbent Bike  L1 x5 min      Knee/Hip Exercises: Machines for Strengthening   Cybex Knee Flexion  20lb 2x10    Cybex Leg Press  20# 2x15      Knee/Hip Exercises: Standing   Walking with Sports Cord  20lb side step 5 each               PT Short Term Goals - 05/28/17 1428      PT SHORT TERM GOAL #1   Title  independnet with initial HEP    Status  Achieved        PT Long Term Goals - 06/16/17 1115      PT LONG TERM GOAL #1   Title  walk without difficulty to go shopping    Status  Achieved      PT LONG TERM GOAL #3   Title  report no pain    Status  Partially Met      PT LONG TERM GOAL #4   Title  return to gym and water without difficulty    Status  Achieved            Plan - 06/23/17 1150    Clinical Impression Statement  No issue with today's activities. reports going to the gym Friday and doing some exercises. Fatigue quick with aerobic warm up on elliptical and with functional interventions. No reports of increase pain, Positive response form stretching.     Rehab Potential  Good    PT Frequency  2x / week    PT Duration  4 weeks    PT Treatment/Interventions  ADLs/Self Care Home Management;Cryotherapy;Electrical Stimulation;Moist Heat;Gait training;Stair training;Functional mobility training;Therapeutic activities;Therapeutic exercise;Balance training;Manual techniques;Patient/family education    PT Next Visit Plan  Continue to progress LE strength and endurance      Patient will benefit from skilled therapeutic intervention in order to improve the following deficits and impairments:  Abnormal gait, Decreased range of motion, Difficulty  walking, Increased fascial restricitons, Pain, Impaired flexibility, Decreased strength  Visit Diagnosis: Pain in left hip  Difficulty in walking, not elsewhere classified     Problem List Patient Active Problem List   Diagnosis Date Noted  . Trochanteric bursitis of  left hip 03/26/2017  . Trochanteric bursitis, left hip 03/26/2017  . 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 OA (osteoarthritis) of hip 04/29/2012  . Postop Acute blood loss anemia 12/12/2011  . Postop Hyponatremia 12/12/2011  . OA (osteoarthritis) of knee 12/10/2011  . ANXIETY STATE, UNSPECIFIED 05/13/2008  . ABDOMINAL PAIN-RUQ 05/13/2008  . DIARRHEA 04/15/2008  . CEREBROVASCULAR ACCIDENT, HX OF 10/23/2007  . HYPERLIPIDEMIA 10/22/2007  . HEMORRHOIDS, INTERNAL 10/22/2007  . EXTERNAL HEMORRHOIDS 10/22/2007  . GERD 10/22/2007  . DIVERTICULOSIS, COLON 10/22/2007  . IBS 10/22/2007  . OSTEOPOROSIS 10/22/2007  . TREMOR 10/22/2007  . COLONIC POLYPS, HX OF 10/22/2007    Scot Jun, PTA 06/23/2017, 11:52 AM  [address] [place], [place], [address] Phone: [phone]   Fax:  [phone]  Name: Erica Alvarez MRN: [id number] Date of Birth: 1945/05/29

## 2017-06-25 ENCOUNTER — Encounter: Payer: Self-pay | Admitting: Physical Therapy

## 2017-06-25 ENCOUNTER — Ambulatory Visit: Payer: Medicare Other | Admitting: Physical Therapy

## 2017-06-25 DIAGNOSIS — R262 Difficulty in walking, not elsewhere classified: Secondary | ICD-10-CM

## 2017-06-25 DIAGNOSIS — M25552 Pain in left hip: Secondary | ICD-10-CM

## 2017-06-25 NOTE — Therapy (Signed)
[address] [place], [place], [address] Phone: [phone]   Fax:  [phone]  Physical Therapy Treatment  Patient Details  Name: Erica Alvarez MRN: [id number] Date of Birth: December 28, 1945 Referring Provider: Wynelle Link   Encounter Date: 06/25/2017  PT End of Session - 06/25/17 1147    Visit Number  11    Date for PT Re-Evaluation  07/21/17    PT Start Time  1100    PT Stop Time  1145    PT Time Calculation (min)  45 min    Activity Tolerance  Patient tolerated treatment well    Behavior During Therapy  [facility] for tasks assessed/performed       Past Medical History:  Diagnosis Date  . Acute sinus infection    03-14-2014 current taking Z-Pak-- no fever but non-productive cough  . Benign essential tremor    takes inderal  . Diverticulitis   . Family history of adverse reaction to anesthesia    "sister had episode hypotension"  . History of basal cell carcinoma excision    nose  . History of colon polyps   . History of CVA (cerebrovascular accident) without residual deficits    17-71-1657  embolic right frontal MCA -- found to have PFO and Atrial Septum aneursym /  s/p  closure of PFO  . History of DVT of lower extremity    2003  . History of gastroesophageal reflux (GERD)   . OA (osteoarthritis)    knees and hands  . Patellar clunk syndrome of left knee   . S/P patent foramen ovale closure dx post cva w/ PFO with atrial septal aneursym   07-01-2005  via Intracardiac echo probe with deployment of CardioSEAL septal occluder  . Tendon tear    left gluteal tendon     Past Surgical History:  Procedure Laterality Date  . ABDOMINAL HYSTERECTOMY  1982  . APPENDECTOMY  1960's  . EYE SURGERY  2018   cataract extraction with lens placement    . KNEE ARTHROSCOPY Bilateral left 02-21-2010/  right 11-04-2007   bilateral 2001  . KNEE ARTHROSCOPY Left 03/22/2015   Procedure: LEFT KNEE ARTHROSCOPY AND;   Surgeon: Gaynelle Arabian, MD;  Location: [facility];  Service: Orthopedics;  Laterality: Left;  . LAPAROSCOPIC CHOLECYSTECTOMY  1999  . OPEN SURGICAL REPAIR OF GLUTEAL TENDON Left 03/26/2017   Procedure: OPEN SURGICAL REPAIR OF LEFT GLUTEAL TENDON;  Surgeon: Gaynelle Arabian, MD;  [facility];  Service: Orthopedics;  Laterality: Left;  . PATENT FORAMEN OVALE CLOSURE  07-01-2005  dr Einar Gip   via Intracardiac echocardiogram successful placement CardioSEAL septal occluder (PFO measured about 10m)  . SYNOVECTOMY Left 03/22/2015   Procedure: SYNOVECTOMY;  Surgeon: FGaynelle Arabian MD;  [facility]  Service: Orthopedics;  Laterality: Left;  . TOENAIL TRIMMING    . TONSILLECTOMY  1960's  . TOTAL HIP ARTHROPLASTY Right 04/29/2012   Procedure: TOTAL HIP ARTHROPLASTY ANTERIOR APPROACH;  Surgeon: FGearlean Alf MD;  [facility];  Service: Orthopedics;  Laterality: Right;  . TOTAL KNEE ARTHROPLASTY  12/10/2011   Procedure: TOTAL KNEE ARTHROPLASTY;  Surgeon: FGearlean Alf MD;  [facility];  Service: Orthopedics;  Laterality: Left;  . TOTAL KNEE ARTHROPLASTY Right 08-08-2008    There were no vitals filed for this visit.  Subjective Assessment - 06/25/17 1105    Subjective  "Im am doing ok"    Currently in Pain?  Yes  Pain Score  2     Pain Location  -- calves                       [facility] Adult PT Treatment/Exercise - 06/25/17 0001      Ambulation/Gait   Ambulation/Gait  Yes    Ambulation/Gait Assistance  7: Independent    Ambulation Distance (Feet)  -- >200    Assistive device  None    Gait Pattern  Lateral trunk lean to left    Ambulation Surface  Unlevel;Outdoor;Paved    Stairs  Yes    Stairs Assistance  6: Modified independent (Device/Increase time)    Stair Management Technique  One rail Right    Number of Stairs  24    Door Management  6: Modified independent (Device/Increase time)    Gait Comments  one flight then  back down out side up slope, Some lateral sway during stance phase on LLE      Knee/Hip Exercises: Stretches   Passive Hamstring Stretch  5 reps;Both;20 seconds    ITB Stretch  5 reps;Both;20 seconds    Piriformis Stretch  Both;5 reps;20 seconds      Knee/Hip Exercises: Aerobic   Elliptical  I5 R3 x2 min 1 min back    Recumbent Bike  L0 x5 min      Knee/Hip Exercises: Machines for Strengthening   Cybex Knee Extension  5lb 2x10     Cybex Knee Flexion  20lb 2x15    Cybex Leg Press  30# 2x15      Knee/Hip Exercises: Standing   Lateral Step Up  1 set;10 reps;Step Height: 6";Hand Hold: 0;Both      Knee/Hip Exercises: Seated   Sit to Sand  2 sets;10 reps;without UE support holding blue ball from blue chair               PT Short Term Goals - 05/28/17 1428      PT SHORT TERM GOAL #1   Title  independnet with initial HEP    Status  Achieved        PT Long Term Goals - 06/16/17 1115      PT LONG TERM GOAL #1   Title  walk without difficulty to go shopping    Status  Achieved      PT LONG TERM GOAL #3   Title  report no pain    Status  Partially Met      PT LONG TERM GOAL #4   Title  return to gym and water without difficulty    Status  Achieved            Plan - 06/25/17 1147    Clinical Impression Statement  Pt continues to do well and is progressing towards goals. She does continue to fatigue quick with functional activities. Positive response to LE stretching.     Rehab Potential  Good    PT Frequency  2x / week    PT Duration  4 weeks    PT Treatment/Interventions  ADLs/Self Care Home Management;Cryotherapy;Electrical Stimulation;Moist Heat;Gait training;Stair training;Functional mobility training;Therapeutic activities;Therapeutic exercise;Balance training;Manual techniques;Patient/family education    PT Next Visit Plan  Continue to progress LE strength       Patient will benefit from skilled therapeutic intervention in order to improve the  following deficits and impairments:  Abnormal gait, Decreased range of motion, Difficulty walking, Increased fascial restricitons, Pain, Impaired flexibility, Decreased strength  Visit Diagnosis: Pain in left hip  Difficulty in walking, not elsewhere classified     Problem List Patient Active Problem List   Diagnosis Date Noted  . Trochanteric bursitis of left hip 03/26/2017  . Trochanteric bursitis, left hip 03/26/2017  . 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 OA (osteoarthritis) of hip 04/29/2012  . Postop Acute blood loss anemia 12/12/2011  . Postop Hyponatremia 12/12/2011  . OA (osteoarthritis) of knee 12/10/2011  . ANXIETY STATE, UNSPECIFIED 05/13/2008  . ABDOMINAL PAIN-RUQ 05/13/2008  . DIARRHEA 04/15/2008  . CEREBROVASCULAR ACCIDENT, HX OF 10/23/2007  . HYPERLIPIDEMIA 10/22/2007  . HEMORRHOIDS, INTERNAL 10/22/2007  . EXTERNAL HEMORRHOIDS 10/22/2007  . GERD 10/22/2007  . DIVERTICULOSIS, COLON 10/22/2007  . IBS 10/22/2007  . OSTEOPOROSIS 10/22/2007  . TREMOR 10/22/2007  . COLONIC POLYPS, HX OF 10/22/2007    Scot Jun, PTA 06/25/2017, 11:49 AM  [address] [place], [place], [address] Phone: [phone]   Fax:  [phone]  Name: Erica Alvarez MRN: [id number] Date of Birth: 04-14-1945

## 2017-06-30 ENCOUNTER — Ambulatory Visit: Payer: Medicare Other | Admitting: Physical Therapy

## 2017-06-30 ENCOUNTER — Encounter: Payer: Self-pay | Admitting: Physical Therapy

## 2017-06-30 DIAGNOSIS — R262 Difficulty in walking, not elsewhere classified: Secondary | ICD-10-CM

## 2017-06-30 DIAGNOSIS — M25552 Pain in left hip: Secondary | ICD-10-CM | POA: Diagnosis not present

## 2017-06-30 NOTE — Therapy (Signed)
[place] [address] [place], [place], [address] Phone: [phone]   Fax:  [phone]  Physical Therapy Treatment  Patient Details  Name: Erica Alvarez MRN: [id number] Date of Birth: 1945/03/31 Referring Provider: Wynelle Link   Encounter Date: 06/30/2017  PT End of Session - 06/30/17 1150    Visit Number  12    Date for PT Re-Evaluation  07/21/17    PT Start Time  1101    PT Stop Time  1145    PT Time Calculation (min)  44 min    Activity Tolerance  Patient tolerated treatment well    Behavior During Therapy  [facility] for tasks assessed/performed       Past Medical History:  Diagnosis Date  . Acute sinus infection    03-14-2014 current taking Z-Pak-- no fever but non-productive cough  . Benign essential tremor    takes inderal  . Diverticulitis   . Family history of adverse reaction to anesthesia    "sister had episode hypotension"  . History of basal cell carcinoma excision    nose  . History of colon polyps   . History of CVA (cerebrovascular accident) without residual deficits    64-40-3474  embolic right frontal MCA -- found to have PFO and Atrial Septum aneursym /  s/p  closure of PFO  . History of DVT of lower extremity    2003  . History of gastroesophageal reflux (GERD)   . OA (osteoarthritis)    knees and hands  . Patellar clunk syndrome of left knee   . S/P patent foramen ovale closure dx post cva w/ PFO with atrial septal aneursym   07-01-2005  via Intracardiac echo probe with deployment of CardioSEAL septal occluder  . Tendon tear    left gluteal tendon     Past Surgical History:  Procedure Laterality Date  . ABDOMINAL HYSTERECTOMY  1982  . APPENDECTOMY  1960's  . EYE SURGERY  2018   cataract extraction with lens placement    . KNEE ARTHROSCOPY Bilateral left 02-21-2010/  right 11-04-2007   bilateral 2001  . KNEE ARTHROSCOPY Left 03/22/2015   Procedure: LEFT KNEE ARTHROSCOPY AND;   Surgeon: Gaynelle Arabian, MD;  Location: [facility];  Service: Orthopedics;  Laterality: Left;  . LAPAROSCOPIC CHOLECYSTECTOMY  1999  . OPEN SURGICAL REPAIR OF GLUTEAL TENDON Left 03/26/2017   Procedure: OPEN SURGICAL REPAIR OF LEFT GLUTEAL TENDON;  Surgeon: Gaynelle Arabian, MD;  [facility];  Service: Orthopedics;  Laterality: Left;  . PATENT FORAMEN OVALE CLOSURE  07-01-2005  dr Einar Gip   via Intracardiac echocardiogram successful placement CardioSEAL septal occluder (PFO measured about 55m)  . SYNOVECTOMY Left 03/22/2015   Procedure: SYNOVECTOMY;  Surgeon: FGaynelle Arabian MD;  [facility]  Service: Orthopedics;  Laterality: Left;  . TOENAIL TRIMMING    . TONSILLECTOMY  1960's  . TOTAL HIP ARTHROPLASTY Right 04/29/2012   Procedure: TOTAL HIP ARTHROPLASTY ANTERIOR APPROACH;  Surgeon: FGearlean Alf MD;  [facility];  Service: Orthopedics;  Laterality: Right;  . TOTAL KNEE ARTHROPLASTY  12/10/2011   Procedure: TOTAL KNEE ARTHROPLASTY;  Surgeon: FGearlean Alf MD;  [facility];  Service: Orthopedics;  Laterality: Left;  . TOTAL KNEE ARTHROPLASTY Right 08-08-2008    There were no vitals filed for this visit.  Subjective Assessment - 06/30/17 1104    Subjective  "i'm tired and stressed today. My knees are ok"    Currently  in Pain?  No/denies    Pain Score  0-No pain                       [facility] Adult PT Treatment/Exercise - 06/30/17 0001      Knee/Hip Exercises: Stretches   Passive Hamstring Stretch  Both;3 reps;30 seconds    Hip Flexor Stretch  Both;2 reps;30 seconds    ITB Stretch  2 reps;30 seconds    Piriformis Stretch  2 reps;Both;30 seconds      Knee/Hip Exercises: Aerobic   Recumbent Bike  L1x 5 min       Knee/Hip Exercises: Standing   Stairs  3x on stairs  pt fatigued after 3x     Walking with Sports Cord  30lbs  side step 5x each side, and 5x backwards    Other Standing Knee Exercises  eyes open 1x 15 sec,  eyes closed on airex 3x30,  tandem walking               PT Short Term Goals - 05/28/17 1428      PT SHORT TERM GOAL #1   Title  independnet with initial HEP    Status  Achieved        PT Long Term Goals - 06/16/17 1115      PT LONG TERM GOAL #1   Title  walk without difficulty to go shopping    Status  Achieved      PT LONG TERM GOAL #3   Title  report no pain    Status  Partially Met      PT LONG TERM GOAL #4   Title  return to gym and water without difficulty    Status  Achieved            Plan - 06/30/17 1150    Clinical Impression Statement  Pt verbally reports having no pain in her knees. she struggled with closed eyes balance exercises and required CGA with airex balance exercises.    Rehab Potential  Good    PT Frequency  2x / week    PT Duration  4 weeks    PT Treatment/Interventions  ADLs/Self Care Home Management;Cryotherapy;Electrical Stimulation;Moist Heat;Gait training;Stair training;Functional mobility training;Therapeutic activities;Therapeutic exercise;Balance training;Manual techniques;Patient/family education    PT Next Visit Plan  Prepare for discharge       Patient will benefit from skilled therapeutic intervention in order to improve the following deficits and impairments:  Abnormal gait, Decreased range of motion, Difficulty walking, Increased fascial restricitons, Pain, Impaired flexibility, Decreased strength  Visit Diagnosis: Pain in left hip  Difficulty in walking, not elsewhere classified     Problem List Patient Active Problem List   Diagnosis Date Noted  . Trochanteric bursitis of left hip 03/26/2017  . Trochanteric bursitis, left hip 03/26/2017  . 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 OA (osteoarthritis) of hip 04/29/2012  . Postop Acute blood loss anemia 12/12/2011  . Postop Hyponatremia 12/12/2011  . OA (osteoarthritis) of knee 12/10/2011  . ANXIETY STATE, UNSPECIFIED 05/13/2008  . ABDOMINAL PAIN-RUQ 05/13/2008   . DIARRHEA 04/15/2008  . CEREBROVASCULAR ACCIDENT, HX OF 10/23/2007  . HYPERLIPIDEMIA 10/22/2007  . HEMORRHOIDS, INTERNAL 10/22/2007  . EXTERNAL HEMORRHOIDS 10/22/2007  . GERD 10/22/2007  . DIVERTICULOSIS, COLON 10/22/2007  . IBS 10/22/2007  . OSTEOPOROSIS 10/22/2007  . TREMOR 10/22/2007  . COLONIC POLYPS, HX OF 10/22/2007    Loyal Gambler 06/30/2017, 11:55 AM  [facility] [phone] [address]  [facility] [place], [place], [address] Phone: [phone]   Fax:  [phone]  Name: Erica Alvarez MRN: [id number] Date of Birth: February 07, 1946

## 2017-07-02 ENCOUNTER — Encounter: Payer: Self-pay | Admitting: Physical Therapy

## 2017-07-02 ENCOUNTER — Ambulatory Visit: Payer: Medicare Other | Attending: Orthopedic Surgery | Admitting: Physical Therapy

## 2017-07-02 DIAGNOSIS — R262 Difficulty in walking, not elsewhere classified: Secondary | ICD-10-CM | POA: Insufficient documentation

## 2017-07-02 DIAGNOSIS — M25552 Pain in left hip: Secondary | ICD-10-CM | POA: Diagnosis not present

## 2017-07-02 NOTE — Therapy (Signed)
[facility] [place], [place], [address] Phone: [phone]   Fax:  [phone]  Physical Therapy Treatment  Patient Details  Name: Erica Alvarez MRN: [id number] Date of Birth: March 12, 1945 Referring Provider: Wynelle Link   Encounter Date: 07/02/2017  PT End of Session - 07/02/17 1240    Visit Number  13    Date for PT Re-Evaluation  07/21/17    PT Start Time  1147    PT Stop Time  1232    PT Time Calculation (min)  45 min    Activity Tolerance  Patient tolerated treatment well    Behavior During Therapy  [facility] for tasks assessed/performed       Past Medical History:  Diagnosis Date  . Acute sinus infection    03-14-2014 current taking Z-Pak-- no fever but non-productive cough  . Benign essential tremor    takes inderal  . Diverticulitis   . Family history of adverse reaction to anesthesia    "sister had episode hypotension"  . History of basal cell carcinoma excision    nose  . History of colon polyps   . History of CVA (cerebrovascular accident) without residual deficits    [phone]  embolic right frontal MCA -- found to have PFO and Atrial Septum aneursym /  s/p  closure of PFO  . History of DVT of lower extremity    2003  . History of gastroesophageal reflux (GERD)   . OA (osteoarthritis)    knees and hands  . Patellar clunk syndrome of left knee   . S/P patent foramen ovale closure dx post cva w/ PFO with atrial septal aneursym   07-01-2005  via Intracardiac echo probe with deployment of CardioSEAL septal occluder  . Tendon tear    left gluteal tendon     Past Surgical History:  Procedure Laterality Date  . ABDOMINAL HYSTERECTOMY  1982  . APPENDECTOMY  1960's  . EYE SURGERY  2018   cataract extraction with lens placement    . KNEE ARTHROSCOPY Bilateral left 02-21-2010/  right 11-04-2007   bilateral 2001  . KNEE ARTHROSCOPY Left 03/22/2015   Procedure: LEFT KNEE ARTHROSCOPY AND;   Surgeon: Gaynelle Arabian, MD;  Location: [facility];  Service: Orthopedics;  Laterality: Left;  . LAPAROSCOPIC CHOLECYSTECTOMY  1999  . OPEN SURGICAL REPAIR OF GLUTEAL TENDON Left 03/26/2017   Procedure: OPEN SURGICAL REPAIR OF LEFT GLUTEAL TENDON;  Surgeon: Gaynelle Arabian, MD;  Location: [facility];  Service: Orthopedics;  Laterality: Left;  . PATENT FORAMEN OVALE CLOSURE  07-01-2005  dr Einar Gip   via Intracardiac echocardiogram successful placement CardioSEAL septal occluder (PFO measured about 81m)  . SYNOVECTOMY Left 03/22/2015   Procedure: SYNOVECTOMY;  Surgeon: FGaynelle Arabian MD;  [facility]  Service: Orthopedics;  Laterality: Left;  . TOENAIL TRIMMING    . TONSILLECTOMY  1960's  . TOTAL HIP ARTHROPLASTY Right 04/29/2012   Procedure: TOTAL HIP ARTHROPLASTY ANTERIOR APPROACH;  Surgeon: FGearlean Alf MD;  Location: [facility];  Service: Orthopedics;  Laterality: Right;  . TOTAL KNEE ARTHROPLASTY  12/10/2011   Procedure: TOTAL KNEE ARTHROPLASTY;  Surgeon: FGearlean Alf MD;  Location: [facility];  Service: Orthopedics;  Laterality: Left;  . TOTAL KNEE ARTHROPLASTY Right 08-08-2008    There were no vitals filed for this visit.  Subjective Assessment - 07/02/17 1149    Subjective  "I'm sore from water aerobics. I'm doing ok"    Currently in  Pain?  No/denies    Pain Score  0-No pain                       [facility] Adult PT Treatment/Exercise - 07/02/17 0001      Knee/Hip Exercises: Stretches   Passive Hamstring Stretch  Both;2 reps;20 seconds    Hip Flexor Stretch  Both;2 reps;20 seconds    Piriformis Stretch  2 reps;20 seconds      Knee/Hip Exercises: Aerobic   Elliptical  I5 R3 x 3 min    Recumbent Bike  L1x 5 min       Knee/Hip Exercises: Machines for Strengthening   Cybex Knee Extension  5lb 2x15    Cybex Knee Flexion  25lb 2x15    Cybex Leg Press  30lb 2x15, 40lb 1x10      Knee/Hip Exercises: Standing   Lateral Step Up  Both;1  set;10 reps    Stairs  2x stairs    SLS  3x on airex    Other Standing Knee Exercises  5x eyes closed on airex 15 sec               PT Short Term Goals - 05/28/17 1428      PT SHORT TERM GOAL #1   Title  independnet with initial HEP    Status  Achieved        PT Long Term Goals - 07/02/17 1156      PT LONG TERM GOAL #1   Title  walk without difficulty to go shopping    Status  Achieved      PT LONG TERM GOAL #2   Title  go up and down stairs step over step    Status  Achieved      PT LONG TERM GOAL #3   Title  report no pain    Status  Achieved            Plan - 07/02/17 1241    Clinical Impression Statement  Pt verbally reports that she is sore from water aerobics. she fatigued easily with the ellipical and lateral step up exercises. She needed HHA assist to do single leg stance on airex pad.  Pt has progressed and accomplished all goals.    Rehab Potential  Good    PT Frequency  2x / week    PT Duration  4 weeks    PT Treatment/Interventions  ADLs/Self Care Home Management;Cryotherapy;Electrical Stimulation;Moist Heat;Gait training;Stair training;Functional mobility training;Therapeutic activities;Therapeutic exercise;Balance training;Manual techniques;Patient/family education    PT Next Visit Plan  Discharged       Patient will benefit from skilled therapeutic intervention in order to improve the following deficits and impairments:  Abnormal gait, Decreased range of motion, Difficulty walking, Increased fascial restricitons, Pain, Impaired flexibility, Decreased strength  Visit Diagnosis: Pain in left hip  Difficulty in walking, not elsewhere classified     Problem List Patient Active Problem List   Diagnosis Date Noted  . Trochanteric bursitis of left hip 03/26/2017  . Trochanteric bursitis, left hip 03/26/2017  . 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 OA (osteoarthritis) of hip 04/29/2012  . Postop Acute blood loss anemia 12/12/2011  .  Postop Hyponatremia 12/12/2011  . OA (osteoarthritis) of knee 12/10/2011  . ANXIETY STATE, UNSPECIFIED 05/13/2008  . ABDOMINAL PAIN-RUQ 05/13/2008  . DIARRHEA 04/15/2008  . CEREBROVASCULAR ACCIDENT, HX OF 10/23/2007  . HYPERLIPIDEMIA 10/22/2007  . HEMORRHOIDS, INTERNAL 10/22/2007  . EXTERNAL HEMORRHOIDS 10/22/2007  . GERD 10/22/2007  .  DIVERTICULOSIS, COLON 10/22/2007  . IBS 10/22/2007  . OSTEOPOROSIS 10/22/2007  . TREMOR 10/22/2007  . COLONIC POLYPS, HX OF 10/22/2007   PHYSICAL THERAPY DISCHARGE SUMMARY  Visits from Start of Care: 13 Plan: Patient agrees to discharge.  Patient goals were met. Patient is being discharged due to meeting the stated rehab goals.  ?????       Loyal Gambler 07/02/2017, 12:45 PM  [address] [place], [place], [address] Phone: [phone]   Fax:  [phone]  Name: Erica Alvarez MRN: [id number] Date of Birth: 01-03-1946

## 2017-07-23 ENCOUNTER — Encounter: Payer: Self-pay | Admitting: Gastroenterology

## 2017-07-23 DIAGNOSIS — F4321 Adjustment disorder with depressed mood: Secondary | ICD-10-CM | POA: Diagnosis not present

## 2017-07-30 ENCOUNTER — Encounter: Payer: Self-pay | Admitting: Gastroenterology

## 2017-08-05 DIAGNOSIS — H6123 Impacted cerumen, bilateral: Secondary | ICD-10-CM | POA: Diagnosis not present

## 2017-08-05 DIAGNOSIS — J302 Other seasonal allergic rhinitis: Secondary | ICD-10-CM | POA: Insufficient documentation

## 2017-08-05 DIAGNOSIS — M2669 Other specified disorders of temporomandibular joint: Secondary | ICD-10-CM | POA: Diagnosis not present

## 2017-08-05 DIAGNOSIS — Z87891 Personal history of nicotine dependence: Secondary | ICD-10-CM | POA: Diagnosis not present

## 2017-08-13 DIAGNOSIS — L309 Dermatitis, unspecified: Secondary | ICD-10-CM | POA: Diagnosis not present

## 2017-08-13 DIAGNOSIS — N760 Acute vaginitis: Secondary | ICD-10-CM | POA: Diagnosis not present

## 2017-08-13 DIAGNOSIS — R350 Frequency of micturition: Secondary | ICD-10-CM | POA: Diagnosis not present

## 2017-09-11 DIAGNOSIS — N61 Mastitis without abscess: Secondary | ICD-10-CM | POA: Diagnosis not present

## 2017-09-26 DIAGNOSIS — L0390 Cellulitis, unspecified: Secondary | ICD-10-CM | POA: Diagnosis not present

## 2017-10-08 ENCOUNTER — Ambulatory Visit (AMBULATORY_SURGERY_CENTER): Payer: Self-pay

## 2017-10-08 VITALS — Ht 60.0 in | Wt 172.0 lb

## 2017-10-08 DIAGNOSIS — Z86010 Personal history of colonic polyps: Secondary | ICD-10-CM

## 2017-10-08 DIAGNOSIS — Z800 Family history of malignant neoplasm of digestive organs: Secondary | ICD-10-CM

## 2017-10-08 MED ORDER — PEG 3350-KCL-NA BICARB-NACL 420 G PO SOLR: 4000.0000 mL | Freq: Once | ORAL | 0 refills | Status: AC

## 2017-10-08 NOTE — Progress Notes (Signed)
Per pt, no allergies to soy or egg products.Pt not taking any weight loss meds or using  O2 at home.  Pt refused emmi video. 

## 2017-10-22 ENCOUNTER — Encounter: Payer: Self-pay | Admitting: Gastroenterology

## 2017-10-22 ENCOUNTER — Ambulatory Visit (AMBULATORY_SURGERY_CENTER): Payer: Medicare Other | Admitting: Gastroenterology

## 2017-10-22 VITALS — BP 103/67 | HR 76 | Temp 97.9°F | Resp 20 | Ht 60.0 in | Wt 172.0 lb

## 2017-10-22 DIAGNOSIS — Z800 Family history of malignant neoplasm of digestive organs: Secondary | ICD-10-CM | POA: Diagnosis not present

## 2017-10-22 DIAGNOSIS — Z1211 Encounter for screening for malignant neoplasm of colon: Secondary | ICD-10-CM | POA: Diagnosis not present

## 2017-10-22 DIAGNOSIS — Z86010 Personal history of colonic polyps: Secondary | ICD-10-CM | POA: Diagnosis not present

## 2017-10-22 DIAGNOSIS — E785 Hyperlipidemia, unspecified: Secondary | ICD-10-CM | POA: Diagnosis not present

## 2017-10-22 MED ORDER — SODIUM CHLORIDE 0.9 % IV SOLN: 500.0000 mL | Freq: Once | INTRAVENOUS | Status: DC

## 2017-10-22 NOTE — Progress Notes (Signed)
Report to PACU, RN, vss, BBS= Clear.  

## 2017-10-22 NOTE — Progress Notes (Signed)
I have reviewed the patient's medical history in detail and updated the computerized patient record.

## 2017-10-22 NOTE — Patient Instructions (Signed)
YOU HAD AN ENDOSCOPIC PROCEDURE TODAY AT [facility]:   Refer to the procedure report that was given to you for any specific questions about what was found during the examination.  If the procedure report does not answer your questions, please call your gastroenterologist to clarify.  If you requested that your care partner not be given the details of your procedure findings, then the procedure report has been included in a sealed envelope for you to review at your convenience later.  YOU SHOULD EXPECT: Some feelings of bloating in the abdomen. Passage of more gas than usual.  Walking can help get rid of the air that was put into your GI tract during the procedure and reduce the bloating. If you had a lower endoscopy (such as a colonoscopy or flexible sigmoidoscopy) you may notice spotting of blood in your stool or on the toilet paper. If you underwent a bowel prep for your procedure, you may not have a normal bowel movement for a few days.  Please Note:  You might notice some irritation and congestion in your nose or some drainage.  This is from the oxygen used during your procedure.  There is no need for concern and it should clear up in a day or so.  SYMPTOMS TO REPORT IMMEDIATELY:   Following lower endoscopy (colonoscopy or flexible sigmoidoscopy):  Excessive amounts of blood in the stool  Significant tenderness or worsening of abdominal pains  Swelling of the abdomen that is new, acute  Fever of 100F or higher  For urgent or emergent issues, a gastroenterologist can be reached at any hour by calling [phone].   DIET:  We do recommend a small meal at first, but then you may proceed to your regular diet.  Drink plenty of fluids but you should avoid alcoholic beverages for 24 hours.  ACTIVITY:  You should plan to take it easy for the rest of today and you should NOT DRIVE or use heavy machinery until tomorrow (because of the sedation medicines used during the test).     FOLLOW UP: Our staff will call the number listed on your records the next business day following your procedure to check on you and address any questions or concerns that you may have regarding the information given to you following your procedure. If we do not reach you, we will leave a message.  However, if you are feeling well and you are not experiencing any problems, there is no need to return our call.  We will assume that you have returned to your regular daily activities without incident.  If any biopsies were taken you will be contacted by phone or by letter within the next 1-3 weeks.  Please call us at [phone] if you have not heard about the biopsies in 3 weeks.   Hemorrhoids (handout given) Diverticulosis (handout given) High Fiber Diet (handout given) Repeat next screening Colonoscopy in 5 years  SIGNATURES/CONFIDENTIALITY: You and/or your care partner have signed paperwork which will be entered into your electronic medical record.  These signatures attest to the fact that that the information above on your After Visit Summary has been reviewed and is understood.  Full responsibility of the confidentiality of this discharge information lies with you and/or your care-partner.

## 2017-10-22 NOTE — Op Note (Signed)
[place] Patient Name: Erica Alvarez Procedure Date: 10/22/2017 8:00 AM MRN: [id number] Endoscopist: Ladene Artist , MD Age: 72 Referring MD:  Date of Birth: 03-26-1945 Gender: Female Account #: [id number] Procedure:                Colonoscopy Indications:              Screening in patient at increased risk: Family                            history of 1st-degree relative with colorectal                            cancer Medicines:                Monitored Anesthesia Care Procedure:                Pre-Anesthesia Assessment:                           - Prior to the procedure, a History and Physical                            was performed, and patient medications and                            allergies were reviewed. The patient's tolerance of                            previous anesthesia was also reviewed. The risks                            and benefits of the procedure and the sedation                            options and risks were discussed with the patient.                            All questions were answered, and informed consent                            was obtained. Prior Anticoagulants: The patient has                            taken no previous anticoagulant or antiplatelet                            agents. ASA Grade Assessment: II - A patient with                            mild systemic disease. After reviewing the risks                            and benefits, the patient was deemed in  satisfactory condition to undergo the procedure.                           After obtaining informed consent, the colonoscope                            was passed under direct vision. Throughout the                            procedure, the patient's blood pressure, pulse, and                            oxygen saturations were monitored continuously. The                            Model PCF-[id number]) scope was introduced                             through the anus and advanced to the the cecum,                            identified by appendiceal orifice and ileocecal                            valve. The ileocecal valve, appendiceal orifice,                            and rectum were photographed. The quality of the                            bowel preparation was good. The colonoscopy was                            performed without difficulty. The patient tolerated                            the procedure well. Scope In: 8:04:45 AM Scope Out: 8:19:37 AM Scope Withdrawal Time: 0 hours 11 minutes 20 seconds  Total Procedure Duration: 0 hours 14 minutes 52 seconds  Findings:                 The perianal and digital rectal examinations were                            normal.                           Multiple medium-mouthed diverticula were found in                            the left colon. There was no evidence of                            diverticular bleeding.  Internal hemorrhoids were found during                            retroflexion. The hemorrhoids were medium-sized and                            Grade I (internal hemorrhoids that do not prolapse).                           The exam was otherwise without abnormality on                            direct and retroflexion views. Complications:            No immediate complications. Estimated blood loss:                            None. Estimated Blood Loss:     Estimated blood loss: none. Impression:               - Moderate diverticulosis in the left colon. There                            was no evidence of diverticular bleeding.                           - Internal hemorrhoids.                           - The examination was otherwise normal on direct                            and retroflexion views.                           - No specimens collected. Recommendation:           - Repeat colonoscopy in 5 years for screening                             purposes.                           - Patient has a contact number available for                            emergencies. The signs and symptoms of potential                            delayed complications were discussed with the                            patient. Return to normal activities tomorrow.                            Written discharge instructions were provided to the  patient.                           - High fiber diet.                           - Continue present medications. Ladene Artist, MD 10/22/2017 8:26:32 AM This report has been signed electronically.

## 2017-10-23 ENCOUNTER — Telehealth: Payer: Self-pay | Admitting: *Deleted

## 2017-10-23 NOTE — Telephone Encounter (Signed)
  Follow up Call-  Call back number 10/22/2017  Post procedure Call Back phone  # [phone]  Permission to leave phone message Yes  Some recent data might be hidden     Patient questions:  Do you have a fever, pain , or abdominal swelling? No. Pain Score  0 *  Have you tolerated food without any problems? Yes.    Have you been able to return to your normal activities? Yes.    Do you have any questions about your discharge instructions: Diet   No. Medications  No. Follow up visit  No.  Do you have questions or concerns about your Care? No.  Actions: * If pain score is 4 or above: No action needed, pain <4.

## 2017-10-23 NOTE — Telephone Encounter (Signed)
No answer for post procedure call back. LEft message and will attempt to call back later this afternoon. SM 

## 2017-10-27 DIAGNOSIS — L720 Epidermal cyst: Secondary | ICD-10-CM | POA: Diagnosis not present

## 2017-10-27 DIAGNOSIS — L820 Inflamed seborrheic keratosis: Secondary | ICD-10-CM | POA: Diagnosis not present

## 2017-10-27 DIAGNOSIS — L570 Actinic keratosis: Secondary | ICD-10-CM | POA: Diagnosis not present

## 2017-10-27 DIAGNOSIS — D225 Melanocytic nevi of trunk: Secondary | ICD-10-CM | POA: Diagnosis not present

## 2017-10-27 DIAGNOSIS — Z1283 Encounter for screening for malignant neoplasm of skin: Secondary | ICD-10-CM | POA: Diagnosis not present

## 2017-10-27 DIAGNOSIS — X32XXXA Exposure to sunlight, initial encounter: Secondary | ICD-10-CM | POA: Diagnosis not present

## 2017-11-23 ENCOUNTER — Other Ambulatory Visit: Payer: Self-pay | Admitting: Physician Assistant

## 2017-12-08 DIAGNOSIS — G250 Essential tremor: Secondary | ICD-10-CM | POA: Diagnosis not present

## 2017-12-08 DIAGNOSIS — Z85828 Personal history of other malignant neoplasm of skin: Secondary | ICD-10-CM | POA: Diagnosis not present

## 2017-12-08 DIAGNOSIS — Z23 Encounter for immunization: Secondary | ICD-10-CM | POA: Diagnosis not present

## 2017-12-08 DIAGNOSIS — Z1389 Encounter for screening for other disorder: Secondary | ICD-10-CM | POA: Diagnosis not present

## 2017-12-08 DIAGNOSIS — K589 Irritable bowel syndrome without diarrhea: Secondary | ICD-10-CM | POA: Diagnosis not present

## 2017-12-08 DIAGNOSIS — Z8673 Personal history of transient ischemic attack (TIA), and cerebral infarction without residual deficits: Secondary | ICD-10-CM | POA: Diagnosis not present

## 2017-12-08 DIAGNOSIS — E782 Mixed hyperlipidemia: Secondary | ICD-10-CM | POA: Diagnosis not present

## 2017-12-08 DIAGNOSIS — Z0000 Encounter for general adult medical examination without abnormal findings: Secondary | ICD-10-CM | POA: Diagnosis not present

## 2017-12-08 DIAGNOSIS — E559 Vitamin D deficiency, unspecified: Secondary | ICD-10-CM | POA: Diagnosis not present

## 2017-12-08 DIAGNOSIS — M8588 Other specified disorders of bone density and structure, other site: Secondary | ICD-10-CM | POA: Diagnosis not present

## 2017-12-08 DIAGNOSIS — R5383 Other fatigue: Secondary | ICD-10-CM | POA: Diagnosis not present

## 2017-12-08 DIAGNOSIS — R7303 Prediabetes: Secondary | ICD-10-CM | POA: Diagnosis not present

## 2017-12-17 DIAGNOSIS — M5416 Radiculopathy, lumbar region: Secondary | ICD-10-CM | POA: Diagnosis not present

## 2017-12-22 DIAGNOSIS — Z961 Presence of intraocular lens: Secondary | ICD-10-CM | POA: Diagnosis not present

## 2017-12-22 DIAGNOSIS — H02831 Dermatochalasis of right upper eyelid: Secondary | ICD-10-CM | POA: Diagnosis not present

## 2017-12-22 DIAGNOSIS — H02834 Dermatochalasis of left upper eyelid: Secondary | ICD-10-CM | POA: Diagnosis not present

## 2017-12-30 DIAGNOSIS — M5136 Other intervertebral disc degeneration, lumbar region: Secondary | ICD-10-CM | POA: Diagnosis not present

## 2017-12-30 DIAGNOSIS — M51369 Other intervertebral disc degeneration, lumbar region without mention of lumbar back pain or lower extremity pain: Secondary | ICD-10-CM | POA: Insufficient documentation

## 2018-01-20 DIAGNOSIS — J019 Acute sinusitis, unspecified: Secondary | ICD-10-CM | POA: Diagnosis not present

## 2018-01-23 DIAGNOSIS — R05 Cough: Secondary | ICD-10-CM | POA: Diagnosis not present

## 2018-02-05 DIAGNOSIS — K219 Gastro-esophageal reflux disease without esophagitis: Secondary | ICD-10-CM | POA: Insufficient documentation

## 2018-02-05 DIAGNOSIS — R1310 Dysphagia, unspecified: Secondary | ICD-10-CM | POA: Insufficient documentation

## 2018-02-05 DIAGNOSIS — Z9089 Acquired absence of other organs: Secondary | ICD-10-CM | POA: Diagnosis not present

## 2018-02-05 DIAGNOSIS — H6123 Impacted cerumen, bilateral: Secondary | ICD-10-CM | POA: Diagnosis not present

## 2018-02-05 DIAGNOSIS — Z7289 Other problems related to lifestyle: Secondary | ICD-10-CM | POA: Diagnosis not present

## 2018-02-05 DIAGNOSIS — Z87891 Personal history of nicotine dependence: Secondary | ICD-10-CM | POA: Diagnosis not present

## 2018-02-05 DIAGNOSIS — M79645 Pain in left finger(s): Secondary | ICD-10-CM | POA: Diagnosis not present

## 2018-02-27 DIAGNOSIS — L820 Inflamed seborrheic keratosis: Secondary | ICD-10-CM | POA: Diagnosis not present

## 2018-02-27 DIAGNOSIS — L308 Other specified dermatitis: Secondary | ICD-10-CM | POA: Diagnosis not present

## 2018-04-16 ENCOUNTER — Encounter: Payer: Self-pay | Admitting: Podiatry

## 2018-04-16 ENCOUNTER — Ambulatory Visit (INDEPENDENT_AMBULATORY_CARE_PROVIDER_SITE_OTHER): Payer: Medicare Other | Admitting: Podiatry

## 2018-04-16 ENCOUNTER — Ambulatory Visit (INDEPENDENT_AMBULATORY_CARE_PROVIDER_SITE_OTHER): Payer: Medicare Other

## 2018-04-16 VITALS — BP 133/81 | HR 68 | Resp 16

## 2018-04-16 DIAGNOSIS — M7751 Other enthesopathy of right foot: Secondary | ICD-10-CM | POA: Diagnosis not present

## 2018-04-16 DIAGNOSIS — M7750 Other enthesopathy of unspecified foot: Secondary | ICD-10-CM | POA: Diagnosis not present

## 2018-04-16 DIAGNOSIS — M722 Plantar fascial fibromatosis: Secondary | ICD-10-CM | POA: Diagnosis not present

## 2018-04-16 DIAGNOSIS — M7752 Other enthesopathy of left foot: Secondary | ICD-10-CM | POA: Diagnosis not present

## 2018-04-16 MED ORDER — PREDNISONE 10 MG PO TABS: ORAL_TABLET | ORAL | 0 refills | Status: DC

## 2018-04-16 NOTE — Progress Notes (Signed)
Subjective:  Patient ID: Erica Alvarez, female    DOB: 1945-07-22,  MRN: [id number] HPI Chief Complaint  Patient presents with  . Foot Pain    Dorsal forefoot/midfoot/anterior ankle bilateral (R>L) - aching x 3-4 months, really severe yesterday-could barely bear weight, tried soaking and ice, no injury  . New Patient (Initial Visit)    73 y.o. female presents with the above complaint.   ROS: Denies fever chills nausea vomiting muscle aches pains calf pain back pain chest pain shortness of breath.  Past Medical History:  Diagnosis Date  . Acute sinus infection    03-14-2014 current taking Z-Pak-- no fever but non-productive cough  . Benign essential tremor    takes inderal  . Cancer (HCC)    skin cancer 2 times  . Diverticulitis   . Family history of adverse reaction to anesthesia    "sister had episode hypotension"  . History of basal cell carcinoma excision    nose  . History of colon polyps   . History of CVA (cerebrovascular accident) without residual deficits    03-15-2005  embolic right frontal MCA -- found to have PFO and Atrial Septum aneursym /  s/p  closure of PFO  . History of DVT of lower extremity    2003/left leg  . History of gastroesophageal reflux (GERD)   . Hyperlipidemia   . OA (osteoarthritis)    knees and hands  . Patellar clunk syndrome of left knee   . S/P patent foramen ovale closure dx post cva w/ PFO with atrial septal aneursym   07-01-2005  via Intracardiac echo probe with deployment of CardioSEAL septal occluder  . Stroke [facility]) 2007   due to patent foramen ovale  . Tendon tear    left gluteal tendon    Past Surgical History:  Procedure Laterality Date  . ABDOMINAL HYSTERECTOMY  1982  . APPENDECTOMY  1960's  . EYE SURGERY  2018   cataract extraction with lens placement  /Bil  . KNEE ARTHROSCOPY Bilateral left 02-22-2012/  right 11-03-2008   bilateral 2001  . KNEE ARTHROSCOPY Left 03/22/2015   Procedure: LEFT KNEE ARTHROSCOPY AND;   Surgeon: Ollen Gross, MD;  Location: [facility];  Service: Orthopedics;  Laterality: Left;  . LAPAROSCOPIC CHOLECYSTECTOMY  1999  . OPEN SURGICAL REPAIR OF GLUTEAL TENDON Left 03/26/2017   Procedure: OPEN SURGICAL REPAIR OF LEFT GLUTEAL TENDON;  Surgeon: Ollen Gross, MD;  Location: [facility];  Service: Orthopedics;  Laterality: Left;  . PATENT FORAMEN OVALE CLOSURE  07-01-2005  dr Jacinto Halim   via Intracardiac echocardiogram successful placement CardioSEAL septal occluder (PFO measured about 14mm)  . SYNOVECTOMY Left 03/22/2015   Procedure: SYNOVECTOMY;  Surgeon: Ollen Gross, MD;  Location: [facility];  Service: Orthopedics;  Laterality: Left;  . TOENAIL TRIMMING    . TONSILLECTOMY  1960's  . TOTAL HIP ARTHROPLASTY Right 04/29/2012   Procedure: TOTAL HIP ARTHROPLASTY ANTERIOR APPROACH;  Surgeon: Loanne Drilling, MD;  Location: [facility];  Service: Orthopedics;  Laterality: Right;  . TOTAL KNEE ARTHROPLASTY  12/10/2011   Procedure: TOTAL KNEE ARTHROPLASTY;  Surgeon: Loanne Drilling, MD;  Location: [facility];  Service: Orthopedics;  Laterality: Left;  . TOTAL KNEE ARTHROPLASTY Right 08-08-2008    Current Outpatient Medications:  .  aspirin 81 MG tablet, Take 81 mg by mouth daily., Disp: , Rfl:  .  Calcium Carbonate-Vitamin D (CALTRATE 600+D PO), Take 1 tablet by mouth 2 (two) times daily. , Disp: , Rfl:  .  cetirizine (ZYRTEC) 10 MG tablet, Take 10 mg by mouth daily., Disp: , Rfl:  .  fenofibrate micronized (LOFIBRA) 67 MG capsule, Take by mouth daily., Disp: , Rfl:  .  Multiple Vitamin (MULTIVITAMIN) tablet, Take 1 tablet by mouth daily., Disp: , Rfl:  .  Omega-3 Fatty Acids (EQL OMEGA 3 FISH OIL) 1400 MG CAPS, Take 1,400 mg by mouth daily., Disp: , Rfl:  .  omeprazole (PRILOSEC) 40 MG capsule, TAKE 1 CAPSULE TWICE DAILY, 30 MINUTES BEFORE MORNING AND EVENING MEALS., Disp: , Rfl:  .  predniSONE (DELTASONE) 10 MG tablet, 6 day tapering dose, Disp: 21 tablet, Rfl: 0 .   Probiotic Product (PHILLIPS COLON HEALTH PO), Take 1 capsule by mouth daily., Disp: , Rfl:  .  propranolol (INDERAL) 10 MG tablet, Take 10 mg by mouth 2 (two) times daily. , Disp: , Rfl:   Current Facility-Administered Medications:  .  0.9 %  sodium chloride infusion, 500 mL, Intravenous, Once, Meryl Dare, MD  Allergies  Allergen Reactions  . Hydrocodone Itching  . Medrol [Methylprednisolone] Other (See Comments)    Disorientation/ hot flashes  . Penicillins Itching, Nausea And Vomiting and Other (See Comments)    Has patient had a PCN reaction causing immediate rash, facial/tongue/throat swelling, SOB or lightheadedness with hypotension: No Has patient had a PCN reaction causing severe rash involving mucus membranes or skin necrosis: No Has patient had a PCN reaction that required hospitalization: No Has patient had a PCN reaction occurring within the last 10 years: No If all of the above answers are "NO", then may proceed with Cephalosporin use.   . Sulfa Antibiotics Nausea And Vomiting    itching  . Sulfasalazine Nausea And Vomiting    itching  . Tetracycline Itching and Nausea And Vomiting   Review of Systems Objective:   Vitals:   04/16/18 1414  BP: 133/81  Pulse: 68  Resp: 16    General: Well developed, nourished, in no acute distress, alert and oriented x3   Dermatological: Skin is warm, dry and supple bilateral. Nails x 10 are well maintained; remaining integument appears unremarkable at this time. There are no open sores, no preulcerative lesions, no rash or signs of infection present.  Vascular: Dorsalis Pedis artery and Posterior Tibial artery pedal pulses are 2/4 bilateral with immedate capillary fill time. Pedal hair growth present. No varicosities and no lower extremity edema present bilateral.   Neruologic: Grossly intact via light touch bilateral. Vibratory intact via tuning fork bilateral. Protective threshold with Semmes Wienstein monofilament intact to  all pedal sites bilateral. Patellar and Achilles deep tendon reflexes 2+ bilateral. No Babinski or clonus noted bilateral.   Musculoskeletal: No gross boney pedal deformities bilateral. No pain, crepitus, or limitation noted with foot and ankle range of motion bilateral. Muscular strength 5/5 in all groups tested bilateral.  Gait: Unassisted, Nonantalgic.    Radiographs:  Radiographs taken today demonstrate some osteoarthritic changes dorsal aspect of the right foot as well as the soft tissue increase in density plantar fat-containing insertion site of the right foot.  Assessment & Plan:   Assessment: Capsulitis dorsal aspect of the right foot due to osteoarthritis.  Plan fasciitis right foot.  Plan: Discussed etiology pathology and surgical therapies this point time injected the right heel discussed appropriate shoe gear stretching excise ice therapy shoe modification injected the dorsal aspect of the foot both with triamcinolone after sterile Betadine skin prep and local anesthetic.  Tolerated procedure well without complications.  Also put her  on methylprednisolone.  She states that she cannot take nonsteroidals.     Adama Ivins T. [place], [place]

## 2018-04-20 ENCOUNTER — Telehealth: Payer: Self-pay | Admitting: Podiatry

## 2018-04-20 NOTE — Telephone Encounter (Signed)
Pt left message asking about seeing if she would qualify  for medicare shoes...  I returned call and left message after looking in chart I do not see where she is diabetic and that is what qualifies for the shoes. If she is diabetic she can discuss at the follow up appt and we could go from there.

## 2018-04-27 DIAGNOSIS — D225 Melanocytic nevi of trunk: Secondary | ICD-10-CM | POA: Diagnosis not present

## 2018-04-27 DIAGNOSIS — L308 Other specified dermatitis: Secondary | ICD-10-CM | POA: Diagnosis not present

## 2018-04-27 DIAGNOSIS — Z1283 Encounter for screening for malignant neoplasm of skin: Secondary | ICD-10-CM | POA: Diagnosis not present

## 2018-05-11 DIAGNOSIS — Z90710 Acquired absence of both cervix and uterus: Secondary | ICD-10-CM | POA: Diagnosis not present

## 2018-05-11 DIAGNOSIS — Z8262 Family history of osteoporosis: Secondary | ICD-10-CM | POA: Diagnosis not present

## 2018-05-11 DIAGNOSIS — M8589 Other specified disorders of bone density and structure, multiple sites: Secondary | ICD-10-CM | POA: Diagnosis not present

## 2018-05-11 DIAGNOSIS — Z1231 Encounter for screening mammogram for malignant neoplasm of breast: Secondary | ICD-10-CM | POA: Diagnosis not present

## 2018-05-14 ENCOUNTER — Ambulatory Visit: Payer: Medicare Other | Admitting: Podiatry

## 2018-05-14 ENCOUNTER — Encounter: Payer: Self-pay | Admitting: Podiatry

## 2018-05-14 ENCOUNTER — Other Ambulatory Visit: Payer: Self-pay

## 2018-05-14 DIAGNOSIS — M778 Other enthesopathies, not elsewhere classified: Secondary | ICD-10-CM

## 2018-05-14 DIAGNOSIS — M722 Plantar fascial fibromatosis: Secondary | ICD-10-CM

## 2018-05-14 DIAGNOSIS — M779 Enthesopathy, unspecified: Secondary | ICD-10-CM

## 2018-05-14 DIAGNOSIS — M79676 Pain in unspecified toe(s): Secondary | ICD-10-CM

## 2018-05-14 NOTE — Progress Notes (Signed)
She presents today chief complaint of pain to the plantar aspect of her right heel and the dorsal aspect of the left foot states the injection on the top of the right foot last time helped a lot.  She is wondering if she could have another injection.  Objective: Vital signs are stable she is alert and oriented x3.  Pulses are palpable.  She has pain on palpation medial [place] tubercle of the right heel no pain on palpation of the dorsal aspect of the right foot she does have considerable pain along the area of nodularity to the dorsal aspect of the left foot left foot along Lisfranc's joints.  Assessment: Arthritic capsulitis Lisfranc's joints left.  Plantar fasciitis right.  Plan: I injected the right heel today with 20 mg Kenalog 5 mg Marcaine point of maximal tenderness.  Dorsal aspect of the left foot was injected with the same.  I will follow-up with her in 1 month.  I did take her to [facility] to see if we can rebuild her orthotics or have her new pair made.

## 2018-05-26 DIAGNOSIS — E782 Mixed hyperlipidemia: Secondary | ICD-10-CM | POA: Diagnosis not present

## 2018-05-26 DIAGNOSIS — E559 Vitamin D deficiency, unspecified: Secondary | ICD-10-CM | POA: Diagnosis not present

## 2018-05-26 DIAGNOSIS — R7303 Prediabetes: Secondary | ICD-10-CM | POA: Diagnosis not present

## 2018-06-16 DIAGNOSIS — M8588 Other specified disorders of bone density and structure, other site: Secondary | ICD-10-CM | POA: Diagnosis not present

## 2018-06-17 ENCOUNTER — Telehealth: Payer: Self-pay | Admitting: Podiatry

## 2018-07-02 DIAGNOSIS — M25552 Pain in left hip: Secondary | ICD-10-CM | POA: Diagnosis not present

## 2018-07-06 DIAGNOSIS — R55 Syncope and collapse: Secondary | ICD-10-CM | POA: Diagnosis not present

## 2018-07-13 ENCOUNTER — Telehealth: Payer: Medicare Other | Admitting: Cardiology

## 2018-07-14 ENCOUNTER — Encounter: Payer: Self-pay | Admitting: Cardiology

## 2018-07-14 ENCOUNTER — Telehealth: Payer: Self-pay | Admitting: *Deleted

## 2018-07-14 ENCOUNTER — Other Ambulatory Visit: Payer: Self-pay

## 2018-07-14 ENCOUNTER — Ambulatory Visit (INDEPENDENT_AMBULATORY_CARE_PROVIDER_SITE_OTHER): Payer: Medicare Other | Admitting: Cardiology

## 2018-07-14 VITALS — BP 130/86 | HR 61 | Ht 60.0 in | Wt 171.0 lb

## 2018-07-14 DIAGNOSIS — R55 Syncope and collapse: Secondary | ICD-10-CM | POA: Diagnosis not present

## 2018-07-14 NOTE — Patient Instructions (Signed)
Medication Instructions:  The current medical regimen is effective;  continue present plan and medications.  If you need a refill on your cardiac medications before your next appointment, please call your pharmacy.   Testing/Procedures: Your physician has requested that you have an echocardiogram. Echocardiography is a painless test that uses sound waves to create images of your heart. It provides your doctor with information about the size and shape of your heart and how well your heart's chambers and valves are working. This procedure takes approximately one hour. There are no restrictions for this procedure.  Your physician has requested that you have a carotid duplex. This test is an ultrasound of the carotid arteries in your neck. It looks at blood flow through these arteries that supply the brain with blood. Allow one hour for this exam. There are no restrictions or special instructions.  Your physician has recommended that you wear a holter monitor. Holter monitors are medical devices that record the heart's electrical activity. Doctors most often use these monitors to diagnose arrhythmias. Arrhythmias are problems with the speed or rhythm of the heartbeat. The monitor is a small, portable device. You can wear one while you do your normal daily activities. This is usually used to diagnose what is causing palpitations/syncope (passing out).  Follow-Up: Follow up as needed after the above testing.  Thank you for choosing [facility]!!

## 2018-07-14 NOTE — Telephone Encounter (Signed)
[employer] to mail 14 day ZIO XT long term holter monitor to patients home.  Instructions reviewed briefly as they are included in kit.

## 2018-07-14 NOTE — Progress Notes (Signed)
Cardiology Office Note:    Date:  07/14/2018   ID:  Erica Alvarez, DOB 1945/08/17, MRN [id number]  PCP:  Leighton Ruff, MD  Cardiologist:  Candee Furbish, MD  Electrophysiologist:  None   Referring MD: Leighton Ruff, MD   No chief complaint on file. Syncope  History of Present Illness:    Erica Alvarez is a 73 y.o. female with prior stroke 2007 right frontal MCA PFO status post closure with prior history of DVT, previously seen in 08/16/2015 for evaluation of dyspnea on exertion which resulted in normal echocardiogram and stress test here for follow-up.  It is been close to 3 years.  She was vacuuming and fainted. No warning. Went black. Doin't remember. No diaphoresis. By dizzy, head was spinning. No vomiting. May have been down for a few seconds. Normal lunch before, on Saturday. No warning signed. Hit on left side. Rolled over, husband helped her get up but dizzy, layed down for a few minutes. Got to bench. Nothing broken. After several minutes felt ok.   Two days later, recumbent bike, pain. Dr. Wynelle Link saw her, ortho OK.  Went to Dr. Drema Dallas. Restricted from driving. Blood work Ca was elevated. She was worried about arrhythmia.   Felt OK, but a little dizzy. Attributed to pollen and sinus. When walking 15 minutes, feel winded. No further syncope spell.   Has had vertigo, can treat herself. Different.   Her sister had stent placed coronary disease father has atherosclerosis as well husband had PCI of 95% lesion.  From [place].  She was a Scientist, clinical (histocompatibility and immunogenetics) at [employer].  Total cholesterol 192 LDL 117 triglycerides 90 hemoglobin A1c 6.1 hemoglobin 11.6 creatinine 0.86 potassium 4.8 ALT 11 TSH 2.1  Past Medical History:  Diagnosis Date  . Acute sinus infection    03-14-2014 current taking Z-Pak-- no fever but non-productive cough  . Benign essential tremor    takes inderal  . Cancer (HCC)    skin cancer 2 times  . Diverticulitis   . Family history of adverse reaction to  anesthesia    "sister had episode hypotension"  . History of basal cell carcinoma excision    nose  . History of colon polyps   . History of CVA (cerebrovascular accident) without residual deficits    [id number]  embolic right frontal MCA -- found to have PFO and Atrial Septum aneursym /  s/p  closure of PFO  . History of DVT of lower extremity    2003/left leg  . History of gastroesophageal reflux (GERD)   . Hyperlipidemia   . OA (osteoarthritis)    knees and hands  . Patellar clunk syndrome of left knee   . S/P patent foramen ovale closure dx post cva w/ PFO with atrial septal aneursym   07-01-2005  via Intracardiac echo probe with deployment of CardioSEAL septal occluder  . Stroke [facility]) 2007   due to patent foramen ovale  . Tendon tear    left gluteal tendon     Past Surgical History:  Procedure Laterality Date  . ABDOMINAL HYSTERECTOMY  1982  . APPENDECTOMY  1960's  . EYE SURGERY  2018   cataract extraction with lens placement  /Bil  . KNEE ARTHROSCOPY Bilateral left 02-22-2012/  right 11-03-2008   bilateral 2001  . KNEE ARTHROSCOPY Left 03/22/2015   Procedure: LEFT KNEE ARTHROSCOPY AND;  Surgeon: Gaynelle Arabian, MD;  Location: [facility];  Service: Orthopedics;  Laterality: Left;  . LAPAROSCOPIC CHOLECYSTECTOMY  1999  . OPEN  SURGICAL REPAIR OF GLUTEAL TENDON Left 03/26/2017   Procedure: OPEN SURGICAL REPAIR OF LEFT GLUTEAL TENDON;  Surgeon: Gaynelle Arabian, MD;  Location: [facility];  Service: Orthopedics;  Laterality: Left;  . PATENT FORAMEN OVALE CLOSURE  07-01-2005  dr Einar Gip   via Intracardiac echocardiogram successful placement CardioSEAL septal occluder (PFO measured about 34mm)  . SYNOVECTOMY Left 03/22/2015   Procedure: SYNOVECTOMY;  Surgeon: Gaynelle Arabian, MD;  Location: [facility];  Service: Orthopedics;  Laterality: Left;  . TOENAIL TRIMMING    . TONSILLECTOMY  1960's  . TOTAL HIP ARTHROPLASTY Right 04/29/2012   Procedure: TOTAL HIP  ARTHROPLASTY ANTERIOR APPROACH;  Surgeon: [name], MD;  Location: [facility];  Service: Orthopedics;  Laterality: Right;  . TOTAL KNEE ARTHROPLASTY  12/10/2011   Procedure: TOTAL KNEE ARTHROPLASTY;  Surgeon: [name], MD;  Location: [facility];  Service: Orthopedics;  Laterality: Left;  . TOTAL KNEE ARTHROPLASTY Right 08-08-2008    Current Medications: Current Meds  Medication Sig  . aspirin 81 MG tablet Take 81 mg by mouth daily.  . cetirizine (ZYRTEC) 10 MG tablet Take 10 mg by mouth daily.  . diazepam (VALIUM) 5 MG tablet Take 5 mg by mouth daily as needed.  . fenofibrate micronized (LOFIBRA) 67 MG capsule Take by mouth daily.  . fluconazole (DIFLUCAN) 150 MG tablet Take 150 mg by mouth as needed.  . Multiple Vitamin (MULTIVITAMIN) tablet Take 1 tablet by mouth daily.  . Omega-3 Fatty Acids (EQL OMEGA 3 FISH OIL) 1400 MG CAPS Take 1,400 mg by mouth daily.  Marland Kitchen omeprazole (PRILOSEC) 40 MG capsule TAKE 1 CAPSULE TWICE DAILY, 30 MINUTES BEFORE MORNING AND EVENING MEALS.  . Probiotic Product (PHILLIPS COLON HEALTH PO) Take 1 capsule by mouth daily.  . propranolol (INDERAL) 10 MG tablet Take 10 mg by mouth 2 (two) times daily.    Current Facility-Administered Medications for the 07/14/18 encounter (Office Visit) with Jerline Pain, MD  Medication  . 0.9 %  sodium chloride infusion     Allergies:   Hydrocodone; Medrol [methylprednisolone]; Penicillins; Sulfa antibiotics; Sulfasalazine; and Tetracycline   Social History   Socioeconomic History  . Marital status: Married    Spouse name: Not on file  . Number of children: Not on file  . Years of education: Not on file  . Highest education level: Not on file  Occupational History  . Not on file  Social Needs  . Financial resource strain: Not on file  . Food insecurity:    Worry: Not on file    Inability: Not on file  . Transportation needs:    Medical: Not on file    Non-medical: Not on file  Tobacco Use  . Smoking status:  Former Smoker    Years: 10.00    Last attempt to quit: 03/04/1980    Years since quitting: 38.3  . Smokeless tobacco: Never Used  Substance and Sexual Activity  . Alcohol use: Yes    Comment: occasional   . Drug use: No  . Sexual activity: Not on file  Lifestyle  . Physical activity:    Days per week: Not on file    Minutes per session: Not on file  . Stress: Not on file  Relationships  . Social connections:    Talks on phone: Not on file    Gets together: Not on file    Attends religious service: Not on file    Active member of club or organization: Not on file  Attends meetings of clubs or organizations: Not on file    Relationship status: Not on file  Other Topics Concern  . Not on file  Social History Narrative  . Not on file     Family History: The patient's family history includes COPD in her sister; Cancer - Colon in her father; Colon cancer (age of onset: 84) in her father; Coronary artery disease in her sister; Dementia in her mother.  ROS:   Please see the history of present illness.    Denies any fevers chills nausea vomiting bleeding orthopnea PND all other systems reviewed and are negative.  EKGs/Labs/Other Studies Reviewed:    The following studies were reviewed today:  ECHO 09/07/15: - Left ventricle: The cavity size was normal. There was mild focal   basal hypertrophy of the septum. Systolic function was normal.   The estimated ejection fraction was in the range of 60% to 65%.   Wall motion was normal; there were no regional wall motion   abnormalities. Left ventricular diastolic function parameters   were normal. - Mitral valve: Mildly thickened leaflets . There was trivial   regurgitation. - Left atrium: The atrium was normal in size. - Atrial septum: Thickend atrial septum consistent with prior   closure. No ASD by color doppler. - Tricuspid valve: There was trivial regurgitation. - Pulmonary arteries: PA peak pressure: 21 mm Hg (S). - Inferior  vena cava: The vessel was normal in size. The   respirophasic diameter changes were in the normal range (= 50%),   consistent with normal central venous pressure.  Impressions:  - Essentially normal study.  Nuclear stress test 09/07/2015:  Nuclear stress EF: 58%.  There was no ST segment deviation noted during stress.  The study is normal.  This is a low risk study.  The left ventricular ejection fraction is normal (55-65%).   Normal resting and stress perfusion. No ischemia or infarction EF 58%   EKG:  EKG is  ordered today.  The ekg ordered today demonstrates normal sinus rhythm 61 with no other abnormalities, QTC is 396, PR interval is 170.  No delta waves.  Recent Labs: No results found for requested labs within last 8760 hours.  Recent Lipid Panel No results found for: CHOL, TRIG, HDL, CHOLHDL, VLDL, LDLCALC, LDLDIRECT  Physical Exam:    VS:  BP 130/86   Pulse 61   Ht 5' (1.524 m)   Wt 171 lb (77.6 kg)   LMP  (LMP Unknown)   BMI 33.40 kg/m     Wt Readings from Last 3 Encounters:  07/14/18 171 lb (77.6 kg)  10/22/17 172 lb (78 kg)  10/08/17 172 lb (78 kg)     GEN:  Well nourished, well developed in no acute distress HEENT: Normal NECK: No JVD; No carotid bruits LYMPHATICS: No lymphadenopathy CARDIAC: RRR, no murmurs, rubs, gallops RESPIRATORY:  Clear to auscultation without rales, wheezing or rhonchi  ABDOMEN: Soft, non-tender, non-distended MUSCULOSKELETAL:  No edema; No deformity  SKIN: Warm and dry NEUROLOGIC:  Alert and oriented x 3 PSYCHIATRIC:  Normal affect   ASSESSMENT:    1. Syncope and collapse    PLAN:    In order of problems listed above:  Unexplained syncope - Certainly could be arrhythmia, no warning, fell, blacked out.  She has not had any episodes before or since.  She did not seem to have any prodrome or typical vagal-like symptoms such as diaphoresis nausea.  This could have also been a transient decrease in  her blood pressure  as a result of her having some trouble getting back up for a few minutes after the event.  This could still point to an autonomic cause.  Continue to encourage good hydration. -I agree with 69-month driving restriction. - We will go ahead and check an echocardiogram to ensure proper structure and function of her heart.  Previously in 2017 this was normal. -I will also check an event monitor to make sure that she does not have any adverse arrhythmias. -I will check carotid Dopplers.  Perhaps turning of her head vacuuming, could she have a carotid lesion.  Her father had carotid artery stenosis. -Nonetheless, she is feeling better no recurrent symptoms.  We will continue to monitor for any changes.  She has minor dyspnea on exertion which was a similar symptom to what she had in 2017.  Prior stroke PFO closure - Quick return to normal symptoms with her recent syncope.  I do not think that this was a stroke.  Vertigo - She has had benign positional vertigo, she understands the maneuvers to help her with this.  She states that this was different from prior vertigo episodes.   Medication Adjustments/Labs and Tests Ordered: Current medicines are reviewed at length with the patient today.  Concerns regarding medicines are outlined above.  Orders Placed This Encounter  Procedures  . LONG TERM MONITOR (3-14 DAYS)  . EKG 12-Lead  . ECHOCARDIOGRAM COMPLETE   No orders of the defined types were placed in this encounter.   Patient Instructions  Medication Instructions:  The current medical regimen is effective;  continue present plan and medications.  If you need a refill on your cardiac medications before your next appointment, please call your pharmacy.   Testing/Procedures: Your physician has requested that you have an echocardiogram. Echocardiography is a painless test that uses sound waves to create images of your heart. It provides your doctor with information about the size and shape of your  heart and how well your heart's chambers and valves are working. This procedure takes approximately one hour. There are no restrictions for this procedure.  Your physician has requested that you have a carotid duplex. This test is an ultrasound of the carotid arteries in your neck. It looks at blood flow through these arteries that supply the brain with blood. Allow one hour for this exam. There are no restrictions or special instructions.  Your physician has recommended that you wear a holter monitor. Holter monitors are medical devices that record the heart's electrical activity. Doctors most often use these monitors to diagnose arrhythmias. Arrhythmias are problems with the speed or rhythm of the heartbeat. The monitor is a small, portable device. You can wear one while you do your normal daily activities. This is usually used to diagnose what is causing palpitations/syncope (passing out).  Follow-Up: Follow up as needed after the above testing.  Thank you for choosing [facility]!!        Signed, Candee Furbish, MD  07/14/2018 9:33 AM    [place]

## 2018-07-17 ENCOUNTER — Ambulatory Visit (INDEPENDENT_AMBULATORY_CARE_PROVIDER_SITE_OTHER): Payer: Medicare Other

## 2018-07-17 DIAGNOSIS — R7989 Other specified abnormal findings of blood chemistry: Secondary | ICD-10-CM | POA: Diagnosis not present

## 2018-07-17 DIAGNOSIS — R55 Syncope and collapse: Secondary | ICD-10-CM

## 2018-07-20 ENCOUNTER — Other Ambulatory Visit: Payer: Self-pay

## 2018-07-20 ENCOUNTER — Ambulatory Visit (HOSPITAL_COMMUNITY)
Admission: RE | Admit: 2018-07-20 | Discharge: 2018-07-20 | Disposition: A | Discharge status: Home / Self Care | Payer: Medicare Other | Source: Ambulatory Visit | Attending: Cardiology | Admitting: Cardiology

## 2018-07-20 ENCOUNTER — Telehealth (HOSPITAL_COMMUNITY): Payer: Self-pay

## 2018-07-20 DIAGNOSIS — R55 Syncope and collapse: Secondary | ICD-10-CM | POA: Diagnosis not present

## 2018-07-20 NOTE — Telephone Encounter (Signed)

## 2018-07-21 ENCOUNTER — Ambulatory Visit (HOSPITAL_COMMUNITY): Payer: Medicare Other | Attending: Cardiology

## 2018-07-21 ENCOUNTER — Other Ambulatory Visit: Payer: Medicare Other | Admitting: Cardiology

## 2018-07-21 DIAGNOSIS — R55 Syncope and collapse: Secondary | ICD-10-CM | POA: Insufficient documentation

## 2018-08-09 DIAGNOSIS — R55 Syncope and collapse: Secondary | ICD-10-CM | POA: Diagnosis not present

## 2018-08-10 ENCOUNTER — Other Ambulatory Visit: Payer: Self-pay

## 2018-08-13 ENCOUNTER — Telehealth: Payer: Self-pay | Admitting: Cardiology

## 2018-08-13 NOTE — Telephone Encounter (Signed)
New message:    Patient calling concerning some monitor results. Patient stating she had to return the monitor. Please call patient.

## 2018-08-13 NOTE — Telephone Encounter (Signed)
Spoke to the pt and informed her that her monitor results are in Dr Marlou Porch in-basket of studies to read.  Informed the pt that once he has a chance to get in his work que, he will review her study, place his interpretation on it, and send this to either his covering RN or triage to call her back thereafter. Pt verbalized understanding and was gracious for the feedback.

## 2018-08-14 ENCOUNTER — Telehealth: Payer: Self-pay | Admitting: Cardiology

## 2018-08-14 NOTE — Telephone Encounter (Signed)
Pt aware of monitor results. She had questions regarding her syncope. I stated syncope could be from a number of issues and advised her to follow up with her PCP regarding this.   She would like a call back regarding follow up with Dr Marlou Porch as well.

## 2018-08-14 NOTE — Telephone Encounter (Signed)
Pt understands the state [place]  does not allow driving for 6 months after a syncopal episode.   She verbalized understanding and had no additional questions.

## 2018-08-14 NOTE — Telephone Encounter (Signed)
-----   Message from Jerline Pain, MD sent at 08/13/2018  5:51 PM EDT -----  Sinus rhythm, no adverse arrhythmias identified  No atrial fibrillation, no pauses  Occasional PVCs, occasional trigeminy pattern  Occasional paroxysmal atrial tachycardia, 4-6 beats duration. 1 episode resulted in aberrantly conducted wide QRS. Not ventricular tachycardia.  Symptoms correlated with normal sinus rhythm.   Overall reassuring monitor with occasional PVCs and paroxysmal atrial tachycardia, benign. Candee Furbish, MD

## 2018-08-14 NOTE — Telephone Encounter (Signed)
Patient called wanting to know if Dr. Marlou Porch still does not want patient to drive.

## 2018-08-20 ENCOUNTER — Telehealth: Payer: Self-pay | Admitting: Cardiology

## 2018-08-20 DIAGNOSIS — R55 Syncope and collapse: Secondary | ICD-10-CM

## 2018-08-20 NOTE — Telephone Encounter (Signed)
Responded to pt's MyChart request for a referral to be seen by Dr Leonie Man.  Advised an order has been placed and she will receive a call from that office to schedule.

## 2018-08-20 NOTE — Telephone Encounter (Signed)
Please refer to Dr. Leonie Man given her unexplained syncope with prior history of stroke.  Cardiac work-up has been unremarkable. Candee Furbish, MD

## 2018-08-20 NOTE — Telephone Encounter (Signed)
Will forward to Dr Marlou Porch for review and any orders.

## 2018-08-20 NOTE — Telephone Encounter (Signed)
New Message:     Pt says she needs Dr Gillian Shields to send a referral to  Dr Antony Contras for her to see him  please.

## 2018-08-20 NOTE — Telephone Encounter (Signed)
Order placed for referral to Dr Leonie Man for unexplained syncope with HX of stroke.

## 2018-09-02 ENCOUNTER — Encounter: Payer: Self-pay | Admitting: Neurology

## 2018-09-02 ENCOUNTER — Ambulatory Visit (INDEPENDENT_AMBULATORY_CARE_PROVIDER_SITE_OTHER): Payer: Medicare Other | Admitting: Neurology

## 2018-09-02 ENCOUNTER — Other Ambulatory Visit: Payer: Self-pay

## 2018-09-02 ENCOUNTER — Telehealth: Payer: Self-pay | Admitting: Neurology

## 2018-09-02 VITALS — BP 148/94 | HR 67 | Temp 97.5°F | Ht 60.0 in | Wt 171.8 lb

## 2018-09-02 DIAGNOSIS — W19XXXA Unspecified fall, initial encounter: Secondary | ICD-10-CM

## 2018-09-02 DIAGNOSIS — G459 Transient cerebral ischemic attack, unspecified: Secondary | ICD-10-CM | POA: Diagnosis not present

## 2018-09-02 DIAGNOSIS — D72829 Elevated white blood cell count, unspecified: Secondary | ICD-10-CM | POA: Diagnosis not present

## 2018-09-02 MED ORDER — ALPRAZOLAM 0.25 MG PO TABS: 0.2500 mg | ORAL_TABLET | ORAL | 0 refills | Status: DC

## 2018-09-02 NOTE — Telephone Encounter (Signed)
Medicare/[place]ual of [place] order sent to GI. No auth they will reach out to the patient to schedule.  

## 2018-09-02 NOTE — Patient Instructions (Signed)
I had a long discussion with the patient with regards to her episode of sudden fall with questionable loss of consciousness and discussed differential diagnosis including syncope versus vertebrobasilar TIA.  Seizure seems unlikely.  I recommend further evaluation by checking MRI scan of the brain, MRA of the brain and neck, EEG, lipid profile and hemoglobin A1c.  I recommend patient not drive till the work-up is completed.  I have also advised her to get up slowly and avoid sudden movements.  She will return for follow-up in 2 months or call earlier if necessary.

## 2018-09-02 NOTE — Progress Notes (Signed)
[facility] [address] [place]. [place] [address] [phone]       OFFICE CONSULT NOTE  Ms. KEYSHAWNA PROUSE Date of Birth:  [birth year] Medical Record Number:  [id number]   Referring MD: Candee Furbish Reason for Referral: Fall  HPI: Mr. Vanderveer is a 73 year old pleasant Caucasian lady seen today for initial office consultation visit for fall.  History is obtained from the patient, review of electronic medical records and I personally reviewed imaging films in PACS.  She has a past medical history of hyperlipidemia, osteoarthritis, gastroesophageal reflux disease, cryptogenic right frontal MCA infarct in January 2007 status post elective endovascular PFO closure by Dr. Einar Gip.  Remote history of left leg DVT in 2003, benign essential tremor who had an episode of sudden onset of fall on 06/27/2018.  She states she was at home vacuuming her bedroom floor when all of a sudden without any warning she fell face forward on the ground.  She felt dizzy and off-balance when she tried to stand and hence in the ground for a few minutes.  Her husband came and had to help her up.  After around 10 minutes she was able to stand up but still felt dizzy and off balance and lightheaded.  She denied vertigo, nausea, vomiting loss of vision double vision or any speech difficulties.  She saw her primary physician who referred her to Dr. Candee Furbish cardiologist for evaluation for syncope.  2D echo was performed which was unremarkable and patient had 30-day Holter heart monitor which showed no significant cardiac arrhythmias.  Patient had a remote history of stroke in 2007 when she was found to have a PFO with atrial septal aneurysm which was in endovascularly closed by Dr. Einar Gip.  She is has been on aspirin since then and has done well without recurrent stroke or TIA symptoms.  She denies any history of palpitations, atrial fibrillation or previous syncopal events.  She denies any history of seizures loss  of consciousness significant head injury.  She has mild benign essential tremor which seems controlled on Inderal.  She is done well since this episode in the last 2 months has had no new issues.  She has not been driving.  ROS:   14 system review of systems is positive for fall, brief loss of consciousness, dizziness, imbalance and all other systems negative PMH:  Past Medical History:  Diagnosis Date  . Acute sinus infection    03-14-2014 current taking Z-Pak-- no fever but non-productive cough  . Benign essential tremor    takes inderal  . Cancer (HCC)    skin cancer 2 times  . Diverticulitis   . Family history of adverse reaction to anesthesia    "sister had episode hypotension"  . History of basal cell carcinoma excision    nose  . History of colon polyps   . History of CVA (cerebrovascular accident) without residual deficits    [id number]  embolic right frontal MCA -- found to have PFO and Atrial Septum aneursym /  s/p  closure of PFO  . History of DVT of lower extremity    2003/left leg  . History of gastroesophageal reflux (GERD)   . Hyperlipidemia   . OA (osteoarthritis)    knees and hands  . Patellar clunk syndrome of left knee   . S/P patent foramen ovale closure dx post cva w/ PFO with atrial septal aneursym   07-01-2005  via Intracardiac echo probe with deployment of CardioSEAL septal occluder  . Stroke (  [place]) 2007   due to patent foramen ovale  . Tendon tear    left gluteal tendon     Social History:  Social History   Socioeconomic History  . Marital status: Married    Spouse name: Not on file  . Number of children: Not on file  . Years of education: Not on file  . Highest education level: Not on file  Occupational History  . Not on file  Social Needs  . Financial resource strain: Not on file  . Food insecurity    Worry: Not on file    Inability: Not on file  . Transportation needs    Medical: Not on file    Non-medical: Not on file  Tobacco Use  .  Smoking status: Former Smoker    Years: 10.00    Quit date: 03/04/1980    Years since quitting: 38.5  . Smokeless tobacco: Never Used  Substance and Sexual Activity  . Alcohol use: Yes    Comment: occasional   . Drug use: No  . Sexual activity: Not on file  Lifestyle  . Physical activity    Days per week: Not on file    Minutes per session: Not on file  . Stress: Not on file  Relationships  . Social Herbalist on phone: Not on file    Gets together: Not on file    Attends religious service: Not on file    Active member of club or organization: Not on file    Attends meetings of clubs or organizations: Not on file    Relationship status: Not on file  . Intimate partner violence    Fear of current or ex partner: Not on file    Emotionally abused: Not on file    Physically abused: Not on file    Forced sexual activity: Not on file  Other Topics Concern  . Not on file  Social History Narrative  . Not on file    Medications:   Current Outpatient Medications on File Prior to Visit  Medication Sig Dispense Refill  . aspirin 81 MG tablet Take 81 mg by mouth daily.    . cetirizine (ZYRTEC) 10 MG tablet Take 10 mg by mouth daily.    . diazepam (VALIUM) 5 MG tablet Take 5 mg by mouth daily as needed.    . fenofibrate micronized (LOFIBRA) 67 MG capsule Take by mouth daily.    . fluconazole (DIFLUCAN) 150 MG tablet Take 150 mg by mouth as needed.    . Multiple Vitamin (MULTIVITAMIN) tablet Take 1 tablet by mouth daily.    . Omega-3 Fatty Acids (EQL OMEGA 3 FISH OIL) 1400 MG CAPS Take 1,400 mg by mouth daily.    Marland Kitchen omeprazole (PRILOSEC) 40 MG capsule TAKE 1 CAPSULE TWICE DAILY, 30 MINUTES BEFORE MORNING AND EVENING MEALS.    . Probiotic Product (PHILLIPS COLON HEALTH PO) Take 1 capsule by mouth daily.    . propranolol (INDERAL) 10 MG tablet Take 10 mg by mouth 2 (two) times daily.      Current Facility-Administered Medications on File Prior to Visit  Medication Dose Route  Frequency Provider Last Rate Last Dose  . 0.9 %  sodium chloride infusion  500 mL Intravenous Once Ladene Artist, MD        Allergies:   Allergies  Allergen Reactions  . Hydrocodone Itching  . Medrol [Methylprednisolone] Other (See Comments)    Disorientation/ hot flashes  . Penicillins Itching, Nausea  And Vomiting and Other (See Comments)    Has patient had a PCN reaction causing immediate rash, facial/tongue/throat swelling, SOB or lightheadedness with hypotension: No Has patient had a PCN reaction causing severe rash involving mucus membranes or skin necrosis: No Has patient had a PCN reaction that required hospitalization: No Has patient had a PCN reaction occurring within the last 10 years: No If all of the above answers are "NO", then may proceed with Cephalosporin use.   . Sulfa Antibiotics Nausea And Vomiting    itching  . Sulfasalazine Nausea And Vomiting    itching  . Tetracycline Itching and Nausea And Vomiting    Physical Exam General: well developed, well nourished elderly Caucasian lady, seated, in no evident distress Head: head normocephalic and atraumatic.   Neck: supple with no carotid or supraclavicular bruits Cardiovascular: regular rate and rhythm, no murmurs Musculoskeletal: no deformity Skin:  no rash/petichiae Vascular:  Normal pulses all extremities  Neurologic Exam Mental Status: Awake and fully alert. Oriented to place and time. Recent and remote memory intact. Attention span, concentration and fund of knowledge appropriate. Mood and affect appropriate.  Cranial Nerves: Fundoscopic exam reveals sharp disc margins. Pupils equal, briskly reactive to light. Extraocular movements full without nystagmus. Visual fields full to confrontation. Hearing mildly diminished on the right. Facial sensation intact. Face, tongue, palate moves normally and symmetrically.  Motor: Normal bulk and tone. Normal strength in all tested extremity muscles. Sensory.: intact to  touch , pinprick , position and vibratory sensation.  Coordination: Rapid alternating movements normal in all extremities. Finger-to-nose and heel-to-shin performed accurately bilaterally. Gait and Station: Arises from chair without difficulty. Stance is normal. Gait demonstrates normal stride length and balance . Able to heel, toe and tandem walk with mild difficulty.  Reflexes: 1+ and symmetric. Toes downgoing.   NIHSS  0Modified Rankin  0   ASSESSMENT: 73 year old Caucasian lady with sudden episode of fall with questionable brief loss of consciousness of unclear etiology.  Possibilities include vertebrobasilar TIA given prior history of stroke.  Seizure would be less likely.  Recent work-up for cardiac etiology of syncope has been unrewarding.  She has remote history of right MCA branch infarct in 2007 and has undergone endovascular PFO closure.     PLAN: I had a long discussion with the patient with regards to her episode of sudden fall with questionable loss of consciousness and discussed differential diagnosis including syncope versus vertebrobasilar TIA.  Seizure seems unlikely.  I recommend further evaluation by checking MRI scan of the brain, MRA of the brain and neck, EEG, lipid profile and hemoglobin A1c.  I recommend patient not drive till the work-up is completed.  I have also advised her to get up slowly and avoid sudden movements.  Greater than 50% time during this 45-minute consultation visit was spent on counseling and coordination of care about her episode of fall, prior stroke and answering questions she will return for follow-up in 2 months or call earlier if necessary. Antony Contras, MD  [facility] [address] [place], [place] [address]  Phone [phone] Fax [phone] Note: This document was prepared with digital dictation and possible smart phrase technology. Any transcriptional errors that result from this process are unintentional.

## 2018-09-08 NOTE — Telephone Encounter (Signed)
Fax confirmation received [facility] xanax [phone].

## 2018-09-09 ENCOUNTER — Ambulatory Visit (INDEPENDENT_AMBULATORY_CARE_PROVIDER_SITE_OTHER): Payer: Medicare Other

## 2018-09-09 ENCOUNTER — Telehealth: Payer: Self-pay

## 2018-09-09 ENCOUNTER — Other Ambulatory Visit: Payer: Self-pay

## 2018-09-09 DIAGNOSIS — W19XXXA Unspecified fall, initial encounter: Secondary | ICD-10-CM

## 2018-09-09 DIAGNOSIS — R55 Syncope and collapse: Secondary | ICD-10-CM | POA: Diagnosis not present

## 2018-09-09 DIAGNOSIS — G459 Transient cerebral ischemic attack, unspecified: Secondary | ICD-10-CM

## 2018-09-09 DIAGNOSIS — R7309 Other abnormal glucose: Secondary | ICD-10-CM | POA: Diagnosis not present

## 2018-09-09 NOTE — Telephone Encounter (Signed)
Xanax rx fax to pharmacy by Bedelia Person on 09/08/2018 for MRI testing.

## 2018-09-10 LAB — LIPID PANEL
Chol/HDL Ratio: 4.3 ratio (ref 0.0–4.4)
Cholesterol, Total: 190 mg/dL (ref 100–199)
HDL: 44 mg/dL (ref >39)
LDL Calculated: 111 mg/dL — ABNORMAL HIGH (ref 0–99)
Triglycerides: 177 mg/dL — ABNORMAL HIGH (ref 0–149)
VLDL Cholesterol Cal: 35 mg/dL (ref 5–40)

## 2018-09-10 LAB — HEMOGLOBIN A1C
Est. average glucose Bld gHb Est-mCnc: 128 mg/dL
Hgb A1c MFr Bld: 6.1 % — ABNORMAL HIGH (ref 4.8–5.6)

## 2018-09-17 ENCOUNTER — Other Ambulatory Visit: Payer: Self-pay

## 2018-09-17 ENCOUNTER — Ambulatory Visit (HOSPITAL_COMMUNITY)
Admission: RE | Admit: 2018-09-17 | Discharge: 2018-09-17 | Disposition: A | Discharge status: Home / Self Care | Payer: Medicare Other | Source: Ambulatory Visit | Attending: Neurology | Admitting: Neurology

## 2018-09-17 DIAGNOSIS — G459 Transient cerebral ischemic attack, unspecified: Secondary | ICD-10-CM | POA: Diagnosis not present

## 2018-09-17 NOTE — Progress Notes (Signed)
TCD bubble study completed with Dr. Erlinda Hong.  Refer to "CV Proc" under chart review to view preliminary results.  09/17/2018 2:09 PM Maudry Mayhew, MHA, RVT, RDCS, RDMS

## 2018-09-22 ENCOUNTER — Telehealth: Payer: Self-pay

## 2018-09-22 NOTE — Telephone Encounter (Signed)
I called patient about blood work. I gave her the results listed by Dr.SEthi. Pt stated she is on medication fenofibrate micronized (LOFIBRA) for her triglycerides. I stated Dr.SEthi wants her to contact her PCP if they want to start her on statin for her LDL. I stated diabetes is borderline. I stated copy will be sent to her PCP and to contact them for their recommendations. PT verbalized understanding.  ------

## 2018-09-22 NOTE — Telephone Encounter (Signed)
-----   Message from Garvin Fila, MD sent at 09/18/2018  2:40 PM EDT ----- Mitchell Heir inform the patient that blood work screening for diabetes suggest that she is at borderline risk.  Cholesterol profile shows that the bad cholesterol and triglycerides are both elevated.  She likely needs to be on a statin but if she has trouble tolerating it she may need alternative medicines.  Kindly advise her to see primary care physician or cardiologist for the same.

## 2018-09-23 DIAGNOSIS — M8580 Other specified disorders of bone density and structure, unspecified site: Secondary | ICD-10-CM | POA: Diagnosis not present

## 2018-09-23 NOTE — Telephone Encounter (Signed)
Labs fax to Dr. Leighton Ruff for review of triglycerides. Fax receive and confirmed.

## 2018-10-01 ENCOUNTER — Ambulatory Visit
Admission: RE | Admit: 2018-10-01 | Discharge: 2018-10-01 | Disposition: A | Discharge status: Home / Self Care | Payer: Medicare Other | Source: Ambulatory Visit | Attending: Neurology | Admitting: Neurology

## 2018-10-01 ENCOUNTER — Other Ambulatory Visit: Payer: Self-pay

## 2018-10-01 DIAGNOSIS — G459 Transient cerebral ischemic attack, unspecified: Secondary | ICD-10-CM

## 2018-10-01 MED ORDER — GADOBENATE DIMEGLUMINE 529 MG/ML IV SOLN
15.0000 mL | Freq: Once | INTRAVENOUS | Status: AC | PRN
  Administered 2018-10-01: 15 mL via INTRAVENOUS

## 2018-10-06 DIAGNOSIS — M5136 Other intervertebral disc degeneration, lumbar region: Secondary | ICD-10-CM | POA: Diagnosis not present

## 2018-10-09 ENCOUNTER — Telehealth: Payer: Self-pay | Admitting: Neurology

## 2018-10-09 NOTE — Telephone Encounter (Signed)
Pt states she has results to her MRI, she would like a call to discuss the results

## 2018-10-12 NOTE — Telephone Encounter (Signed)
I called the patient and gave her the results of the MRA of the brain and neck both been unremarkable.  MRI scan of the brain shows an old right MCA infarct which she had in 2007.  There is a small incidental venous angioma.  There is no new or worrisome findings.  EEG done previously on 09/21/2018 was normal.  The patient was cleared to drive with the limit driving to daytime hours and to avoid long distances.  She voiced understanding

## 2018-11-11 ENCOUNTER — Ambulatory Visit: Payer: Medicare Other | Admitting: Neurology

## 2018-11-13 DIAGNOSIS — Z87891 Personal history of nicotine dependence: Secondary | ICD-10-CM | POA: Diagnosis not present

## 2018-11-13 DIAGNOSIS — H8111 Benign paroxysmal vertigo, right ear: Secondary | ICD-10-CM | POA: Diagnosis not present

## 2018-11-13 DIAGNOSIS — Z7289 Other problems related to lifestyle: Secondary | ICD-10-CM | POA: Diagnosis not present

## 2018-11-13 DIAGNOSIS — R42 Dizziness and giddiness: Secondary | ICD-10-CM | POA: Diagnosis not present

## 2018-11-13 DIAGNOSIS — H6123 Impacted cerumen, bilateral: Secondary | ICD-10-CM | POA: Diagnosis not present

## 2018-11-13 DIAGNOSIS — Z9089 Acquired absence of other organs: Secondary | ICD-10-CM | POA: Diagnosis not present

## 2018-11-16 DIAGNOSIS — H9192 Unspecified hearing loss, left ear: Secondary | ICD-10-CM | POA: Diagnosis not present

## 2018-11-16 DIAGNOSIS — H8111 Benign paroxysmal vertigo, right ear: Secondary | ICD-10-CM | POA: Diagnosis not present

## 2018-11-21 DIAGNOSIS — Z23 Encounter for immunization: Secondary | ICD-10-CM | POA: Diagnosis not present

## 2018-11-23 ENCOUNTER — Ambulatory Visit (INDEPENDENT_AMBULATORY_CARE_PROVIDER_SITE_OTHER): Payer: Medicare Other | Admitting: Neurology

## 2018-11-23 ENCOUNTER — Other Ambulatory Visit: Payer: Self-pay

## 2018-11-23 VITALS — BP 130/86 | HR 77 | Temp 97.7°F | Wt 172.0 lb

## 2018-11-23 DIAGNOSIS — H8111 Benign paroxysmal vertigo, right ear: Secondary | ICD-10-CM

## 2018-11-23 NOTE — Patient Instructions (Signed)
I had a long discussion with the patient with regards to her episode of brief loss of consciousness which remains of unclear etiology and she has had negative neurovascular work-up, EEG as well as cardiac work-up.  I recommend she continue aspirin for stroke prevention and maintain aggressive risk factor modification given her prior history of stroke.  Keeps blood pressure goal below 130/90, LDL goal below 70 mg percent and diabetes with hemoglobin A1c goal below 6.5%.  I recommend she start taking Crestor 2.5 mg daily along with co-Q10 200 mg daily to achieve her LDL target.  She does have a remote history of statin myalgias in the past but agrees to discuss starting statin with her upcoming visit with her primary care physician.  She will also keep her appointment for ongoing vestibular stabilization exercises for her benign paroxysmal positional vertigo.  No scheduled follow-up appointment with me is necessary but she may return in the future if needed   Benign Positional Vertigo Vertigo is the feeling that you or your surroundings are moving when they are not. Benign positional vertigo is the most common form of vertigo. This is usually a harmless condition (benign). This condition is positional. This means that symptoms are triggered by certain movements and positions. This condition can be dangerous if it occurs while you are doing something that could cause harm to you or others. This includes activities such as driving or operating machinery. What are the causes? In many cases, the cause of this condition is not known. It may be caused by a disturbance in an area of the inner ear that helps your brain to sense movement and balance. This disturbance can be caused by:  Viral infection (labyrinthitis).  Head injury.  Repetitive motion, such as jumping, dancing, or running. What increases the risk? You are more likely to develop this condition if:  You are a woman.  You are 74 years of age or  older. What are the signs or symptoms? Symptoms of this condition usually happen when you move your head or your eyes in different directions. Symptoms may start suddenly, and usually last for less than a minute. They include:  Loss of balance and falling.  Feeling like you are spinning or moving.  Feeling like your surroundings are spinning or moving.  Nausea and vomiting.  Blurred vision.  Dizziness.  Involuntary eye movement (nystagmus). Symptoms can be mild and cause only minor problems, or they can be severe and interfere with daily life. Episodes of benign positional vertigo may return (recur) over time. Symptoms may improve over time. How is this diagnosed? This condition may be diagnosed based on:  Your medical history.  Physical exam of the head, neck, and ears.  Tests, such as: ? MRI. ? CT scan. ? Eye movement tests. Your health care provider may ask you to change positions quickly while he or she watches you for symptoms of benign positional vertigo, such as nystagmus. Eye movement may be tested with a variety of exams that are designed to evaluate or stimulate vertigo. ? An electroencephalogram (EEG). This records electrical activity in your brain. ? Hearing tests. You may be referred to a health care provider who specializes in ear, nose, and throat (ENT) problems (otolaryngologist) or a provider who specializes in disorders of the nervous system (neurologist). How is this treated?  This condition may be treated in a session in which your health care provider moves your head in specific positions to adjust your inner ear back to normal.  Treatment for this condition may take several sessions. Surgery may be needed in severe cases, but this is rare. In some cases, benign positional vertigo may resolve on its own in 2-4 weeks. Follow these instructions at home: Safety  Move slowly. Avoid sudden body or head movements or certain positions, as told by your health care  provider.  Avoid driving until your health care provider says it is safe for you to do so.  Avoid operating heavy machinery until your health care provider says it is safe for you to do so.  Avoid doing any tasks that would be dangerous to you or others if vertigo occurs.  If you have trouble walking or keeping your balance, try using a cane for stability. If you feel dizzy or unstable, sit down right away.  Return to your normal activities as told by your health care provider. Ask your health care provider what activities are safe for you. General instructions  Take over-the-counter and prescription medicines only as told by your health care provider.  Drink enough fluid to keep your urine pale yellow.  Keep all follow-up visits as told by your health care provider. This is important. Contact a health care provider if:  You have a fever.  Your condition gets worse or you develop new symptoms.  Your family or friends notice any behavioral changes.  You have nausea or vomiting that gets worse.  You have numbness or a "pins and needles" sensation. Get help right away if you:  Have difficulty speaking or moving.  Are always dizzy.  Faint.  Develop severe headaches.  Have weakness in your legs or arms.  Have changes in your hearing or vision.  Develop a stiff neck.  Develop sensitivity to light. Summary  Vertigo is the feeling that you or your surroundings are moving when they are not. Benign positional vertigo is the most common form of vertigo.  The cause of this condition is not known. It may be caused by a disturbance in an area of the inner ear that helps your brain to sense movement and balance.  Symptoms include loss of balance and falling, feeling that you or your surroundings are moving, nausea and vomiting, and blurred vision.  This condition can be diagnosed based on symptoms, physical exam, and other tests, such as MRI, CT scan, eye movement tests, and  hearing tests.  Follow safety instructions as told by your health care provider. You will also be told when to contact your health care provider in case of problems. This information is not intended to replace advice given to you by your health care provider. Make sure you discuss any questions you have with your health care provider. Document Released: 11/26/2005 Document Revised: 07/30/2017 Document Reviewed: 07/30/2017 Elsevier Patient Education  2020 [employer].

## 2018-11-23 NOTE — Progress Notes (Signed)
[facility] [address] [place]. [place] [address] [phone]       OFFICE FOLLOW UP VISIT NOTE  [name] Date of Birth:  1946-02-06 Medical Record Number:  [id number]   Referring MD: Erica Alvarez Reason for Referral: Fall  HPI: Initial visit 09/02/2018 Erica Alvarez is a 73 year old pleasant Caucasian lady seen today for initial office consultation visit for fall.  History is obtained from the patient, review of electronic medical records and I personally reviewed imaging films in PACS.  She has a past medical history of hyperlipidemia, osteoarthritis, gastroesophageal reflux disease, cryptogenic right frontal MCA infarct in January 2007 status post elective endovascular PFO closure by Dr. Einar Alvarez.  Remote history of left leg DVT in 2003, benign essential tremor who had an episode of sudden onset of fall on 06/27/2018.  She states she was at home vacuuming her bedroom floor when all of a sudden without any warning she fell face forward on the ground.  She felt dizzy and off-balance when she tried to stand and hence in the ground for a few minutes.  Her husband came and had to help her up.  After around 10 minutes she was able to stand up but still felt dizzy and off balance and lightheaded.  She denied vertigo, nausea, vomiting loss of vision double vision or any speech difficulties.  She saw her primary physician who referred her to Dr. Candee Alvarez cardiologist for evaluation for syncope.  2D echo was performed which was unremarkable and patient had 30-day Holter heart monitor which showed no significant cardiac arrhythmias.  Patient had a remote history of stroke in 2007 when she was found to have a PFO with atrial septal aneurysm which was in endovascularly closed by Dr. Einar Alvarez.  She is has been on aspirin since then and has done well without recurrent stroke or TIA symptoms.  She denies any history of palpitations, atrial fibrillation or previous syncopal events.  She  denies any history of seizures loss of consciousness significant head injury.  She has mild benign essential tremor which seems controlled on Inderal.  She is done well since this episode in the last 2 months has had no new issues.  She has not been driving. Update 11/23/2018: She returns for follow-up after last visit 3 months ago.  She states she has had no further episodes of loss of consciousness, seizures, stroke or TIA.  She recently went to an ENT physician's office to get the ears cleaned and had some transient positional vertigo symptoms and was diagnosed with benign paroxysmal positional vertigo.  She had episode of this in the remote past but has not had any recent exacerbations.  She has been referred for vestibular stabilization exercises which she has not yet started.  She did undergo lab work on 09/09/2018 and LDL cholesterol was slightly elevated at 1 1 1  mg percent.  Hemoglobin A1c was 6.1.  EEG done on 09/15/2018 was normal.  MRI scan of the brain on 10/01/2018 showed remote age right frontal MCA branch infarct no acute abnormality.  MRI of the brain and neck were both unremarkable without significant large vessel stenosis.  Patient states she is doing well she has been driving.  She has had no issues.  Blood pressure is well controlled today it is 130/86.  She is tolerating aspirin well without any bruising, bleeding or upset stomach.  She has no complaints.  She does have a remote history of statin myalgias when she had first taken  statins for a while after a stroke and she stopped them and has been taking fenofibrate instead.  She does have an upcoming visit with her primary care physician and is willing to discuss starting low-dose Crestor along with co-Q10. ROS:   14 system review of systems is positive for fall, brief loss of consciousness, dizziness, imbalance and all other systems negative PMH:  Past Medical History:  Diagnosis Date   Acute sinus infection    03-14-2014 current taking  Z-Pak-- no fever but non-productive cough   Benign essential tremor    takes inderal   Cancer (HCC)    skin cancer 2 times   Diverticulitis    Family history of adverse reaction to anesthesia    "sister had episode hypotension"   History of basal cell carcinoma excision    nose   History of colon polyps    History of CVA (cerebrovascular accident) without residual deficits    [id number]  embolic right frontal MCA -- found to have PFO and Atrial Septum aneursym /  s/p  closure of PFO   History of DVT of lower extremity    2003/left leg   History of gastroesophageal reflux (GERD)    Hyperlipidemia    OA (osteoarthritis)    knees and hands   Patellar clunk syndrome of left knee    S/P patent foramen ovale closure dx post cva w/ PFO with atrial septal aneursym   07-01-2005  via Intracardiac echo probe with deployment of CardioSEAL septal occluder   Stroke [facility]) 2007   due to patent foramen ovale   Tendon tear    left gluteal tendon     Social History:  Social History   Socioeconomic History   Marital status: Married    Spouse name: Not on file   Number of children: Not on file   Years of education: Not on file   Highest education level: Not on file  Occupational History   Not on file  Social Needs   Financial resource strain: Not on file   Food insecurity    Worry: Not on file    Inability: Not on file   Transportation needs    Medical: Not on file    Non-medical: Not on file  Tobacco Use   Smoking status: Former Smoker    Years: 10.00    Quit date: 03/04/1980    Years since quitting: 38.7   Smokeless tobacco: Never Used  Substance and Sexual Activity   Alcohol use: Yes    Comment: occasional    Drug use: No   Sexual activity: Not on file  Lifestyle   Physical activity    Days per week: Not on file    Minutes per session: Not on file   Stress: Not on file  Relationships   Social connections    Talks on phone: Not on file     Gets together: Not on file    Attends religious service: Not on file    Active member of club or organization: Not on file    Attends meetings of clubs or organizations: Not on file    Relationship status: Not on file   Intimate partner violence    Fear of current or ex partner: Not on file    Emotionally abused: Not on file    Physically abused: Not on file    Forced sexual activity: Not on file  Other Topics Concern   Not on file  Social History Narrative   Not on  file    Medications:   Current Outpatient Medications on File Prior to Visit  Medication Sig Dispense Refill   aspirin 81 MG tablet Take 81 mg by mouth daily.     cetirizine (ZYRTEC) 10 MG tablet Take 10 mg by mouth daily.     clobetasol ointment (TEMOVATE) 0.05 % clobetasol 0.05 % topical ointment     diazepam (VALIUM) 5 MG tablet Take 5 mg by mouth daily as needed.     fenofibrate micronized (LOFIBRA) 67 MG capsule Take by mouth daily.     fluconazole (DIFLUCAN) 150 MG tablet Take 150 mg by mouth as needed.     Multiple Vitamin (MULTIVITAMIN) tablet Take 1 tablet by mouth daily.     Omega-3 Fatty Acids (EQL OMEGA 3 FISH OIL) 1400 MG CAPS Take 1,400 mg by mouth daily.     omeprazole (PRILOSEC) 40 MG capsule TAKE 1 CAPSULE TWICE DAILY, 30 MINUTES BEFORE MORNING AND EVENING MEALS.     Probiotic Product (PHILLIPS COLON HEALTH PO) Take 1 capsule by mouth daily.     propranolol (INDERAL) 10 MG tablet Take 10 mg by mouth 2 (two) times daily.      No current facility-administered medications on file prior to visit.     Allergies:   Allergies  Allergen Reactions   Hydrocodone Itching   Medrol [Methylprednisolone] Other (See Comments)    Disorientation/ hot flashes   Penicillins Itching, Nausea And Vomiting and Other (See Comments)    Has patient had a PCN reaction causing immediate rash, facial/tongue/throat swelling, SOB or lightheadedness with hypotension: No Has patient had a PCN reaction causing  severe rash involving mucus membranes or skin necrosis: No Has patient had a PCN reaction that required hospitalization: No Has patient had a PCN reaction occurring within the last 10 years: No If all of the above answers are "NO", then may proceed with Cephalosporin use.    Sulfa Antibiotics Nausea And Vomiting    itching   Sulfasalazine Nausea And Vomiting    itching   Tetracycline Itching and Nausea And Vomiting    Physical Exam General: well developed, well nourished elderly Caucasian lady, seated, in no evident distress Head: head normocephalic and atraumatic.   Neck: supple with no carotid or supraclavicular bruits Cardiovascular: regular rate and rhythm, no murmurs Musculoskeletal: no deformity Skin:  no rash/petichiae Vascular:  Normal pulses all extremities  Neurologic Exam Mental Status: Awake and fully alert. Oriented to place and time. Recent and remote memory intact. Attention span, concentration and fund of knowledge appropriate. Mood and affect appropriate.  Cranial Nerves: Fundoscopic exam not done pupils equal, briskly reactive to light. Extraocular movements full without nystagmus. Visual fields full to confrontation. Hearing mildly diminished on the right. Facial sensation intact. Face, tongue, palate moves normally and symmetrically.  Motor: Normal bulk and tone. Normal strength in all tested extremity muscles. Sensory.: intact to touch , pinprick , position and vibratory sensation.  Coordination: Rapid alternating movements normal in all extremities. Finger-to-nose and heel-to-shin performed accurately bilaterally. Gait and Station: Arises from chair without difficulty. Stance is normal. Gait demonstrates normal stride length and balance . Able to heel, toe and tandem walk with mild difficulty.  Reflexes: 1+ and symmetric. Toes downgoing.       ASSESSMENT: 73 year old Caucasian lady with sudden episode of fall with questionable brief loss of consciousness of  unclear etiology.  Possibilities include vertebrobasilar TIA given prior history of stroke.  Seizure would be less likely.  Recent work-up for cardiac etiology  of syncope has been unrewarding.  She has remote history of right MCA branch infarct in 2007 and has undergone endovascular PFO closure.  Remote history of benign paroxysmal positional vertigo with some recent transient symptoms.     PLAN: I had a long discussion with the patient with regards to her episode of brief loss of consciousness which remains of unclear etiology and she has had negative neurovascular work-up, EEG as well as cardiac work-up.  I recommend she continue aspirin for stroke prevention and maintain aggressive risk factor modification given her prior history of stroke.  Keeps blood pressure goal below 130/90, LDL goal below 70 mg percent and diabetes with hemoglobin A1c goal below 6.5%.  I recommend she start taking Crestor 2.5 mg daily along with co-Q10 200 mg daily to achieve her LDL target.  She does have a remote history of statin myalgias in the past but agrees to discuss starting statin with her upcoming visit with her primary care physician.  She will also keep her appointment for ongoing vestibular stabilization exercises for her benign paroxysmal positional vertigo.  No scheduled follow-up appointment with me is necessary but she may return in the future if needed  Greater than 50% time during this 25-minute   visit was spent on counseling and coordination of care about her episode of fall, prior stroke, BPPV and answering questions she will return for follow-up in 2 months or call earlier if necessary. Antony Contras, MD  [facility] [address] [place],  [address]  Phone [phone] Fax [phone] Note: This document was prepared with digital dictation and possible smart phrase technology. Any transcriptional errors that result from this process are unintentional.

## 2018-11-27 DIAGNOSIS — H02423 Myogenic ptosis of bilateral eyelids: Secondary | ICD-10-CM | POA: Diagnosis not present

## 2018-11-27 DIAGNOSIS — H02835 Dermatochalasis of left lower eyelid: Secondary | ICD-10-CM | POA: Diagnosis not present

## 2018-11-27 DIAGNOSIS — H57813 Brow ptosis, bilateral: Secondary | ICD-10-CM | POA: Diagnosis not present

## 2018-11-27 DIAGNOSIS — H02831 Dermatochalasis of right upper eyelid: Secondary | ICD-10-CM | POA: Diagnosis not present

## 2018-11-27 DIAGNOSIS — H0279 Other degenerative disorders of eyelid and periocular area: Secondary | ICD-10-CM | POA: Diagnosis not present

## 2018-11-27 DIAGNOSIS — H53483 Generalized contraction of visual field, bilateral: Secondary | ICD-10-CM | POA: Diagnosis not present

## 2018-11-27 DIAGNOSIS — H02413 Mechanical ptosis of bilateral eyelids: Secondary | ICD-10-CM | POA: Diagnosis not present

## 2018-11-27 DIAGNOSIS — D1801 Hemangioma of skin and subcutaneous tissue: Secondary | ICD-10-CM | POA: Diagnosis not present

## 2018-11-27 DIAGNOSIS — H02832 Dermatochalasis of right lower eyelid: Secondary | ICD-10-CM | POA: Diagnosis not present

## 2018-11-27 DIAGNOSIS — H02834 Dermatochalasis of left upper eyelid: Secondary | ICD-10-CM | POA: Diagnosis not present

## 2018-12-01 DIAGNOSIS — H8113 Benign paroxysmal vertigo, bilateral: Secondary | ICD-10-CM | POA: Diagnosis not present

## 2018-12-02 DIAGNOSIS — H53483 Generalized contraction of visual field, bilateral: Secondary | ICD-10-CM | POA: Diagnosis not present

## 2018-12-02 DIAGNOSIS — H53482 Generalized contraction of visual field, left eye: Secondary | ICD-10-CM | POA: Diagnosis not present

## 2018-12-02 DIAGNOSIS — H53481 Generalized contraction of visual field, right eye: Secondary | ICD-10-CM | POA: Diagnosis not present

## 2018-12-08 DIAGNOSIS — L258 Unspecified contact dermatitis due to other agents: Secondary | ICD-10-CM | POA: Diagnosis not present

## 2018-12-08 DIAGNOSIS — H8113 Benign paroxysmal vertigo, bilateral: Secondary | ICD-10-CM | POA: Diagnosis not present

## 2018-12-14 DIAGNOSIS — E559 Vitamin D deficiency, unspecified: Secondary | ICD-10-CM | POA: Diagnosis not present

## 2018-12-14 DIAGNOSIS — Z85828 Personal history of other malignant neoplasm of skin: Secondary | ICD-10-CM | POA: Diagnosis not present

## 2018-12-14 DIAGNOSIS — D649 Anemia, unspecified: Secondary | ICD-10-CM | POA: Diagnosis not present

## 2018-12-14 DIAGNOSIS — J309 Allergic rhinitis, unspecified: Secondary | ICD-10-CM | POA: Diagnosis not present

## 2018-12-14 DIAGNOSIS — Z8673 Personal history of transient ischemic attack (TIA), and cerebral infarction without residual deficits: Secondary | ICD-10-CM | POA: Diagnosis not present

## 2018-12-14 DIAGNOSIS — Z0000 Encounter for general adult medical examination without abnormal findings: Secondary | ICD-10-CM | POA: Diagnosis not present

## 2018-12-14 DIAGNOSIS — G250 Essential tremor: Secondary | ICD-10-CM | POA: Diagnosis not present

## 2018-12-14 DIAGNOSIS — K589 Irritable bowel syndrome without diarrhea: Secondary | ICD-10-CM | POA: Diagnosis not present

## 2018-12-14 DIAGNOSIS — R7303 Prediabetes: Secondary | ICD-10-CM | POA: Diagnosis not present

## 2018-12-14 DIAGNOSIS — F419 Anxiety disorder, unspecified: Secondary | ICD-10-CM | POA: Diagnosis not present

## 2018-12-14 DIAGNOSIS — M8588 Other specified disorders of bone density and structure, other site: Secondary | ICD-10-CM | POA: Diagnosis not present

## 2018-12-14 DIAGNOSIS — E782 Mixed hyperlipidemia: Secondary | ICD-10-CM | POA: Diagnosis not present

## 2018-12-17 DIAGNOSIS — H8113 Benign paroxysmal vertigo, bilateral: Secondary | ICD-10-CM | POA: Diagnosis not present

## 2018-12-22 ENCOUNTER — Other Ambulatory Visit: Payer: Self-pay | Admitting: Physician Assistant

## 2018-12-28 DIAGNOSIS — M8580 Other specified disorders of bone density and structure, unspecified site: Secondary | ICD-10-CM | POA: Diagnosis not present

## 2018-12-28 DIAGNOSIS — Z20828 Contact with and (suspected) exposure to other viral communicable diseases: Secondary | ICD-10-CM | POA: Diagnosis not present

## 2018-12-31 DIAGNOSIS — Z20828 Contact with and (suspected) exposure to other viral communicable diseases: Secondary | ICD-10-CM | POA: Diagnosis not present

## 2019-01-09 DIAGNOSIS — Z20828 Contact with and (suspected) exposure to other viral communicable diseases: Secondary | ICD-10-CM | POA: Diagnosis not present

## 2019-01-22 DIAGNOSIS — D235 Other benign neoplasm of skin of trunk: Secondary | ICD-10-CM | POA: Diagnosis not present

## 2019-01-29 ENCOUNTER — Other Ambulatory Visit: Payer: Self-pay | Admitting: Physician Assistant

## 2019-02-01 ENCOUNTER — Telehealth: Payer: Self-pay | Admitting: Gastroenterology

## 2019-02-02 DIAGNOSIS — H04123 Dry eye syndrome of bilateral lacrimal glands: Secondary | ICD-10-CM | POA: Diagnosis not present

## 2019-02-02 DIAGNOSIS — Z961 Presence of intraocular lens: Secondary | ICD-10-CM | POA: Diagnosis not present

## 2019-02-02 MED ORDER — GLYCOPYRROLATE 1 MG PO TABS: 1.0000 mg | ORAL_TABLET | Freq: Two times a day (BID) | ORAL | 0 refills | Status: DC

## 2019-02-02 NOTE — Telephone Encounter (Signed)
Prescription sent to patient's pharmacy until scheduled appt. 

## 2019-03-09 ENCOUNTER — Encounter: Payer: Self-pay | Admitting: Gastroenterology

## 2019-03-09 ENCOUNTER — Ambulatory Visit (INDEPENDENT_AMBULATORY_CARE_PROVIDER_SITE_OTHER): Payer: Medicare Other | Admitting: Gastroenterology

## 2019-03-09 VITALS — BP 110/80 | HR 68 | Temp 97.4°F | Ht 60.0 in | Wt 168.0 lb

## 2019-03-09 DIAGNOSIS — R1310 Dysphagia, unspecified: Secondary | ICD-10-CM

## 2019-03-09 DIAGNOSIS — K580 Irritable bowel syndrome with diarrhea: Secondary | ICD-10-CM | POA: Diagnosis not present

## 2019-03-09 DIAGNOSIS — Z1159 Encounter for screening for other viral diseases: Secondary | ICD-10-CM

## 2019-03-09 DIAGNOSIS — K219 Gastro-esophageal reflux disease without esophagitis: Secondary | ICD-10-CM | POA: Diagnosis not present

## 2019-03-09 DIAGNOSIS — Z20828 Contact with and (suspected) exposure to other viral communicable diseases: Secondary | ICD-10-CM | POA: Diagnosis not present

## 2019-03-09 DIAGNOSIS — Z800 Family history of malignant neoplasm of digestive organs: Secondary | ICD-10-CM | POA: Diagnosis not present

## 2019-03-09 MED ORDER — GLYCOPYRROLATE 1 MG PO TABS: 1.0000 mg | ORAL_TABLET | Freq: Two times a day (BID) | ORAL | 11 refills | Status: DC

## 2019-03-09 NOTE — Patient Instructions (Signed)
We have sent the following medications to your pharmacy for you to pick up at your convenience: glycopyrrolate.   You have been scheduled for a Barium Esophogram at [facility] (1st floor of the hospital) on 03/11/19 at 11:00am. Please arrive 15 minutes prior to your appointment for registration. Make certain not to have anything to eat or drink 3 hours prior to your test. If you need to reschedule for any reason, please contact radiology at [phone] to do so. __________________________________________________________________ A barium swallow is an examination that concentrates on views of the esophagus. This tends to be a double contrast exam (barium and two liquids which, when combined, create a gas to distend the wall of the oesophagus) or single contrast (non-ionic iodine based). The study is usually tailored to your symptoms so a good history is essential. Attention is paid during the study to the form, structure and configuration of the esophagus, looking for functional disorders (such as aspiration, dysphagia, achalasia, motility and reflux) EXAMINATION You may be asked to change into a gown, depending on the type of swallow being performed. A radiologist and radiographer will perform the procedure. The radiologist will advise you of the type of contrast selected for your procedure and direct you during the exam. You will be asked to stand, sit or lie in several different positions and to hold a small amount of fluid in your mouth before being asked to swallow while the imaging is performed .In some instances you may be asked to swallow barium coated marshmallows to assess the motility of a solid food bolus. The exam can be recorded as a digital or video fluoroscopy procedure. POST PROCEDURE It will take 1-2 days for the barium to pass through your system. To facilitate this, it is important, unless otherwise directed, to increase your fluids for the next 24-48hrs and to resume your  normal diet.  This test typically takes about 30 minutes to perform. _________________________________________________________________  Dennis Bast have been scheduled for an endoscopy. Please follow written instructions given to you at your visit today. If you use inhalers (even only as needed), please bring them with you on the day of your procedure. Your physician has requested that you go to www.startemmi.com and enter the access code given to you at your visit today. This web site gives a general overview about your procedure. However, you should still follow specific instructions given to you by our office regarding your preparation for the procedure.  Thank you for choosing me and [facility].  Pricilla Riffle. Dagoberto Ligas., MD., [facility]  Due to recent changes in healthcare laws, you may see the results of your imaging and laboratory studies on MyChart before your provider has had a chance to review them.  We understand that in some cases there may be results that are confusing or concerning to you. Not all laboratory results come back in the same time frame and the provider may be waiting for multiple results in order to interpret others.  Please give [place] 48 hours in order for your provider to thoroughly review all the results before contacting the office for clarification of your results.

## 2019-03-09 NOTE — Progress Notes (Signed)
    History of Present Illness: This is a 74 year old female with IBS-D and GERD. EGD in 2001 showed a non obstructing Schatzki ring.  For the past year she relates intermittent problems with choking coughing and difficulty swallowing.  Most frequently her symptoms are associated with liquids and particularly cold or liquids and carbonated beverages.  Reflux symptoms are well controlled on omeprazole 40 mg twice daily.  Current Medications, Allergies, Past Medical History, Past Surgical History, Family History and Social History were reviewed in [employer] record.   Physical Exam: General: Well developed, well nourished, no acute distress Head: Normocephalic and atraumatic Eyes:  sclerae anicteric, EOMI Ears: Normal auditory acuity Mouth: No deformity or lesions Lungs: Clear throughout to auscultation Heart: Regular rate and rhythm; no murmurs, rubs or bruits Abdomen: Soft, non tender and non distended. No masses, hepatosplenomegaly or hernias noted. Normal Bowel sounds Rectal: Not done Musculoskeletal: Symmetrical with no gross deformities  Pulses:  Normal pulses noted Extremities: No clubbing, cyanosis, edema or deformities noted Neurological: Alert oriented x 4, grossly nonfocal Psychological:  Alert and cooperative. Normal mood and affect   Assessment and Recommendations:  1. Dysphagia primarily to liquids for 1 year. GERD. Continue omeprazole 40 mg po bid. Avoid cold and carbonated beverages. Schedule barium esophagram. Schedule EGD with possible dilation. The risks (including bleeding, perforation, infection, missed lesions, medication reactions and possible hospitalization or surgery if complications occur), benefits, and alternatives to endoscopy with possible biopsy and possible dilation were discussed with the patient and they consent to proceed.   2. IBS-D, controlled.  Continue glycopyrrolate 1 mg p.o. twice daily.  Avoid foods that trigger symptoms.   REV in 1 year.   3. Family history of colon cancer. Consider colonoscopy in 10/2022.

## 2019-03-11 ENCOUNTER — Ambulatory Visit (HOSPITAL_COMMUNITY)
Admission: RE | Admit: 2019-03-11 | Discharge: 2019-03-11 | Disposition: A | Discharge status: Home / Self Care | Payer: Medicare Other | Source: Ambulatory Visit | Attending: Gastroenterology | Admitting: Gastroenterology

## 2019-03-11 ENCOUNTER — Other Ambulatory Visit: Payer: Self-pay

## 2019-03-11 DIAGNOSIS — K219 Gastro-esophageal reflux disease without esophagitis: Secondary | ICD-10-CM | POA: Diagnosis not present

## 2019-03-11 DIAGNOSIS — R1310 Dysphagia, unspecified: Secondary | ICD-10-CM | POA: Diagnosis not present

## 2019-03-16 ENCOUNTER — Other Ambulatory Visit: Payer: Self-pay | Admitting: Gastroenterology

## 2019-03-16 ENCOUNTER — Ambulatory Visit (INDEPENDENT_AMBULATORY_CARE_PROVIDER_SITE_OTHER): Payer: Medicare Other

## 2019-03-16 DIAGNOSIS — Z1159 Encounter for screening for other viral diseases: Secondary | ICD-10-CM | POA: Diagnosis not present

## 2019-03-16 LAB — SARS CORONAVIRUS 2 (TAT 6-24 HRS): SARS Coronavirus 2: NEGATIVE

## 2019-03-18 ENCOUNTER — Encounter: Payer: Self-pay | Admitting: Gastroenterology

## 2019-03-18 ENCOUNTER — Other Ambulatory Visit: Payer: Self-pay

## 2019-03-18 ENCOUNTER — Ambulatory Visit (AMBULATORY_SURGERY_CENTER): Payer: Medicare Other | Admitting: Gastroenterology

## 2019-03-18 VITALS — BP 142/80 | HR 68 | Temp 97.8°F | Resp 18 | Ht 60.0 in | Wt 168.0 lb

## 2019-03-18 DIAGNOSIS — K222 Esophageal obstruction: Secondary | ICD-10-CM

## 2019-03-18 DIAGNOSIS — K219 Gastro-esophageal reflux disease without esophagitis: Secondary | ICD-10-CM | POA: Diagnosis not present

## 2019-03-18 DIAGNOSIS — K3189 Other diseases of stomach and duodenum: Secondary | ICD-10-CM | POA: Diagnosis not present

## 2019-03-18 DIAGNOSIS — R1310 Dysphagia, unspecified: Secondary | ICD-10-CM

## 2019-03-18 DIAGNOSIS — K317 Polyp of stomach and duodenum: Secondary | ICD-10-CM

## 2019-03-18 MED ORDER — SODIUM CHLORIDE 0.9 % IV SOLN: 500.0000 mL | Freq: Once | INTRAVENOUS | Status: DC

## 2019-03-18 NOTE — Progress Notes (Signed)
PT taken to PACU. Monitors in place. VSS. Report given to RN. 

## 2019-03-18 NOTE — Patient Instructions (Signed)
YOU HAD AN ENDOSCOPIC PROCEDURE TODAY AT [facility]:   Refer to the procedure report that was given to you for any specific questions about what was found during the examination.  If the procedure report does not answer your questions, please call your gastroenterologist to clarify.  If you requested that your care partner not be given the details of your procedure findings, then the procedure report has been included in a sealed envelope for you to review at your convenience later.  YOU SHOULD EXPECT: Some feelings of bloating in the abdomen. Passage of more gas than usual.  Walking can help get rid of the air that was put into your GI tract during the procedure and reduce the bloating. If you had a lower endoscopy (such as a colonoscopy or flexible sigmoidoscopy) you may notice spotting of blood in your stool or on the toilet paper. If you underwent a bowel prep for your procedure, you may not have a normal bowel movement for a few days.  Please Note:  You might notice some irritation and congestion in your nose or some drainage.  This is from the oxygen used during your procedure.  There is no need for concern and it should clear up in a day or so.  SYMPTOMS TO REPORT IMMEDIATELY:    Following upper endoscopy (EGD)  Vomiting of blood or coffee ground material  New chest pain or pain under the shoulder blades  Painful or persistently difficult swallowing  New shortness of breath  Fever of 100F or higher  Black, tarry-looking stools  For urgent or emergent issues, a gastroenterologist can be reached at any hour by calling [phone].   DIET:  Follow a Post-Dilation diet (see handout given to you by your recovery nurse): Nothing by mouth until 11:30am, clear liquids only from 11:30am to 1:30pm, then soft diet for the rest of the day today. You can advance to a regular diet tomorrow morning as tolerated.  Drink plenty of fluids but you should avoid alcoholic beverages for  24 hours.  MEDICATIONS: Continue present medications.  ACTIVITY:  You should plan to take it easy for the rest of today and you should NOT DRIVE or use heavy machinery until tomorrow (because of the sedation medicines used during the test).    FOLLOW UP: Our staff will call the number listed on your records 48-72 hours following your procedure to check on you and address any questions or concerns that you may have regarding the information given to you following your procedure. If we do not reach you, we will leave a message.  We will attempt to reach you two times.  During this call, we will ask if you have developed any symptoms of COVID 19. If you develop any symptoms (ie: fever, flu-like symptoms, shortness of breath, cough etc.) before then, please call [phone].  If you test positive for Covid 19 in the 2 weeks post procedure, please call and report this information to [place].    If any biopsies were taken you will be contacted by phone or by letter within the next 1-3 weeks.  Please call us at [phone] if you have not heard about the biopsies in 3 weeks.   Thank you for allowing [place] to provide for your healthcare needs today.   SIGNATURES/CONFIDENTIALITY: You and/or your care partner have signed paperwork which will be entered into your electronic medical record.  These signatures attest to the fact that that the information above on your  After Visit Summary has been reviewed and is understood.  Full responsibility of the confidentiality of this discharge information lies with you and/or your care-partner.

## 2019-03-18 NOTE — Op Note (Signed)
[facility] Patient Name: Erica Alvarez Procedure Date: 03/18/2019 10:02 AM MRN: [id number] Endoscopist: Ladene Artist , MD Age: 74 Referring MD:  Date of Birth: June 05, 1945 Gender: Female Account #: [id number] Procedure:                Upper GI endoscopy Indications:              Dysphagia, Gastroesophageal reflux disease Medicines:                Monitored Anesthesia Care Procedure:                Pre-Anesthesia Assessment:                           - Prior to the procedure, a History and Physical                            was performed, and patient medications and                            allergies were reviewed. The patient's tolerance of                            previous anesthesia was also reviewed. The risks                            and benefits of the procedure and the sedation                            options and risks were discussed with the patient.                            All questions were answered, and informed consent                            was obtained. Prior Anticoagulants: The patient has                            taken no previous anticoagulant or antiplatelet                            agents. ASA Grade Assessment: II - A patient with                            mild systemic disease. After reviewing the risks                            and benefits, the patient was deemed in                            satisfactory condition to undergo the procedure.                           After obtaining informed consent, the endoscope was  passed under direct vision. Throughout the                            procedure, the patient's blood pressure, pulse, and                            oxygen saturations were monitored continuously. The                            Endoscope was introduced through the mouth, and                            advanced to the second part of duodenum. The upper                            GI  endoscopy was accomplished without difficulty.                            The patient tolerated the procedure well. Scope In: Scope Out: Findings:                 One benign-appearing, intrinsic mild stenosis was                            found at the gastroesophageal junction. This                            stenosis measured 1.4 cm (inner diameter). The                            stenosis was traversed. A guidewire was placed and                            the scope was withdrawn. Dilations were performed                            with Savary dilators with mild resistance at 15 mm,                            16 mm and 17 mm. No heme noted                           The exam of the esophagus was otherwise normal.                           A few 4 to 5 mm sessile polyps with no bleeding and                            no stigmata of recent bleeding were found in the                            gastric body. Biopsies were taken with a cold  forceps for histology.                           Localized mild inflammation characterized by                            erosions and erythema was found in the prepyloric                            region of the stomach. Biopsies were taken with a                            cold forceps for histology.                           The exam of the stomach was otherwise normal.                           The duodenal bulb and second portion of the                            duodenum were normal. Complications:            No immediate complications. Estimated Blood Loss:     Estimated blood loss was minimal. Impression:               - Benign-appearing esophageal stenosis. Dilated.                           - A few gastric polyps. Biopsied.                           - Gastritis. Biopsied.                           - Normal duodenal bulb and second portion of the                            duodenum. Recommendation:           - Patient  has a contact number available for                            emergencies. The signs and symptoms of potential                            delayed complications were discussed with the                            patient. Return to normal activities tomorrow.                            Written discharge instructions were provided to the                            patient.                           -  Clear liquid diet for 2 hours, then advance as                            tolerated to soft diet today.                           - Resume prior diet tomorrow.                           - Continue present medications.                           - Await pathology results. Ladene Artist, MD 03/18/2019 10:30:11 AM This report has been signed electronically.

## 2019-03-18 NOTE — Progress Notes (Signed)
Called to room to assist during endoscopic procedure.  Patient ID and intended procedure confirmed with present staff. Received instructions for my participation in the procedure from the performing physician.  

## 2019-03-18 NOTE — Progress Notes (Signed)
Temp-CSZ VS-CW

## 2019-03-19 ENCOUNTER — Telehealth: Payer: Self-pay | Admitting: Gastroenterology

## 2019-03-19 NOTE — Telephone Encounter (Signed)
Patient reports nausea this am. She is s/p EGD with bx and dil yesterday.  She is reassured that nausea is most likely not from EGD and she should stay on a clear liquid diet until her nausea passes and advance her diet as tolerated  Once the nauseas passes.  She will call back with any additional questions or concerns.

## 2019-03-19 NOTE — Telephone Encounter (Signed)
Patient had procedure yesterday and is not feeling well this morning says she is very nauseous.

## 2019-03-20 ENCOUNTER — Telehealth: Payer: Self-pay | Admitting: Physician Assistant

## 2019-03-20 MED ORDER — ONDANSETRON HCL 4 MG PO TABS: 4.0000 mg | ORAL_TABLET | Freq: Four times a day (QID) | ORAL | 1 refills | Status: DC | PRN

## 2019-03-22 ENCOUNTER — Telehealth: Payer: Self-pay

## 2019-03-22 NOTE — Telephone Encounter (Signed)
  Follow up Call-  Call back number 03/18/2019 10/22/2017  Post procedure Call Back phone  # [phone]  Permission to leave phone message Yes Yes  Some recent data might be hidden     Patient questions:  Do you have a fever, pain , or abdominal swelling? No. Pain Score  0 *  Have you tolerated food without any problems? No.  Have you been able to return to your normal activities? No.  Do you have any questions about your discharge instructions: Diet   No. Medications  No. Follow up visit  No.  Do you have questions or concerns about your Care? Yes.    Actions: * If pain score is 4 or above: No action needed, pain <4.  Pt reported that she began having nausea the afternoon of her procedure after drinking liquids.  She has an ache above her belly button and it goes across left & right sides.  She said it is not a pain, but described it as "an ache".  She is eating food and being careful not to eat greasy foods.  Pt said this weekend she called and reported nausea.   ZOFRAN was called in for Pt which she said helps some, but the nausea comes back.  Pt  said she did not remember speaking with you the day of the procedure and she wants to speak with you.  1. Have you developed a fever since your procedure? No   2.   Have you had an respiratory symptoms (SOB or cough) since your procedure? No   3.   Have you tested positive for COVID 19 since your procedure no  4.   Have you had any family members/close contacts diagnosed with the COVID 19 since your procedure?  no   If yes to any of these questions please route to Joylene John, RN and Alphonsa Gin, Therapist, sports.

## 2019-03-22 NOTE — Telephone Encounter (Signed)
Returned her call. Persistent nausea since procedure. Zofran started over weekend which has helped. Asked her to hold fenofibrate, MVI and Omega-3 for 3 days. Follow a low fat, lactose free and no raw fruits / vegetables diet restrictions until symptoms resolve. Continue omeprazole, glycopyrrolate as prescribed. Call in 2-3 days if symptoms not resolving.

## 2019-03-23 ENCOUNTER — Encounter: Payer: Self-pay | Admitting: Gastroenterology

## 2019-03-23 NOTE — Telephone Encounter (Signed)
Pt calling back states she is no better and that she is still very nauseous.

## 2019-03-24 ENCOUNTER — Telehealth: Payer: Self-pay

## 2019-03-24 NOTE — Telephone Encounter (Signed)
As nausea is improving will continue to give this a little more time to improve. No additional recommendations.

## 2019-03-24 NOTE — Telephone Encounter (Signed)
Patient with significant post procedure nausea. See earlier telephone encounters for details. She reports today she feels a little better. She feels she is making positive progress. She feels okay after eating a pancake today. She has advanced her diet only slightly. She is craving a chicken sandwich.  Stopped Zofran yesterday "because it made me dizzy drunk." Are there any new recommendations?

## 2019-03-24 NOTE — Telephone Encounter (Signed)
Spoke with the patient. She thanks me for the follow up call.

## 2019-04-26 DIAGNOSIS — L820 Inflamed seborrheic keratosis: Secondary | ICD-10-CM | POA: Diagnosis not present

## 2019-04-26 DIAGNOSIS — Z1283 Encounter for screening for malignant neoplasm of skin: Secondary | ICD-10-CM | POA: Diagnosis not present

## 2019-04-26 DIAGNOSIS — B078 Other viral warts: Secondary | ICD-10-CM | POA: Diagnosis not present

## 2019-05-12 DIAGNOSIS — Z1231 Encounter for screening mammogram for malignant neoplasm of breast: Secondary | ICD-10-CM | POA: Diagnosis not present

## 2019-06-02 DIAGNOSIS — H9201 Otalgia, right ear: Secondary | ICD-10-CM | POA: Diagnosis not present

## 2019-06-02 DIAGNOSIS — H6123 Impacted cerumen, bilateral: Secondary | ICD-10-CM | POA: Diagnosis not present

## 2019-06-02 DIAGNOSIS — Z9089 Acquired absence of other organs: Secondary | ICD-10-CM | POA: Diagnosis not present

## 2019-06-02 DIAGNOSIS — J301 Allergic rhinitis due to pollen: Secondary | ICD-10-CM | POA: Diagnosis not present

## 2019-06-02 DIAGNOSIS — K219 Gastro-esophageal reflux disease without esophagitis: Secondary | ICD-10-CM | POA: Diagnosis not present

## 2019-06-22 ENCOUNTER — Encounter: Payer: Self-pay | Admitting: Podiatry

## 2019-06-22 ENCOUNTER — Other Ambulatory Visit: Payer: Self-pay

## 2019-06-22 ENCOUNTER — Ambulatory Visit (INDEPENDENT_AMBULATORY_CARE_PROVIDER_SITE_OTHER): Payer: Medicare Other | Admitting: Podiatry

## 2019-06-22 DIAGNOSIS — G5782 Other specified mononeuropathies of left lower limb: Secondary | ICD-10-CM

## 2019-06-22 DIAGNOSIS — G5762 Lesion of plantar nerve, left lower limb: Secondary | ICD-10-CM

## 2019-06-22 NOTE — Progress Notes (Signed)
She presents today for for follow-up of arthritic dorsal foot and plantar fasciitis right foot.  States that the left one has really been bothering me and states that the plantar fasciitis is resolving.  Objective: Vital signs are stable alert and oriented x3.  Pulses are palpable.  She has pain on palpation third interdigital space of the left foot with a palpable Mulder's click.  Assessment: Neuroma third interspace left.  Plan: Injected first dose of cortisone 10 mg into the third interspace tolerated procedure well follow-up with her as needed

## 2019-07-20 ENCOUNTER — Encounter: Payer: Self-pay | Admitting: Podiatry

## 2019-07-20 ENCOUNTER — Other Ambulatory Visit: Payer: Self-pay

## 2019-07-20 ENCOUNTER — Ambulatory Visit (INDEPENDENT_AMBULATORY_CARE_PROVIDER_SITE_OTHER): Payer: Medicare Other | Admitting: Podiatry

## 2019-07-20 DIAGNOSIS — M7752 Other enthesopathy of left foot: Secondary | ICD-10-CM

## 2019-07-20 DIAGNOSIS — M778 Other enthesopathies, not elsewhere classified: Secondary | ICD-10-CM

## 2019-07-20 NOTE — Progress Notes (Signed)
She presents today states that currently right now my back and my foot are doing much better I am 100% improved at this point.  Objective: Vital signs are stable alert oriented x3.  There is no erythema edema cellulitis drainage or odor no pain on palpation of the plantar calcaneal tubercle nor the third interdigital space left.  Assessment: Resolving neuroma plantar fasciitis.  Plan: Follow-up with me as needed.

## 2019-08-12 DIAGNOSIS — H53483 Generalized contraction of visual field, bilateral: Secondary | ICD-10-CM | POA: Diagnosis not present

## 2019-08-12 DIAGNOSIS — H02832 Dermatochalasis of right lower eyelid: Secondary | ICD-10-CM | POA: Diagnosis not present

## 2019-08-12 DIAGNOSIS — H57813 Brow ptosis, bilateral: Secondary | ICD-10-CM | POA: Diagnosis not present

## 2019-08-12 DIAGNOSIS — H02413 Mechanical ptosis of bilateral eyelids: Secondary | ICD-10-CM | POA: Diagnosis not present

## 2019-08-12 DIAGNOSIS — H02831 Dermatochalasis of right upper eyelid: Secondary | ICD-10-CM | POA: Diagnosis not present

## 2019-08-12 DIAGNOSIS — D1801 Hemangioma of skin and subcutaneous tissue: Secondary | ICD-10-CM | POA: Diagnosis not present

## 2019-08-12 DIAGNOSIS — H02834 Dermatochalasis of left upper eyelid: Secondary | ICD-10-CM | POA: Diagnosis not present

## 2019-08-12 DIAGNOSIS — H0279 Other degenerative disorders of eyelid and periocular area: Secondary | ICD-10-CM | POA: Diagnosis not present

## 2019-08-12 DIAGNOSIS — H02423 Myogenic ptosis of bilateral eyelids: Secondary | ICD-10-CM | POA: Diagnosis not present

## 2019-08-12 DIAGNOSIS — H02835 Dermatochalasis of left lower eyelid: Secondary | ICD-10-CM | POA: Diagnosis not present

## 2019-08-24 ENCOUNTER — Telehealth: Payer: Self-pay | Admitting: Gastroenterology

## 2019-08-24 ENCOUNTER — Telehealth: Payer: Self-pay

## 2019-08-24 NOTE — Telephone Encounter (Signed)
Patient is calling states she is having diarrhea seeking help

## 2019-08-24 NOTE — Telephone Encounter (Signed)
Telephone tag with patient. So I Left a  detailed message with Dr. Lynne Leader recommendations and appt. With Dr. Fuller Plan tomorrow at 3:20pm

## 2019-08-24 NOTE — Telephone Encounter (Signed)
Called patient back and she states she has had diarrhea for 4-5 days, with 4-5 episodes a day. No n/v, no fever, no blood. Liquids do not go right through her, but any solids go right through. She has taken Imodium 2-3 times a day and some Pepto Bismol with very little relief. Please advise

## 2019-08-24 NOTE — Telephone Encounter (Signed)
Left message for patient to please call back. 

## 2019-08-24 NOTE — Telephone Encounter (Signed)
Bland, lactose free, minimal caffeine, no raw fruits, no raw vegetable diet Schedule office visit with me or APP to evaluate Continue Imodium for diarrhea. Minimize or avoid PeptoBismol

## 2019-08-25 ENCOUNTER — Other Ambulatory Visit: Payer: Medicare Other

## 2019-08-25 ENCOUNTER — Ambulatory Visit (INDEPENDENT_AMBULATORY_CARE_PROVIDER_SITE_OTHER): Payer: Medicare Other | Admitting: Gastroenterology

## 2019-08-25 ENCOUNTER — Encounter: Payer: Self-pay | Admitting: Gastroenterology

## 2019-08-25 VITALS — BP 120/70 | HR 80 | Ht 60.0 in | Wt 167.8 lb

## 2019-08-25 DIAGNOSIS — Z800 Family history of malignant neoplasm of digestive organs: Secondary | ICD-10-CM | POA: Diagnosis not present

## 2019-08-25 DIAGNOSIS — R197 Diarrhea, unspecified: Secondary | ICD-10-CM | POA: Diagnosis not present

## 2019-08-25 MED ORDER — GLYCOPYRROLATE 1 MG PO TABS: ORAL_TABLET | ORAL | 6 refills | Status: DC

## 2019-08-25 NOTE — Progress Notes (Signed)
° ° °  History of Present Illness: This is a 74 year old female with IBS-D who relates worsening problems with diarrhea.  She generally has between 2 and 5 looser bowel movements per day.  Often they are very urgent.  She is unsure what leads to the variation in stool frequency.  This weekend she had severe diarrhea with incontinence on Sunday while visiting her son and daughter-in-law [place].  The evening before she had a cherry pie and she generally does not eat cherries.  She is concerned this may have precipitated her symptoms.  Since then her bowel pattern has returned to a more normal pattern after taking Imodium and Pepto-Bismol.  She contacted our office yesterday advised a bland lactose-free minimal caffeine diet with no raw fruits and no raw vegetables.  We advised her to minimize or avoid Pepto-Bismol and continue Imodium.  She also notes frequent RUQ and lower abdominal discomfort that is partially relieved with glycopyrrolate.  She relates she was placed on antibiotics yesterday for UTI but has not had any antibiotic exposure in the past few months.  No exposure to any people with diarrheal illnesses that she is aware of.  No nausea, vomiting, fevers, chills, malaise or body aches.  Current Medications, Allergies, Past Medical History, Past Surgical History, Family History and Social History were reviewed in [employer] record.  Physical Exam: General: Well developed, well nourished, no acute distress Head: Normocephalic and atraumatic Eyes:  sclerae anicteric, EOMI Ears: Normal auditory acuity Mouth: Not examined, mask on during Covid-19 pandemic Lungs: Clear throughout to auscultation Heart: Regular rate and rhythm; no murmurs, rubs or bruits Abdomen: Soft, mild lower abdominal tenderness and non distended. No masses, hepatosplenomegaly or hernias noted. Normal Bowel sounds Rectal: Not done Musculoskeletal: Symmetrical with no gross deformities  Pulses:  Normal  pulses noted Extremities: No clubbing, cyanosis, edema or deformities noted Neurological: Alert oriented x 4, grossly nonfocal Psychological:  Alert and cooperative. Normal mood and affect   Assessment and Recommendations:  1.  Self-limited exacerbation of diarrhea with baseline IBS-D.  Rule out infectious causes.  Obtain stool GI profile.  Continue current diet restrictions as listed in HPI and advance as tolerated.  Avoid foods that trigger symptoms long-term.  Increase glycopyrrolate to 2 mg p.o. twice daily for long term mgmt of IBS-D.  Increase Imodium to 1 to 2 p.o. 3 times daily as needed. REV in 2 months if all symptoms not well controlled.   2.  GERD with history of esophageal stricture.  EGD with dilation in January 2021.  Follow antireflux measures and continue omeprazole 40 mg p.o. twice daily.  Zofran 4 mg 4 times daily as needed nausea vomiting.  3. Family history of colon cancer.  5-year interval screening colonoscopy recommended in August 2024.

## 2019-08-25 NOTE — Patient Instructions (Addendum)
Your provider has requested that you go to the basement level for lab work before leaving today. Press "B" on the elevator. The lab is located at the first door on the left as you exit the elevator.  We have sent the following medications to your pharmacy for you to pick up at your convenience: Glycopyrrolate   Increase  Glycopyrrolate to 2mg  twice daily. Use current prescription until finish, then you may pick up new prescription.   If you are age 75 or older, your body mass index should be between 23-30. Your Body mass index is 32.77 kg/m. If this is out of the aforementioned range listed, please consider follow up with your Primary Care Provider.  If you are age 30 or younger, your body mass index should be between 19-25. Your Body mass index is 32.77 kg/m. If this is out of the aformentioned range listed, please consider follow up with your Primary Care Provider.    Due to recent changes in healthcare laws, you may see the results of your imaging and laboratory studies on MyChart before your provider has had a chance to review them.  We understand that in some cases there may be results that are confusing or concerning to you. Not all laboratory results come back in the same time frame and the provider may be waiting for multiple results in order to interpret others.  Please give [place] 48 hours in order for your provider to thoroughly review all the results before contacting the office for clarification of your results.   Thank you for choosing me and [facility].  Pricilla Riffle. Dagoberto Ligas., MD., [facility]

## 2019-08-27 ENCOUNTER — Other Ambulatory Visit: Payer: Medicare Other

## 2019-08-27 DIAGNOSIS — R197 Diarrhea, unspecified: Secondary | ICD-10-CM

## 2019-08-30 ENCOUNTER — Other Ambulatory Visit: Payer: Self-pay

## 2019-08-30 DIAGNOSIS — A0472 Enterocolitis due to Clostridium difficile, not specified as recurrent: Secondary | ICD-10-CM

## 2019-08-30 LAB — GI PROFILE, STOOL, PCR

## 2019-08-30 MED ORDER — VANCOMYCIN HCL 125 MG PO CAPS: 125.0000 mg | ORAL_CAPSULE | Freq: Four times a day (QID) | ORAL | 0 refills | Status: DC

## 2019-08-31 ENCOUNTER — Ambulatory Visit (INDEPENDENT_AMBULATORY_CARE_PROVIDER_SITE_OTHER): Payer: Medicare Other

## 2019-08-31 ENCOUNTER — Ambulatory Visit: Payer: Medicare Other | Admitting: Podiatry

## 2019-08-31 ENCOUNTER — Other Ambulatory Visit: Payer: Self-pay

## 2019-08-31 ENCOUNTER — Encounter: Payer: Self-pay | Admitting: Podiatry

## 2019-08-31 ENCOUNTER — Ambulatory Visit (INDEPENDENT_AMBULATORY_CARE_PROVIDER_SITE_OTHER): Payer: Medicare Other | Admitting: Podiatry

## 2019-08-31 DIAGNOSIS — S9001XA Contusion of right ankle, initial encounter: Secondary | ICD-10-CM

## 2019-08-31 DIAGNOSIS — M19072 Primary osteoarthritis, left ankle and foot: Secondary | ICD-10-CM

## 2019-08-31 DIAGNOSIS — S93401A Sprain of unspecified ligament of right ankle, initial encounter: Secondary | ICD-10-CM

## 2019-08-31 NOTE — Progress Notes (Signed)
    Subjective:  Patient ID: Erica Alvarez, female    DOB: [birth year],  MRN: [id number]  Chief Complaint  Patient presents with  . Foot Pain    Follow up dorsal midfoot pain left  "Still having some discomfort"  . Ankle Injury    NEW PROB - Lateral ankle right - fell x 1 week ago, bruised, swollen, tender, using ice    74 y.o. female presents with the above complaint. The history was reviewed and confirmed with the patient.  She has been seeing Dr. Milinda Pointer for left dorsal midfoot pain.  She has a new issue of a lateral ankle injury, she had a mechanical fall from standing at home.  Inverted her right ankle.  She has bruising swelling and tenderness.  She is been using ice.  No treatment thus far  Review of Systems: Negative except as noted in the HPI. Denies N/V/F/Ch. Objective:  There were no vitals filed for this visit.  Lower extremity focused examination: On the left foot there is mild tenderness on the dorsal mid arch over some bony prominence.  On the right foot there is lateral ankle edema with ecchymosis around the subfibular region, tenderness over the ATFL, CFL.  No pain over the navicular, fifth metatarsal base.  Mild fibular tenderness.  No instability noted.  No proximal leg tenderness.  Radiology 3 weightbearing views of the right ankle.  Ankle mortise and alignment is maintained.  No fracture is noted.  Calcification noted in the plantar fat pad beneath calcaneus.   Assessment & Plan:   Encounter Diagnoses  Name Primary?  . Inversion sprain of right ankle, initial encounter Yes  . Primary osteoarthritis of left foot     Patient was evaluated and treated and all questions answered.  -For the left foot her midfoot arthritis is still mildly painful.  She has Voltaren gel that she is using her knees and hands.  She will try using ice on the foot to see if this improves her pain.  She will follow up with Dr. Milinda Pointer for further treatment of this. -We will write that she has  a inversion sprain of the ATFL and CFL without apparent tear on clinical exam.  I reviewed RICE protocol and the nature of sprains as well as treatment, progression, and course of these.  I recommended an initial period of immobilization and support.  A Tri-Lock ankle brace was dispensed today.  Given the lack of fracture and her age,  I do not wish to place her in a CAMboot at this time.  She will return in 4 to 5 weeks with myself or Dr. Milinda Pointer for follow-up of this issue.  If she has any residual pain we will consider physical therapy.   Lanae Crumbly, DPM 08/31/2019    Return in about 6 weeks (around 10/12/2019) for contusion ankle, right foot.

## 2019-09-04 ENCOUNTER — Inpatient Hospital Stay (HOSPITAL_BASED_OUTPATIENT_CLINIC_OR_DEPARTMENT_OTHER)
Admission: EM | Admit: 2019-09-04 | Discharge: 2019-09-13 | DRG: 392 | Disposition: A | Discharge status: Home / Self Care | Payer: Medicare Other | Attending: Surgery | Admitting: Surgery

## 2019-09-04 ENCOUNTER — Other Ambulatory Visit: Payer: Self-pay

## 2019-09-04 ENCOUNTER — Encounter (HOSPITAL_BASED_OUTPATIENT_CLINIC_OR_DEPARTMENT_OTHER): Payer: Self-pay

## 2019-09-04 DIAGNOSIS — Z885 Allergy status to narcotic agent status: Secondary | ICD-10-CM

## 2019-09-04 DIAGNOSIS — Z881 Allergy status to other antibiotic agents status: Secondary | ICD-10-CM | POA: Diagnosis not present

## 2019-09-04 DIAGNOSIS — A0472 Enterocolitis due to Clostridium difficile, not specified as recurrent: Secondary | ICD-10-CM | POA: Diagnosis not present

## 2019-09-04 DIAGNOSIS — Z800 Family history of malignant neoplasm of digestive organs: Secondary | ICD-10-CM

## 2019-09-04 DIAGNOSIS — K5700 Diverticulitis of small intestine with perforation and abscess without bleeding: Principal | ICD-10-CM | POA: Diagnosis present

## 2019-09-04 DIAGNOSIS — Z87891 Personal history of nicotine dependence: Secondary | ICD-10-CM | POA: Diagnosis not present

## 2019-09-04 DIAGNOSIS — E876 Hypokalemia: Secondary | ICD-10-CM | POA: Diagnosis present

## 2019-09-04 DIAGNOSIS — K219 Gastro-esophageal reflux disease without esophagitis: Secondary | ICD-10-CM | POA: Diagnosis present

## 2019-09-04 DIAGNOSIS — Z9841 Cataract extraction status, right eye: Secondary | ICD-10-CM

## 2019-09-04 DIAGNOSIS — Z888 Allergy status to other drugs, medicaments and biological substances status: Secondary | ICD-10-CM

## 2019-09-04 DIAGNOSIS — K5710 Diverticulosis of small intestine without perforation or abscess without bleeding: Secondary | ICD-10-CM | POA: Diagnosis present

## 2019-09-04 DIAGNOSIS — Z86718 Personal history of other venous thrombosis and embolism: Secondary | ICD-10-CM

## 2019-09-04 DIAGNOSIS — Z96653 Presence of artificial knee joint, bilateral: Secondary | ICD-10-CM | POA: Diagnosis present

## 2019-09-04 DIAGNOSIS — Z825 Family history of asthma and other chronic lower respiratory diseases: Secondary | ICD-10-CM

## 2019-09-04 DIAGNOSIS — I700 Atherosclerosis of aorta: Secondary | ICD-10-CM | POA: Diagnosis not present

## 2019-09-04 DIAGNOSIS — Z961 Presence of intraocular lens: Secondary | ICD-10-CM | POA: Diagnosis present

## 2019-09-04 DIAGNOSIS — Z20822 Contact with and (suspected) exposure to covid-19: Secondary | ICD-10-CM | POA: Diagnosis present

## 2019-09-04 DIAGNOSIS — Z8249 Family history of ischemic heart disease and other diseases of the circulatory system: Secondary | ICD-10-CM

## 2019-09-04 DIAGNOSIS — Z8719 Personal history of other diseases of the digestive system: Secondary | ICD-10-CM

## 2019-09-04 DIAGNOSIS — Z882 Allergy status to sulfonamides status: Secondary | ICD-10-CM

## 2019-09-04 DIAGNOSIS — K5732 Diverticulitis of large intestine without perforation or abscess without bleeding: Secondary | ICD-10-CM | POA: Diagnosis not present

## 2019-09-04 DIAGNOSIS — Z8774 Personal history of (corrected) congenital malformations of heart and circulatory system: Secondary | ICD-10-CM

## 2019-09-04 DIAGNOSIS — Z90710 Acquired absence of both cervix and uterus: Secondary | ICD-10-CM

## 2019-09-04 DIAGNOSIS — K668 Other specified disorders of peritoneum: Secondary | ICD-10-CM | POA: Diagnosis not present

## 2019-09-04 DIAGNOSIS — E785 Hyperlipidemia, unspecified: Secondary | ICD-10-CM | POA: Diagnosis present

## 2019-09-04 DIAGNOSIS — Z9049 Acquired absence of other specified parts of digestive tract: Secondary | ICD-10-CM

## 2019-09-04 DIAGNOSIS — Z85828 Personal history of other malignant neoplasm of skin: Secondary | ICD-10-CM

## 2019-09-04 DIAGNOSIS — Z8673 Personal history of transient ischemic attack (TIA), and cerebral infarction without residual deficits: Secondary | ICD-10-CM

## 2019-09-04 DIAGNOSIS — Z880 Allergy status to penicillin: Secondary | ICD-10-CM

## 2019-09-04 DIAGNOSIS — E739 Lactose intolerance, unspecified: Secondary | ICD-10-CM | POA: Diagnosis present

## 2019-09-04 DIAGNOSIS — R259 Unspecified abnormal involuntary movements: Secondary | ICD-10-CM | POA: Diagnosis present

## 2019-09-04 DIAGNOSIS — G250 Essential tremor: Secondary | ICD-10-CM | POA: Diagnosis present

## 2019-09-04 DIAGNOSIS — Z9842 Cataract extraction status, left eye: Secondary | ICD-10-CM

## 2019-09-04 DIAGNOSIS — K589 Irritable bowel syndrome without diarrhea: Secondary | ICD-10-CM | POA: Diagnosis present

## 2019-09-04 DIAGNOSIS — F411 Generalized anxiety disorder: Secondary | ICD-10-CM | POA: Diagnosis present

## 2019-09-04 LAB — LACTIC ACID, PLASMA: Lactic Acid, Venous: 1.9 mmol/L (ref 0.5–1.9)

## 2019-09-04 MED ORDER — SODIUM CHLORIDE 0.9 % IV SOLN
1000.0000 mL | INTRAVENOUS | Status: DC
  Administered 2019-09-05 – 2019-09-07 (×6): 1000 mL via INTRAVENOUS
  Administered 2019-09-07: 500 mL via INTRAVENOUS
  Administered 2019-09-08: 1000 mL via INTRAVENOUS

## 2019-09-04 MED ORDER — FENTANYL CITRATE (PF) 100 MCG/2ML IJ SOLN
50.0000 ug | Freq: Once | INTRAMUSCULAR | Status: AC
  Administered 2019-09-04: 50 ug via INTRAVENOUS
  Filled 2019-09-04: qty 2

## 2019-09-04 MED ORDER — SODIUM CHLORIDE 0.9 % IV BOLUS (SEPSIS)
1000.0000 mL | Freq: Once | INTRAVENOUS | Status: AC
  Administered 2019-09-04: 1000 mL via INTRAVENOUS

## 2019-09-04 MED ORDER — ONDANSETRON HCL 4 MG/2ML IJ SOLN
4.0000 mg | Freq: Once | INTRAMUSCULAR | Status: AC
  Administered 2019-09-04: 4 mg via INTRAVENOUS
  Filled 2019-09-04: qty 2

## 2019-09-04 NOTE — ED Triage Notes (Signed)
Pt presents with lower abd pain that radiates circumferentially around her back. Pt states the pain is worse after eating and pt has associated nausea.

## 2019-09-04 NOTE — ED Provider Notes (Addendum)
[facility] DEPARTMENT Provider Note  CSN: [id number] Arrival date & time: 09/04/19 2307  Chief Complaint(s) Abdominal Pain  HPI Erica Alvarez is a 74 y.o. female   CC: Abdominal pain  Onset/Duration: Gradual, since 1 PM this afternoon Timing: Constant and gradually worsening Location: Across the lower abdomen radiating to the back bilaterally Quality: Dull ache with cramping features Severity: Moderate to severe Modifying Factors:  Improved by: Nothing  Worsened by: Movement, palpation, and eating Associated Signs/Symptoms:  Pertinent (+): Nausea,  Pertinent (-): Emesis, chest pain, shortness of breath, diarrhea, dysuria, fevers or chills Context: Patient was seen by GI last week for IBS D flare and had GI panel.  On the 28th, it appears that she was prescribed oral vancomycin for likely C. difficile infection.  Patient reports that she was called and told she had a bacterial infection but she was not told which one.  On review of the records, patient with +for c.diff toxin.   HPI  Past Medical History Past Medical History:  Diagnosis Date  . Acute sinus infection    03-14-2014 current taking Z-Pak-- no fever but non-productive cough  . Benign essential tremor    takes inderal  . Cancer (HCC)    skin cancer 2 times  . Cataract    removed  . Diverticulitis   . Family history of adverse reaction to anesthesia    "sister had episode hypotension"  . GERD (gastroesophageal reflux disease)   . History of basal cell carcinoma excision    nose  . History of colon polyps   . History of CVA (cerebrovascular accident) without residual deficits    [id number]  embolic right frontal MCA -- found to have PFO and Atrial Septum aneursym /  s/p  closure of PFO  . History of DVT of lower extremity    2003/left leg  . History of gastroesophageal reflux (GERD)   . Hyperlipidemia   . OA (osteoarthritis)    knees and hands  . Patellar clunk syndrome of left knee    . S/P patent foramen ovale closure dx post cva w/ PFO with atrial septal aneursym   07-01-2005  via Intracardiac echo probe with deployment of CardioSEAL septal occluder  . Stroke [facility] 2007   due to patent foramen ovale  . Tendon tear    left gluteal tendon    Patient Active Problem List   Diagnosis Date Noted  . Dysphagia 02/05/2018  . Laryngopharyngeal reflux (LPR) 02/05/2018  . Degeneration of lumbar intervertebral disc 12/30/2017  . Seasonal allergic rhinitis 08/05/2017  . Trochanteric bursitis of left hip 03/26/2017  . Trochanteric bursitis, left hip 03/26/2017  . Excessive cerumen in ear canal, bilateral 10/01/2016  . Bilateral impacted cerumen 05/29/2016  . 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 OA (osteoarthritis) of hip 04/29/2012  . Postop Acute blood loss anemia 12/12/2011  . Postop Hyponatremia 12/12/2011  . OA (osteoarthritis) of knee 12/10/2011  . ANXIETY STATE, UNSPECIFIED 05/13/2008  . ABDOMINAL PAIN-RUQ 05/13/2008  . DIARRHEA 04/15/2008  . CEREBROVASCULAR ACCIDENT, HX OF 10/23/2007  . HYPERLIPIDEMIA 10/22/2007  . HEMORRHOIDS, INTERNAL 10/22/2007  . EXTERNAL HEMORRHOIDS 10/22/2007  . GERD 10/22/2007  . DIVERTICULOSIS, COLON 10/22/2007  . IBS 10/22/2007  . OSTEOPOROSIS 10/22/2007  . TREMOR 10/22/2007  . COLONIC POLYPS, HX OF 10/22/2007   Home Medication(s) Prior to Admission medications   Medication Sig Start Date End Date Taking? Authorizing Provider  aspirin 81 MG tablet Take 81 mg by mouth daily.    [provider]  cetirizine (ZYRTEC) 10 MG tablet Take 10 mg by mouth daily.    [provider]  clobetasol ointment (TEMOVATE) 0.05 % clobetasol 0.05 % topical ointment    [provider]  diazepam (VALIUM) 5 MG tablet Take 5 mg by mouth daily as needed.    [provider]  fenofibrate micronized (LOFIBRA) 67 MG capsule Take by mouth daily. 03/03/18   [provider]  fluconazole (DIFLUCAN) 150 MG tablet Take 150  mg by mouth as needed.    [provider]  fluticasone Asencion Islam) 50 MCG/ACT nasal spray SMARTSIG:2 Puff(s) Both Nares Every Night 06/02/19   [provider]  glycopyrrolate (ROBINUL) 1 MG tablet Take 2 tablets by mouth twice daily. 08/25/19   Ladene Artist, MD  Multiple Vitamin (MULTIVITAMIN) tablet Take 1 tablet by mouth daily.    [provider]  nitrofurantoin, macrocrystal-monohydrate, (MACROBID) 100 MG capsule Take 100 mg by mouth 2 (two) times daily.    [provider]  Omega-3 Fatty Acids (EQL OMEGA 3 FISH OIL) 1400 MG CAPS Take 1,400 mg by mouth daily.    [provider]  omeprazole (PRILOSEC) 40 MG capsule TAKE 1 CAPSULE TWICE DAILY, 30 MINUTES BEFORE MORNING AND EVENING MEALS. 02/05/18   [provider]  ondansetron (ZOFRAN) 4 MG tablet Take 1 tablet (4 mg total) by mouth every 6 (six) hours as needed for nausea or vomiting. 03/20/19   Esterwood, Amy S, PA-C  Probiotic Product (PHILLIPS COLON HEALTH PO) Take 1 capsule by mouth daily.    [provider]  propranolol (INDERAL) 10 MG tablet Take 10 mg by mouth 2 (two) times daily.     [provider]  vancomycin (VANCOCIN) 125 MG capsule Take 1 capsule (125 mg total) by mouth 4 (four) times daily for 14 days. 08/30/19 09/13/19  Ladene Artist, MD                                                                                                                                    Past Surgical History Past Surgical History:  Procedure Laterality Date  . ABDOMINAL HYSTERECTOMY  1982  . APPENDECTOMY  1960's  . COLONOSCOPY    . EYE SURGERY  2018   cataract extraction with lens placement  /Bil  . KNEE ARTHROSCOPY Bilateral left 02-22-2012/  right 11-03-2008   bilateral 2001  . KNEE ARTHROSCOPY Left 03/22/2015   Procedure: LEFT KNEE ARTHROSCOPY AND;  Surgeon: Gaynelle Arabian, MD;  Location: [facility];  Service: Orthopedics;  Laterality: Left;  . LAPAROSCOPIC  CHOLECYSTECTOMY  1999  . OPEN SURGICAL REPAIR OF GLUTEAL TENDON Left 03/26/2017   Procedure: OPEN SURGICAL REPAIR OF LEFT GLUTEAL TENDON;  Surgeon: Gaynelle Arabian, MD;  Location: [facility];  Service: Orthopedics;  Laterality: Left;  . PATENT FORAMEN OVALE CLOSURE  07-01-2005  dr Einar Gip   via Intracardiac echocardiogram successful placement CardioSEAL septal occluder (PFO  measured about 28mm)  . POLYPECTOMY    . SYNOVECTOMY Left 03/22/2015   Procedure: SYNOVECTOMY;  Surgeon: Gaynelle Arabian, MD;  Location: [facility];  Service: Orthopedics;  Laterality: Left;  . TOENAIL TRIMMING    . TONSILLECTOMY  1960's  . TOTAL HIP ARTHROPLASTY Right 04/29/2012   Procedure: TOTAL HIP ARTHROPLASTY ANTERIOR APPROACH;  Surgeon: [name], MD;  Location: [facility];  Service: Orthopedics;  Laterality: Right;  . TOTAL KNEE ARTHROPLASTY  12/10/2011   Procedure: TOTAL KNEE ARTHROPLASTY;  Surgeon: [name], MD;  Location: [facility];  Service: Orthopedics;  Laterality: Left;  . TOTAL KNEE ARTHROPLASTY Right 08-08-2008  . UPPER GASTROINTESTINAL ENDOSCOPY     Family History Family History  Problem Relation Age of Onset  . Dementia Mother   . Colon cancer Father 27  . Stomach cancer Father   . COPD Sister   . Coronary artery disease Sister   . Esophageal cancer Neg Hx   . Rectal cancer Neg Hx     Social History Social History   Tobacco Use  . Smoking status: Former Smoker    Years: 10.00    Quit date: 03/04/1980    Years since quitting: 39.5  . Smokeless tobacco: Never Used  Vaping Use  . Vaping Use: Never used  Substance Use Topics  . Alcohol use: Yes    Comment: occasional   . Drug use: No   Allergies Hydrocodone, Penicillins, Sulfa antibiotics, Medrol [methylprednisolone], Sulfasalazine, and Tetracycline  Review of Systems Review of Systems All other systems are reviewed and are negative for acute change except as noted in the HPI  Physical Exam Vital Signs  I have reviewed  the triage vital signs BP (!) 181/127   Pulse (!) 102   Temp 98.6 F (37 C) (Oral)   Resp (!) 22   Ht 5' (1.524 m)   Wt 74.8 kg   LMP  (LMP Unknown)   SpO2 95%   BMI 32.22 kg/m   Physical Exam Vitals reviewed.  Constitutional:      General: She is not in acute distress.    Appearance: She is well-developed. She is not diaphoretic.  HENT:     Head: Normocephalic and atraumatic.     Right Ear: External ear normal.     Left Ear: External ear normal.     Nose: Nose normal.  Eyes:     General: No scleral icterus.    Conjunctiva/sclera: Conjunctivae normal.  Neck:     Trachea: Phonation normal.  Cardiovascular:     Rate and Rhythm: Normal rate and regular rhythm.  Pulmonary:     Effort: Pulmonary effort is normal. No respiratory distress.     Breath sounds: No stridor.  Abdominal:     General: There is no distension.     Palpations: Abdomen is soft.     Tenderness: There is abdominal tenderness in the right upper quadrant, right lower quadrant, epigastric area, periumbilical area and suprapubic area. There is no guarding or rebound.  Musculoskeletal:        General: Normal range of motion.     Cervical back: Normal range of motion.  Neurological:     Mental Status: She is alert and oriented to person, place, and time.  Psychiatric:        Behavior: Behavior normal.     ED Results and Treatments Labs (all labs ordered are listed, but only abnormal results are displayed) Labs Reviewed  CBC WITH DIFFERENTIAL/PLATELET - Abnormal; Notable  for the following components:      Result Value   WBC 27.3 (*)    Hemoglobin 10.7 (*)    HCT 35.7 (*)    MCV 75.2 (*)    MCH 22.5 (*)    RDW 18.1 (*)    Platelets 423 (*)    Neutro Abs 22.4 (*)    Monocytes Absolute 1.7 (*)    Abs Immature Granulocytes 0.23 (*)    All other components within normal limits  COMPREHENSIVE METABOLIC PANEL - Abnormal; Notable for the following components:   Glucose, Bld 152 (*)    AST 14 (*)     All other components within normal limits  URINALYSIS, ROUTINE W REFLEX MICROSCOPIC - Abnormal; Notable for the following components:   APPearance CLOUDY (*)    Leukocytes,Ua TRACE (*)    All other components within normal limits  URINALYSIS, MICROSCOPIC (REFLEX) - Abnormal; Notable for the following components:   Bacteria, UA MANY (*)    All other components within normal limits  SARS CORONAVIRUS 2 BY RT PCR (HOSPITAL ORDER, [facility])  URINE CULTURE  CULTURE, BLOOD (ROUTINE X 2)  CULTURE, BLOOD (ROUTINE X 2)  LIPASE, BLOOD  LACTIC ACID, PLASMA  PROTIME-INR                                                                                                                         EKG  EKG Interpretation  Date/Time:  Sunday September 05 2019 01:36:01 EDT Ventricular Rate:  94 PR Interval:    QRS Duration: 84 QT Interval:  342 QTC Calculation: 428 R Axis:   33 Text Interpretation: Sinus rhythm Low voltage, precordial leads No acute changes Confirmed by Addison Lank [phone]) on 09/05/2019 2:34:32 AM      Radiology CT ABDOMEN PELVIS W CONTRAST  Result Date: 09/05/2019 CLINICAL DATA:  Right lower quadrant abdominal pain low EXAM: CT ABDOMEN AND PELVIS WITH CONTRAST TECHNIQUE: Multidetector CT imaging of the abdomen and pelvis was performed using the standard protocol following bolus administration of intravenous contrast. CONTRAST:  129mL OMNIPAQUE IOHEXOL 300 MG/ML  SOLN COMPARISON:  CT 08/24/2015 FINDINGS: Lower chest: Bandlike areas of subsegmental atelectasis and/or scarring are present in the otherwise clear lung bases. Normal heart size. No pericardial effusion. Interatrial occlusion device is noted. Hepatobiliary: No focal liver abnormality is seen. Patient is post cholecystectomy. Slight prominence of the biliary tree likely related to reservoir effect. No calcified intraductal gallstones. Pancreas: Stranding inflammatory changes about the head of the pancreas and  uncinate are favored to be secondary to the duodenal process detailed below. No focal peripancreatic inflammation. Partial fatty replacement of the pancreas. No pancreatic ductal dilatation or discernible pancreatic lesions. Spleen: Normal in size without focal abnormality. Adrenals/Urinary Tract: Adrenal glands. Kidneys enhance and excrete symmetrically and uniformly. Stable mild bilateral symmetric perinephric stranding, a nonspecific finding though may correlate with either age or decreased renal function. No concerning renal masses. No urolithiasis or hydronephrosis. Urinary bladder  is largely decompressed at the time of exam and therefore poorly evaluated by CT imaging. Further obscured by streak artifact from a right hip arthroplasty. No gross abnormality is seen. Stomach/Bowel: Distal esophagus and stomach are unremarkable. Redemonstration of an air and fluid-filled duodenal diverticulum which was present on comparison studies but now appears significantly enlarged from prior measuring up to 3 x 5 x 3.6 cm in size with some mucosal hyperenhancement, surrounding inflammation and focal mural discontinuity seen along the superolateral aspect (5/47). Small amount of extraluminal gas and fluid largely contained within the pancreaticoduodenal groove/retroperitoneum. More distal duodenum is unremarkable. Small bowel thickening or dilatation. Fecalization of the distal small bowel contents may reflect slowed intestinal transit. The appendix is surgically absent. No colonic dilatation or wall thickening. Vascular/Lymphatic: Reactive adenopathy in the upper abdomen and retroperitoneum. No pathologically enlarged abdominopelvic nodes. Minimal plaque in the aorta branch vessels without aneurysm or ectasia. Reproductive: Uterus is surgically absent. No concerning adnexal lesions. Other: Trace fluid, free air and phlegmon within the retroperitoneum centered upon the pancreaticoduodenal groove, as above. No large  intraperitoneal free air or fluid. No organized abscess or collections. No bowel containing hernias. Mild posterior body wall edema. Musculoskeletal: Prior total right hip arthroplasty in expected alignment without acute hardware complication. Multilevel degenerative changes are present in the imaged portions of the spine. Mild degenerative changes in the left hip and pelvis. No acute osseous abnormality or suspicious osseous lesion. IMPRESSION: 1. Perforated duodenal diverticulitis. With small amount of free air and fluid contained in the retroperitoneum, confined predominantly to the pancreaticoduodenal groove. 2. Fecalization of the distal small bowel contents may reflect slowed intestinal transit. 3. Prior cholecystectomy, hysterectomy, appendectomy. 4. Aortic Atherosclerosis (ICD10-I70.0). These results were called by telephone at the time of interpretation on 09/05/2019 at 1:10 am to provider [facility] , who verbally acknowledged these results. Electronically Signed   By: Lovena Le M.D.   On: 09/05/2019 01:10    Pertinent labs & imaging results that were available during my care of the patient were reviewed by me and considered in my medical decision making (see chart for details).  Medications Ordered in ED Medications  sodium chloride 0.9 % bolus 1,000 mL (0 mLs Intravenous Stopped 09/05/19 0101)    Followed by  0.9 %  sodium chloride infusion (1,000 mLs Intravenous New Bag/Given 09/05/19 0102)  ceFEPIme (MAXIPIME) 2 g in sodium chloride 0.9 % 100 mL IVPB (2 g Intravenous New Bag/Given 09/05/19 0137)  metroNIDAZOLE (FLAGYL) IVPB 500 mg (has no administration in time range)  fentaNYL (SUBLIMAZE) injection 50 mcg (has no administration in time range)  ondansetron (ZOFRAN) injection 4 mg (4 mg Intravenous Given 09/04/19 2347)  fentaNYL (SUBLIMAZE) injection 50 mcg (50 mcg Intravenous Given 09/04/19 2349)  iohexol (OMNIPAQUE) 300 MG/ML solution 100 mL (100 mLs Intravenous Contrast Given 09/05/19 0035)  Procedures .Critical Care Performed by: Fatima Blank, MD Authorized by: Fatima Blank, MD    CRITICAL CARE Performed by: Grayce Sessions Marqueze Ramcharan Total critical care time: 75 minutes Critical care time was exclusive of separately billable procedures and treating other patients. Critical care was necessary to treat or prevent imminent or life-threatening deterioration. Critical care was time spent personally by me on the following activities: development of treatment plan with patient and/or surrogate as well as nursing, discussions with consultants, evaluation of patient's response to treatment, examination of patient, obtaining history from patient or surrogate, ordering and performing treatments and interventions, ordering and review of laboratory studies, ordering and review of radiographic studies, pulse oximetry and re-evaluation of patient's condition.    (including critical care time)  Medical Decision Making / ED Course I have reviewed the nursing notes for this encounter and the patient's prior records (if available in EHR or on provided paperwork).   CONSUELLO LASSALLE was evaluated in Emergency Department on 09/05/2019 for the symptoms described in the history of present illness. She was evaluated in the context of the global COVID-19 pandemic, which necessitated consideration that the patient might be at risk for infection with the SARS-CoV-2 virus that causes COVID-19. Institutional protocols and algorithms that pertain to the evaluation of patients at risk for COVID-19 are in a state of rapid change based on information released by regulatory bodies including the CDC and federal and state organizations. These policies and algorithms were followed during the patient's care in the ED.  New abd pain in the setting of C. Diff infection. On oral  Vanc. Diarrhea resolved. AFVSS. Right sided abd TTP. Not peritonitic. Patient was provided with IV fluids,, nausea and pain medicine.  Significant leukocytosis on CBC with stable hemoglobin. No significant electrolyte derangement or renal sufficiency. No evidence of biliary obstruction or pancreatitis. Lactic acid within normal limits. UA not convincing for urinary tract infection  We will obtain a CT scan to further evaluate.  CT notable for perforated duodenal diverticulitis with fluid within the retroperitoneal space.   Empiric IV antibiotics were initiated. Patient made n.p.o. We will consult surgery for admission and further management.  Spoke with Dr. Marcello Moores who requested patient be sent to [facility] ED for their evaluation.  Spoke with Dr. Randal Buba at [facility] ED who will be expecting her arrival.   Was called regarding lack of beds at [facility]. Will admit to [facility] instead.      Final Clinical Impression(s) / ED Diagnoses Final diagnoses:  Diverticulitis of small intestine with perforation and abscess, unspecified bleeding status      This chart was dictated using voice recognition software.  Despite best efforts to proofread,  errors can occur which can change the documentation meaning.       Fatima Blank, MD 09/05/19 [phone]

## 2019-09-05 ENCOUNTER — Encounter (HOSPITAL_BASED_OUTPATIENT_CLINIC_OR_DEPARTMENT_OTHER): Payer: Self-pay | Admitting: Radiology

## 2019-09-05 ENCOUNTER — Emergency Department (HOSPITAL_BASED_OUTPATIENT_CLINIC_OR_DEPARTMENT_OTHER): Payer: Medicare Other

## 2019-09-05 DIAGNOSIS — E785 Hyperlipidemia, unspecified: Secondary | ICD-10-CM | POA: Diagnosis present

## 2019-09-05 DIAGNOSIS — Z888 Allergy status to other drugs, medicaments and biological substances status: Secondary | ICD-10-CM | POA: Diagnosis not present

## 2019-09-05 DIAGNOSIS — K915 Postcholecystectomy syndrome: Secondary | ICD-10-CM | POA: Diagnosis not present

## 2019-09-05 DIAGNOSIS — Z800 Family history of malignant neoplasm of digestive organs: Secondary | ICD-10-CM | POA: Diagnosis not present

## 2019-09-05 DIAGNOSIS — Z86718 Personal history of other venous thrombosis and embolism: Secondary | ICD-10-CM | POA: Diagnosis not present

## 2019-09-05 DIAGNOSIS — Z8249 Family history of ischemic heart disease and other diseases of the circulatory system: Secondary | ICD-10-CM | POA: Diagnosis not present

## 2019-09-05 DIAGNOSIS — Z8719 Personal history of other diseases of the digestive system: Secondary | ICD-10-CM | POA: Diagnosis not present

## 2019-09-05 DIAGNOSIS — K5732 Diverticulitis of large intestine without perforation or abscess without bleeding: Secondary | ICD-10-CM | POA: Diagnosis not present

## 2019-09-05 DIAGNOSIS — Z9049 Acquired absence of other specified parts of digestive tract: Secondary | ICD-10-CM | POA: Diagnosis not present

## 2019-09-05 DIAGNOSIS — Z9841 Cataract extraction status, right eye: Secondary | ICD-10-CM | POA: Diagnosis not present

## 2019-09-05 DIAGNOSIS — K5710 Diverticulosis of small intestine without perforation or abscess without bleeding: Secondary | ICD-10-CM | POA: Diagnosis present

## 2019-09-05 DIAGNOSIS — R110 Nausea: Secondary | ICD-10-CM | POA: Diagnosis not present

## 2019-09-05 DIAGNOSIS — K5700 Diverticulitis of small intestine with perforation and abscess without bleeding: Secondary | ICD-10-CM | POA: Diagnosis not present

## 2019-09-05 DIAGNOSIS — Z9842 Cataract extraction status, left eye: Secondary | ICD-10-CM | POA: Diagnosis not present

## 2019-09-05 DIAGNOSIS — K219 Gastro-esophageal reflux disease without esophagitis: Secondary | ICD-10-CM | POA: Diagnosis present

## 2019-09-05 DIAGNOSIS — K668 Other specified disorders of peritoneum: Secondary | ICD-10-CM | POA: Diagnosis not present

## 2019-09-05 DIAGNOSIS — Z87891 Personal history of nicotine dependence: Secondary | ICD-10-CM | POA: Diagnosis not present

## 2019-09-05 DIAGNOSIS — Z961 Presence of intraocular lens: Secondary | ICD-10-CM | POA: Diagnosis present

## 2019-09-05 DIAGNOSIS — Z85828 Personal history of other malignant neoplasm of skin: Secondary | ICD-10-CM | POA: Diagnosis not present

## 2019-09-05 DIAGNOSIS — Z96653 Presence of artificial knee joint, bilateral: Secondary | ICD-10-CM | POA: Diagnosis present

## 2019-09-05 DIAGNOSIS — Z825 Family history of asthma and other chronic lower respiratory diseases: Secondary | ICD-10-CM | POA: Diagnosis not present

## 2019-09-05 DIAGNOSIS — Z8673 Personal history of transient ischemic attack (TIA), and cerebral infarction without residual deficits: Secondary | ICD-10-CM | POA: Diagnosis not present

## 2019-09-05 DIAGNOSIS — Z882 Allergy status to sulfonamides status: Secondary | ICD-10-CM | POA: Diagnosis not present

## 2019-09-05 DIAGNOSIS — Z881 Allergy status to other antibiotic agents status: Secondary | ICD-10-CM | POA: Diagnosis not present

## 2019-09-05 DIAGNOSIS — I700 Atherosclerosis of aorta: Secondary | ICD-10-CM | POA: Diagnosis not present

## 2019-09-05 DIAGNOSIS — Z885 Allergy status to narcotic agent status: Secondary | ICD-10-CM | POA: Diagnosis not present

## 2019-09-05 DIAGNOSIS — A0472 Enterocolitis due to Clostridium difficile, not specified as recurrent: Secondary | ICD-10-CM | POA: Diagnosis present

## 2019-09-05 DIAGNOSIS — Z880 Allergy status to penicillin: Secondary | ICD-10-CM | POA: Diagnosis not present

## 2019-09-05 DIAGNOSIS — Z20822 Contact with and (suspected) exposure to covid-19: Secondary | ICD-10-CM | POA: Diagnosis present

## 2019-09-05 DIAGNOSIS — Z90710 Acquired absence of both cervix and uterus: Secondary | ICD-10-CM | POA: Diagnosis not present

## 2019-09-05 LAB — CBC
HCT: 33.3 % — ABNORMAL LOW (ref 36.0–46.0)
Hemoglobin: 9.9 g/dL — ABNORMAL LOW (ref 12.0–15.0)
MCH: 22.6 pg — ABNORMAL LOW (ref 26.0–34.0)
MCHC: 29.7 g/dL — ABNORMAL LOW (ref 30.0–36.0)
MCV: 76 fL — ABNORMAL LOW (ref 80.0–100.0)
Platelets: 355 10*3/uL (ref 150–400)
RBC: 4.38 MIL/uL (ref 3.87–5.11)
RDW: 17.8 % — ABNORMAL HIGH (ref 11.5–15.5)
WBC: 25 10*3/uL — ABNORMAL HIGH (ref 4.0–10.5)
nRBC: 0 % (ref 0.0–0.2)

## 2019-09-05 LAB — CBC WITH DIFFERENTIAL/PLATELET
Abs Immature Granulocytes: 0.23 10*3/uL — ABNORMAL HIGH (ref 0.00–0.07)
Basophils Absolute: 0.1 10*3/uL (ref 0.0–0.1)
Basophils Relative: 0 %
Eosinophils Absolute: 0.2 10*3/uL (ref 0.0–0.5)
Eosinophils Relative: 1 %
HCT: 35.7 % — ABNORMAL LOW (ref 36.0–46.0)
Hemoglobin: 10.7 g/dL — ABNORMAL LOW (ref 12.0–15.0)
Immature Granulocytes: 1 %
Lymphocytes Relative: 10 %
Lymphs Abs: 2.8 10*3/uL (ref 0.7–4.0)
MCH: 22.5 pg — ABNORMAL LOW (ref 26.0–34.0)
MCHC: 30 g/dL (ref 30.0–36.0)
MCV: 75.2 fL — ABNORMAL LOW (ref 80.0–100.0)
Monocytes Absolute: 1.7 10*3/uL — ABNORMAL HIGH (ref 0.1–1.0)
Monocytes Relative: 6 %
Neutro Abs: 22.4 10*3/uL — ABNORMAL HIGH (ref 1.7–7.7)
Neutrophils Relative %: 82 %
Platelets: 423 10*3/uL — ABNORMAL HIGH (ref 150–400)
RBC: 4.75 MIL/uL (ref 3.87–5.11)
RDW: 18.1 % — ABNORMAL HIGH (ref 11.5–15.5)
Smear Review: NORMAL
WBC: 27.3 10*3/uL — ABNORMAL HIGH (ref 4.0–10.5)
nRBC: 0 % (ref 0.0–0.2)

## 2019-09-05 LAB — COMPREHENSIVE METABOLIC PANEL
ALT: 13 U/L (ref 0–44)
ALT: 13 U/L (ref 0–44)
AST: 14 U/L — ABNORMAL LOW (ref 15–41)
AST: 16 U/L (ref 15–41)
Albumin: 2.8 g/dL — ABNORMAL LOW (ref 3.5–5.0)
Albumin: 3.6 g/dL (ref 3.5–5.0)
Alkaline Phosphatase: 49 U/L (ref 38–126)
Alkaline Phosphatase: 60 U/L (ref 38–126)
Anion gap: 10 (ref 5–15)
Anion gap: 8 (ref 5–15)
BUN: 10 mg/dL (ref 8–23)
BUN: 15 mg/dL (ref 8–23)
CO2: 22 mmol/L (ref 22–32)
CO2: 26 mmol/L (ref 22–32)
Calcium: 8.4 mg/dL — ABNORMAL LOW (ref 8.9–10.3)
Calcium: 9 mg/dL (ref 8.9–10.3)
Chloride: 104 mmol/L (ref 98–111)
Chloride: 108 mmol/L (ref 98–111)
Creatinine, Ser: 0.73 mg/dL (ref 0.44–1.00)
Creatinine, Ser: 0.75 mg/dL (ref 0.44–1.00)
GFR calc Af Amer: >60 mL/min (ref >60)
GFR calc Af Amer: >60 mL/min (ref >60)
GFR calc non Af Amer: >60 mL/min (ref >60)
GFR calc non Af Amer: >60 mL/min (ref >60)
Glucose, Bld: 131 mg/dL — ABNORMAL HIGH (ref 70–99)
Glucose, Bld: 152 mg/dL — ABNORMAL HIGH (ref 70–99)
Potassium: 4 mmol/L (ref 3.5–5.1)
Potassium: 4.5 mmol/L (ref 3.5–5.1)
Sodium: 138 mmol/L (ref 135–145)
Sodium: 140 mmol/L (ref 135–145)
Total Bilirubin: 0.4 mg/dL (ref 0.3–1.2)
Total Bilirubin: 0.6 mg/dL (ref 0.3–1.2)
Total Protein: 5.2 g/dL — ABNORMAL LOW (ref 6.5–8.1)
Total Protein: 6.5 g/dL (ref 6.5–8.1)

## 2019-09-05 LAB — URINALYSIS, MICROSCOPIC (REFLEX)

## 2019-09-05 LAB — PROTIME-INR
INR: 1 (ref 0.8–1.2)
Prothrombin Time: 12.9 seconds (ref 11.4–15.2)

## 2019-09-05 LAB — LIPASE, BLOOD: Lipase: 41 U/L (ref 11–51)

## 2019-09-05 LAB — SARS CORONAVIRUS 2 BY RT PCR (HOSPITAL ORDER, PERFORMED IN ~~LOC~~ HOSPITAL LAB): SARS Coronavirus 2: NEGATIVE

## 2019-09-05 LAB — URINALYSIS, ROUTINE W REFLEX MICROSCOPIC
Bilirubin Urine: NEGATIVE
Glucose, UA: NEGATIVE mg/dL
Hgb urine dipstick: NEGATIVE
Ketones, ur: NEGATIVE mg/dL
Nitrite: NEGATIVE
Protein, ur: NEGATIVE mg/dL
Specific Gravity, Urine: 1.02 (ref 1.005–1.030)
pH: 7 (ref 5.0–8.0)

## 2019-09-05 MED ORDER — OXYCODONE HCL 5 MG PO TABS
5.0000 mg | ORAL_TABLET | ORAL | Status: DC | PRN
  Administered 2019-09-05 – 2019-09-07 (×6): 5 mg via ORAL
  Filled 2019-09-05 (×7): qty 1

## 2019-09-05 MED ORDER — DIPHENHYDRAMINE HCL 50 MG/ML IJ SOLN
12.5000 mg | Freq: Once | INTRAMUSCULAR | Status: DC
  Filled 2019-09-05: qty 1

## 2019-09-05 MED ORDER — ACETAMINOPHEN 325 MG PO TABS
650.0000 mg | ORAL_TABLET | Freq: Four times a day (QID) | ORAL | Status: DC | PRN
  Administered 2019-09-08 – 2019-09-13 (×5): 650 mg via ORAL
  Filled 2019-09-05 (×5): qty 2

## 2019-09-05 MED ORDER — DEXTROSE-NACL 5-0.45 % IV SOLN: INTRAVENOUS | Status: DC

## 2019-09-05 MED ORDER — FENTANYL CITRATE (PF) 100 MCG/2ML IJ SOLN
50.0000 ug | Freq: Once | INTRAMUSCULAR | Status: AC
  Administered 2019-09-05: 50 ug via INTRAVENOUS
  Filled 2019-09-05: qty 2

## 2019-09-05 MED ORDER — IOHEXOL 300 MG/ML  SOLN
100.0000 mL | Freq: Once | INTRAMUSCULAR | Status: AC | PRN
  Administered 2019-09-05: 100 mL via INTRAVENOUS

## 2019-09-05 MED ORDER — MORPHINE SULFATE (PF) 2 MG/ML IV SOLN
2.0000 mg | INTRAVENOUS | Status: DC | PRN
  Administered 2019-09-05: 2 mg via INTRAVENOUS
  Filled 2019-09-05: qty 1

## 2019-09-05 MED ORDER — METOPROLOL TARTRATE 5 MG/5ML IV SOLN: 5.0000 mg | Freq: Four times a day (QID) | INTRAVENOUS | Status: DC | PRN

## 2019-09-05 MED ORDER — SODIUM CHLORIDE 0.9 % IV SOLN
2.0000 g | Freq: Once | INTRAVENOUS | Status: AC
  Administered 2019-09-05: 2 g via INTRAVENOUS
  Filled 2019-09-05: qty 2

## 2019-09-05 MED ORDER — ENOXAPARIN SODIUM 40 MG/0.4ML ~~LOC~~ SOLN
40.0000 mg | SUBCUTANEOUS | Status: DC
  Administered 2019-09-05 – 2019-09-12 (×8): 40 mg via SUBCUTANEOUS
  Filled 2019-09-05 (×8): qty 0.4

## 2019-09-05 MED ORDER — SODIUM CHLORIDE 0.9 % IV SOLN
2.0000 g | Freq: Two times a day (BID) | INTRAVENOUS | Status: DC
  Administered 2019-09-05 – 2019-09-13 (×17): 2 g via INTRAVENOUS
  Filled 2019-09-05 (×18): qty 2

## 2019-09-05 MED ORDER — ONDANSETRON HCL 4 MG/2ML IJ SOLN
4.0000 mg | Freq: Four times a day (QID) | INTRAMUSCULAR | Status: DC | PRN
  Administered 2019-09-08 – 2019-09-13 (×5): 4 mg via INTRAVENOUS
  Filled 2019-09-05 (×4): qty 2

## 2019-09-05 MED ORDER — METRONIDAZOLE IN NACL 5-0.79 MG/ML-% IV SOLN
500.0000 mg | Freq: Once | INTRAVENOUS | Status: AC
  Administered 2019-09-05: 500 mg via INTRAVENOUS
  Filled 2019-09-05: qty 100

## 2019-09-05 MED ORDER — ONDANSETRON 4 MG PO TBDP
4.0000 mg | ORAL_TABLET | Freq: Four times a day (QID) | ORAL | Status: DC | PRN
  Administered 2019-09-07 – 2019-09-08 (×2): 4 mg via ORAL
  Filled 2019-09-05 (×3): qty 1

## 2019-09-05 MED ORDER — METRONIDAZOLE IN NACL 5-0.79 MG/ML-% IV SOLN
500.0000 mg | Freq: Three times a day (TID) | INTRAVENOUS | Status: DC
  Administered 2019-09-05 – 2019-09-13 (×25): 500 mg via INTRAVENOUS
  Filled 2019-09-05 (×25): qty 100

## 2019-09-05 MED ORDER — ACETAMINOPHEN 650 MG RE SUPP: 650.0000 mg | Freq: Four times a day (QID) | RECTAL | Status: DC | PRN

## 2019-09-05 NOTE — ED Notes (Signed)
Pt encouraged to void; pt said she doesn't have the urge yet, but feels like she will soon

## 2019-09-05 NOTE — Progress Notes (Signed)
Pharmacy Antibiotic Note  Erica Alvarez is a 74 y.o. female admitted on 09/04/2019 with intra-abdominal infection.  Pharmacy has been consulted for Cefepime dosing. WBC is elevated. Renal function ok. Surgery seeing.   Plan: Cefepime 2g IV q12h Flagyl per MD Trend WBC, temp, renal function  F/U infectious work-up   Height: 5' (152.4 cm) Weight: 74.8 kg (165 lb) IBW/kg (Calculated) : 45.5  Temp (24hrs), Avg:99.6 F (37.6 C), Min:98.6 F (37 C), Max:101.6 F (38.7 C)  Recent Labs  Lab 09/04/19 2332  WBC 27.3*  CREATININE 0.73  LATICACIDVEN 1.9    Estimated Creatinine Clearance: 55.7 mL/min (by C-G formula based on SCr of 0.73 mg/dL).    Allergies  Allergen Reactions  . Hydrocodone Itching  . Penicillins Itching, Nausea And Vomiting and Other (See Comments)    Has patient had a PCN reaction causing immediate rash, facial/tongue/throat swelling, SOB or lightheadedness with hypotension: No Has patient had a PCN reaction causing severe rash involving mucus membranes or skin necrosis: No Has patient had a PCN reaction that required hospitalization: No Has patient had a PCN reaction occurring within the last 10 years: No If all of the above answers are "NO", then may proceed with Cephalosporin use.  Rash  . Sulfa Antibiotics Nausea And Vomiting and Hives    itching  . Medrol [Methylprednisolone] Other (See Comments)    Disorientation/ hot flashes  . Sulfasalazine Nausea And Vomiting    itching  . Tetracycline Hives, Itching and Nausea And Vomiting    Narda Bonds, PharmD, BCPS Clinical Pharmacist Phone: [phone]

## 2019-09-05 NOTE — H&P (Addendum)
Reason for Consult: abdominal pain  Erica Alvarez is an 74 y.o. female.  HPI: 37 female with 1 day of abdominal pain and back pain. Pain came on after eating hot dog chili. It was very intense in the back and radiated to the right side. She had nausea. She had fevers overnight. Her pain has lessened since coming to the ED.  Past Medical History:  Diagnosis Date  . Acute sinus infection    03-14-2014 current taking Z-Pak-- no fever but non-productive cough  . Benign essential tremor    takes inderal  . Cancer (HCC)    skin cancer 2 times  . Cataract    removed  . Diverticulitis   . Family history of adverse reaction to anesthesia    "sister had episode hypotension"  . GERD (gastroesophageal reflux disease)   . History of basal cell carcinoma excision    nose  . History of colon polyps   . History of CVA (cerebrovascular accident) without residual deficits    [id number]  embolic right frontal MCA -- found to have PFO and Atrial Septum aneursym /  s/p  closure of PFO  . History of DVT of lower extremity    2003/left leg  . History of gastroesophageal reflux (GERD)   . Hyperlipidemia   . OA (osteoarthritis)    knees and hands  . Patellar clunk syndrome of left knee   . S/P patent foramen ovale closure dx post cva w/ PFO with atrial septal aneursym   07-01-2005  via Intracardiac echo probe with deployment of CardioSEAL septal occluder  . Stroke [facility]) 2007   due to patent foramen ovale  . Tendon tear    left gluteal tendon     Past Surgical History:  Procedure Laterality Date  . ABDOMINAL HYSTERECTOMY  1982  . APPENDECTOMY  1960's  . COLONOSCOPY    . EYE SURGERY  2018   cataract extraction with lens placement  /Bil  . KNEE ARTHROSCOPY Bilateral left 02-22-2012/  right 11-03-2008   bilateral 2001  . KNEE ARTHROSCOPY Left 03/22/2015   Procedure: LEFT KNEE ARTHROSCOPY AND;  Surgeon: Gaynelle Arabian, MD;  Location: [facility];  Service: Orthopedics;   Laterality: Left;  . LAPAROSCOPIC CHOLECYSTECTOMY  1999  . OPEN SURGICAL REPAIR OF GLUTEAL TENDON Left 03/26/2017   Procedure: OPEN SURGICAL REPAIR OF LEFT GLUTEAL TENDON;  Surgeon: Gaynelle Arabian, MD;  Location: [facility];  Service: Orthopedics;  Laterality: Left;  . PATENT FORAMEN OVALE CLOSURE  07-01-2005  dr Einar Gip   via Intracardiac echocardiogram successful placement CardioSEAL septal occluder (PFO measured about 70mm)  . POLYPECTOMY    . SYNOVECTOMY Left 03/22/2015   Procedure: SYNOVECTOMY;  Surgeon: Gaynelle Arabian, MD;  Location: [facility];  Service: Orthopedics;  Laterality: Left;  . TOENAIL TRIMMING    . TONSILLECTOMY  1960's  . TOTAL HIP ARTHROPLASTY Right 04/29/2012   Procedure: TOTAL HIP ARTHROPLASTY ANTERIOR APPROACH;  Surgeon: [name], MD;  Location: [facility];  Service: Orthopedics;  Laterality: Right;  . TOTAL KNEE ARTHROPLASTY  12/10/2011   Procedure: TOTAL KNEE ARTHROPLASTY;  Surgeon: [name], MD;  Location: [facility];  Service: Orthopedics;  Laterality: Left;  . TOTAL KNEE ARTHROPLASTY Right 08-08-2008  . UPPER GASTROINTESTINAL ENDOSCOPY      Family History  Problem Relation Age of Onset  . Dementia Mother   . Colon cancer Father 20  . Stomach cancer Father   . COPD Sister   . Coronary artery  disease Sister   . Esophageal cancer Neg Hx   . Rectal cancer Neg Hx     Social History:  reports that she quit smoking about 39 years ago. She quit after 10.00 years of use. She has never used smokeless tobacco. She reports current alcohol use. She reports that she does not use drugs.  Allergies:  Allergies  Allergen Reactions  . Hydrocodone Itching  . Penicillins Itching, Nausea And Vomiting and Other (See Comments)    Has patient had a PCN reaction causing immediate rash, facial/tongue/throat swelling, SOB or lightheadedness with hypotension: No Has patient had a PCN reaction causing severe rash involving mucus membranes or skin necrosis: No Has  patient had a PCN reaction that required hospitalization: No Has patient had a PCN reaction occurring within the last 10 years: No If all of the above answers are "NO", then may proceed with Cephalosporin use.  Rash  . Sulfa Antibiotics Nausea And Vomiting and Hives    itching  . Medrol [Methylprednisolone] Other (See Comments)    Disorientation/ hot flashes  . Sulfasalazine Nausea And Vomiting    itching  . Tetracycline Hives, Itching and Nausea And Vomiting    Medications: I have reviewed the patient's current medications.  Results for orders placed or performed during the hospital encounter of 09/04/19 (from the past 48 hour(s))  CBC with Differential/Platelet     Status: Abnormal   Collection Time: 09/04/19 11:32 PM  Result Value Ref Range   WBC 27.3 (H) 4.0 - 10.5 K/uL   RBC 4.75 3.87 - 5.11 MIL/uL   Hemoglobin 10.7 (L) 12.0 - 15.0 g/dL   HCT 35.7 (L) 36 - 46 %   MCV 75.2 (L) 80.0 - 100.0 fL   MCH 22.5 (L) 26.0 - 34.0 pg   MCHC 30.0 30.0 - 36.0 g/dL   RDW 18.1 (H) 11.5 - 15.5 %   Platelets 423 (H) 150 - 400 K/uL   nRBC 0.0 0.0 - 0.2 %   Neutrophils Relative % 82 %   Neutro Abs 22.4 (H) 1.7 - 7.7 K/uL   Lymphocytes Relative 10 %   Lymphs Abs 2.8 0.7 - 4.0 K/uL   Monocytes Relative 6 %   Monocytes Absolute 1.7 (H) 0 - 1 K/uL   Eosinophils Relative 1 %   Eosinophils Absolute 0.2 0 - 0 K/uL   Basophils Relative 0 %   Basophils Absolute 0.1 0 - 0 K/uL   WBC Morphology MORPHOLOGY UNREMARKABLE    RBC Morphology MORPHOLOGY UNREMARKABLE    Smear Review Normal platelet morphology    Immature Granulocytes 1 %   Abs Immature Granulocytes 0.23 (H) 0.00 - 0.07 K/uL    Comment: Performed at [facility], [address] [place], [place] [address]  Comprehensive metabolic panel     Status: Abnormal   Collection Time: 09/04/19 11:32 PM  Result Value Ref Range   Sodium 140 135 - 145 mmol/L   Potassium 4.5 3.5 - 5.1 mmol/L   Chloride 104 98 - 111 mmol/L   CO2 26 22 -  32 mmol/L   Glucose, Bld 152 (H) 70 - 99 mg/dL    Comment: Glucose reference range applies only to samples taken after fasting for at least 8 hours.   BUN 15 8 - 23 mg/dL   Creatinine, Ser 0.73 0.44 - 1.00 mg/dL   Calcium 9.0 8.9 - 10.3 mg/dL   Total Protein 6.5 6.5 - 8.1 g/dL   Albumin 3.6 3.5 - 5.0 g/dL  AST 14 (L) 15 - 41 U/L   ALT 13 0 - 44 U/L   Alkaline Phosphatase 60 38 - 126 U/L   Total Bilirubin 0.6 0.3 - 1.2 mg/dL   GFR calc non Af Amer >60 >60 mL/min   GFR calc Af Amer >60 >60 mL/min   Anion gap 10 5 - 15    Comment: Performed at [facility], [address] [place], [place] [address]  Lipase, blood     Status: None   Collection Time: 09/04/19 11:32 PM  Result Value Ref Range   Lipase 41 11 - 51 U/L    Comment: Performed at [facility], [address] [place], [place] [address]  Lactic acid, plasma     Status: None   Collection Time: 09/04/19 11:32 PM  Result Value Ref Range   Lactic Acid, Venous 1.9 0.5 - 1.9 mmol/L    Comment: Performed at [facility], [address] [place], [place] [address]  SARS Coronavirus 2 by RT PCR (hospital order, performed in [facility] hospital lab) Nasopharyngeal Nasopharyngeal Swab     Status: None   Collection Time: 09/05/19 12:05 AM   Specimen: Nasopharyngeal Swab  Result Value Ref Range   SARS Coronavirus 2 NEGATIVE NEGATIVE    Comment: (NOTE) SARS-CoV-2 target nucleic acids are NOT DETECTED.  The SARS-CoV-2 RNA is generally detectable in upper and lower respiratory specimens during the acute phase of infection. The lowest concentration of SARS-CoV-2 viral copies this assay can detect is 250 copies / mL. A negative result does not preclude SARS-CoV-2 infection and should not be used as the sole basis for treatment or other patient management decisions.  A negative result may occur with improper specimen collection / handling, submission of specimen other than nasopharyngeal swab, presence of  viral mutation(s) within the areas targeted by this assay, and inadequate number of viral copies (<250 copies / mL). A negative result must be combined with clinical observations, patient history, and epidemiological information.  Fact Sheet for Patients:   StrictlyIdeas.no  Fact Sheet for Healthcare Providers: BankingDealers.co.za  This test is not yet approved or  cleared by the [place] FDA and has been authorized for detection and/or diagnosis of SARS-CoV-2 by FDA under an Emergency Use Authorization (EUA).  This EUA will remain in effect (meaning this test can be used) for the duration of the COVID-19 declaration under Section 564(b)(1) of the Act, 21 U.S.C. section 360bbb-3(b)(1), unless the authorization is terminated or revoked sooner.  Performed at [facility], [address] [place], [place] [address]   Urinalysis, Routine w reflex microscopic     Status: Abnormal   Collection Time: 09/05/19 12:34 AM  Result Value Ref Range   Color, Urine YELLOW YELLOW   APPearance CLOUDY (A) CLEAR   Specific Gravity, Urine 1.020 1.005 - 1.030   pH 7.0 5.0 - 8.0   Glucose, UA NEGATIVE NEGATIVE mg/dL   Hgb urine dipstick NEGATIVE NEGATIVE   Bilirubin Urine NEGATIVE NEGATIVE   Ketones, ur NEGATIVE NEGATIVE mg/dL   Protein, ur NEGATIVE NEGATIVE mg/dL   Nitrite NEGATIVE NEGATIVE   Leukocytes,Ua TRACE (A) NEGATIVE    Comment: Performed at [facility], [address] [place], [place] [address]  Urinalysis, Microscopic (reflex)     Status: Abnormal   Collection Time: 09/05/19 12:34 AM  Result Value Ref Range   RBC / HPF 0-5 0 - 5 RBC/hpf  WBC, UA 6-10 0 - 5 WBC/hpf   Bacteria, UA MANY (A) NONE SEEN   Squamous Epithelial / LPF 0-5 0 - 5   Amorphous Crystal PRESENT     Comment: Performed at [facility], [address] [place], [place] [address]  Protime-INR     Status: None   Collection Time:  09/05/19  1:22 AM  Result Value Ref Range   Prothrombin Time 12.9 11.4 - 15.2 seconds   INR 1.0 0.8 - 1.2    Comment: (NOTE) INR goal varies based on device and disease states. Performed at [facility], [address] [place], [place] [address]   CBC     Status: Abnormal   Collection Time: 09/05/19  4:51 AM  Result Value Ref Range   WBC 25.0 (H) 4.0 - 10.5 K/uL   RBC 4.38 3.87 - 5.11 MIL/uL   Hemoglobin 9.9 (L) 12.0 - 15.0 g/dL   HCT 33.3 (L) 36 - 46 %   MCV 76.0 (L) 80.0 - 100.0 fL   MCH 22.6 (L) 26.0 - 34.0 pg   MCHC 29.7 (L) 30.0 - 36.0 g/dL   RDW 17.8 (H) 11.5 - 15.5 %   Platelets 355 150 - 400 K/uL   nRBC 0.0 0.0 - 0.2 %    Comment: Performed at [facility], [address] [place], [place] [address]  Comprehensive metabolic panel     Status: Abnormal   Collection Time: 09/05/19  4:51 AM  Result Value Ref Range   Sodium 138 135 - 145 mmol/L   Potassium 4.0 3.5 - 5.1 mmol/L   Chloride 108 98 - 111 mmol/L   CO2 22 22 - 32 mmol/L   Glucose, Bld 131 (H) 70 - 99 mg/dL    Comment: Glucose reference range applies only to samples taken after fasting for at least 8 hours.   BUN 10 8 - 23 mg/dL   Creatinine, Ser 0.75 0.44 - 1.00 mg/dL   Calcium 8.4 (L) 8.9 - 10.3 mg/dL   Total Protein 5.2 (L) 6.5 - 8.1 g/dL   Albumin 2.8 (L) 3.5 - 5.0 g/dL   AST 16 15 - 41 U/L   ALT 13 0 - 44 U/L   Alkaline Phosphatase 49 38 - 126 U/L   Total Bilirubin 0.4 0.3 - 1.2 mg/dL   GFR calc non Af Amer >60 >60 mL/min   GFR calc Af Amer >60 >60 mL/min   Anion gap 8 5 - 15    Comment: Performed at [facility] [address] [place], [place] [address]    CT ABDOMEN PELVIS W CONTRAST  Result Date: 09/05/2019 CLINICAL DATA:  Right lower quadrant abdominal pain low EXAM: CT ABDOMEN AND PELVIS WITH CONTRAST TECHNIQUE: Multidetector CT imaging of the abdomen and pelvis was performed using the standard protocol following bolus administration of intravenous contrast. CONTRAST:   184mL OMNIPAQUE IOHEXOL 300 MG/ML  SOLN COMPARISON:  CT 08/24/2015 FINDINGS: Lower chest: Bandlike areas of subsegmental atelectasis and/or scarring are present in the otherwise clear lung bases. Normal heart size. No pericardial effusion. Interatrial occlusion device is noted. Hepatobiliary: No focal liver abnormality is seen. Patient is post cholecystectomy. Slight prominence of the biliary tree likely related to reservoir effect. No calcified intraductal gallstones. Pancreas: Stranding inflammatory changes about the head of the pancreas and uncinate are favored to be secondary to the duodenal process detailed below. No focal peripancreatic inflammation. Partial fatty replacement of the pancreas. No  pancreatic ductal dilatation or discernible pancreatic lesions. Spleen: Normal in size without focal abnormality. Adrenals/Urinary Tract: Adrenal glands. Kidneys enhance and excrete symmetrically and uniformly. Stable mild bilateral symmetric perinephric stranding, a nonspecific finding though may correlate with either age or decreased renal function. No concerning renal masses. No urolithiasis or hydronephrosis. Urinary bladder is largely decompressed at the time of exam and therefore poorly evaluated by CT imaging. Further obscured by streak artifact from a right hip arthroplasty. No gross abnormality is seen. Stomach/Bowel: Distal esophagus and stomach are unremarkable. Redemonstration of an air and fluid-filled duodenal diverticulum which was present on comparison studies but now appears significantly enlarged from prior measuring up to 3 x 5 x 3.6 cm in size with some mucosal hyperenhancement, surrounding inflammation and focal mural discontinuity seen along the superolateral aspect (5/47). Small amount of extraluminal gas and fluid largely contained within the pancreaticoduodenal groove/retroperitoneum. More distal duodenum is unremarkable. Small bowel thickening or dilatation. Fecalization of the distal small  bowel contents may reflect slowed intestinal transit. The appendix is surgically absent. No colonic dilatation or wall thickening. Vascular/Lymphatic: Reactive adenopathy in the upper abdomen and retroperitoneum. No pathologically enlarged abdominopelvic nodes. Minimal plaque in the aorta branch vessels without aneurysm or ectasia. Reproductive: Uterus is surgically absent. No concerning adnexal lesions. Other: Trace fluid, free air and phlegmon within the retroperitoneum centered upon the pancreaticoduodenal groove, as above. No large intraperitoneal free air or fluid. No organized abscess or collections. No bowel containing hernias. Mild posterior body wall edema. Musculoskeletal: Prior total right hip arthroplasty in expected alignment without acute hardware complication. Multilevel degenerative changes are present in the imaged portions of the spine. Mild degenerative changes in the left hip and pelvis. No acute osseous abnormality or suspicious osseous lesion. IMPRESSION: 1. Perforated duodenal diverticulitis. With small amount of free air and fluid contained in the retroperitoneum, confined predominantly to the pancreaticoduodenal groove. 2. Fecalization of the distal small bowel contents may reflect slowed intestinal transit. 3. Prior cholecystectomy, hysterectomy, appendectomy. 4. Aortic Atherosclerosis (ICD10-I70.0). These results were called by telephone at the time of interpretation on 09/05/2019 at 1:10 am to provider [facility] , who verbally acknowledged these results. Electronically Signed   By: Lovena Le M.D.   On: 09/05/2019 01:10    Review of Systems  Constitutional: Negative for chills and fever.  HENT: Negative for hearing loss.   Eyes: Negative for blurred vision and double vision.  Respiratory: Negative for cough and hemoptysis.   Cardiovascular: Negative for chest pain and palpitations.  Gastrointestinal: Positive for abdominal pain, diarrhea and nausea. Negative for vomiting.   Genitourinary: Negative for dysuria and urgency.  Musculoskeletal: Negative for myalgias and neck pain.  Skin: Negative for itching and rash.  Neurological: Negative for dizziness, tingling and headaches.  Endo/Heme/Allergies: Does not bruise/bleed easily.  Psychiatric/Behavioral: Negative for depression and suicidal ideas.    PE Blood pressure (!) 118/56, pulse 85, temperature 98.4 F (36.9 C), temperature source Oral, resp. rate 18, height 5' (1.524 m), weight 74.9 kg, SpO2 100 %. Constitutional: NAD; conversant; no deformities Eyes: Moist conjunctiva; no lid lag; anicteric; PERRL Neck: Trachea midline; no thyromegaly Lungs: Normal respiratory effort; no tactile fremitus CV: RRR; no palpable thrills; no pitting edema GI: Abd soft, moderately TTP, no guarding; no palpable hepatosplenomegaly MSK: Normal gait; no clubbing/cyanosis Psychiatric: Appropriate affect; alert and oriented x3 Lymphatic: No palpable cervical or axillary lymphadenopathy   Assessment/Plan: 74 yo female with duodenal diverticulum that is inflamed and has air outside the diverticulum. She was  also treated for c diff with vancomycin starting 6/25. Her diarrhea is resolved -IV cefepime flagyl -pain control -bowel rest -discussed possible need for drainage procedure and why surgery can be a very difficult procedure and may warrant further imaging  Arta Bruce Shenica Holzheimer 09/05/2019, 6:23 AM

## 2019-09-05 NOTE — ED Notes (Signed)
Pt's bed assignment changed to [facility]. Pt updated by MD.

## 2019-09-05 NOTE — Progress Notes (Signed)
Pt arrived to unit, alert and oriented x 4. Oriented pt to room. Pt ambulated to the bathroom with 1 assist.

## 2019-09-06 LAB — COMPREHENSIVE METABOLIC PANEL
ALT: 12 U/L (ref 0–44)
AST: 11 U/L — ABNORMAL LOW (ref 15–41)
Albumin: 2.6 g/dL — ABNORMAL LOW (ref 3.5–5.0)
Alkaline Phosphatase: 44 U/L (ref 38–126)
Anion gap: 8 (ref 5–15)
BUN: 8 mg/dL (ref 8–23)
CO2: 21 mmol/L — ABNORMAL LOW (ref 22–32)
Calcium: 8.3 mg/dL — ABNORMAL LOW (ref 8.9–10.3)
Chloride: 108 mmol/L (ref 98–111)
Creatinine, Ser: 0.69 mg/dL (ref 0.44–1.00)
GFR calc Af Amer: >60 mL/min (ref >60)
GFR calc non Af Amer: >60 mL/min (ref >60)
Glucose, Bld: 92 mg/dL (ref 70–99)
Potassium: 3.7 mmol/L (ref 3.5–5.1)
Sodium: 137 mmol/L (ref 135–145)
Total Bilirubin: 0.4 mg/dL (ref 0.3–1.2)
Total Protein: 5.1 g/dL — ABNORMAL LOW (ref 6.5–8.1)

## 2019-09-06 LAB — CBC
HCT: 29.6 % — ABNORMAL LOW (ref 36.0–46.0)
Hemoglobin: 8.8 g/dL — ABNORMAL LOW (ref 12.0–15.0)
MCH: 22.6 pg — ABNORMAL LOW (ref 26.0–34.0)
MCHC: 29.7 g/dL — ABNORMAL LOW (ref 30.0–36.0)
MCV: 75.9 fL — ABNORMAL LOW (ref 80.0–100.0)
Platelets: 331 10*3/uL (ref 150–400)
RBC: 3.9 MIL/uL (ref 3.87–5.11)
RDW: 18.1 % — ABNORMAL HIGH (ref 11.5–15.5)
WBC: 18 10*3/uL — ABNORMAL HIGH (ref 4.0–10.5)
nRBC: 0 % (ref 0.0–0.2)

## 2019-09-06 MED ORDER — PANTOPRAZOLE SODIUM 40 MG IV SOLR
40.0000 mg | Freq: Every day | INTRAVENOUS | Status: DC
  Administered 2019-09-06 – 2019-09-12 (×8): 40 mg via INTRAVENOUS
  Filled 2019-09-06 (×8): qty 40

## 2019-09-06 NOTE — Progress Notes (Signed)
[facility] Progress Note     Subjective: CC:  Mild upper abdominal pain rated stable compared to yesterday. Feels bloated. Denies nausea or emesis. Having flatus but no BM since admission. At baseline has 1-2 BMs daily. Denies CP, SOB, urinary sxs. Also reports some swelling in her hands   Objective: Vital signs in last 24 hours: Temp:  [98.4 F (36.9 C)-98.8 F (37.1 C)] 98.4 F (36.9 C) (07/05 0523) Pulse Rate:  [81-84] 84 (07/05 0523) Resp:  [17-18] 17 (07/05 0523) BP: (118-119)/(52-57) 118/55 (07/05 0523) SpO2:  [96 %-97 %] 97 % (07/05 0523) Last BM Date: 09/04/19  Intake/Output from previous day: 07/04 0701 - 07/05 0700 In: 3171 [I.V.:2671; IV Piggyback:500] Out: 750 [Urine:750] Intake/Output this shift: No intake/output data recorded.  PE: Gen:  Alert, NAD, pleasant, up in chair Card:  Regular rate and rhythm, pedal pulses 2+ BL Pulm:  Normal effort, clear to auscultation bilaterally Abd: Soft, non-tender, mild distention, hypoactive BS, no peritonitis  Skin: warm and dry, no rashes  Psych: A&Ox3   Lab Results:  Recent Labs    09/05/19 0451 09/06/19 0209  WBC 25.0* 18.0*  HGB 9.9* 8.8*  HCT 33.3* 29.6*  PLT 355 331   BMET Recent Labs    09/05/19 0451 09/06/19 0209  NA 138 137  K 4.0 3.7  CL 108 108  CO2 22 21*  GLUCOSE 131* 92  BUN 10 8  CREATININE 0.75 0.69  CALCIUM 8.4* 8.3*   PT/INR Recent Labs    09/05/19 0122  LABPROT 12.9  INR 1.0   CMP     Component Value Date/Time   NA 137 09/06/2019 0209   K 3.7 09/06/2019 0209   CL 108 09/06/2019 0209   CO2 21 (L) 09/06/2019 0209   GLUCOSE 92 09/06/2019 0209   BUN 8 09/06/2019 0209   CREATININE 0.69 09/06/2019 0209   CALCIUM 8.3 (L) 09/06/2019 0209   PROT 5.1 (L) 09/06/2019 0209   ALBUMIN 2.6 (L) 09/06/2019 0209   AST 11 (L) 09/06/2019 0209   ALT 12 09/06/2019 0209   ALKPHOS 44 09/06/2019 0209   BILITOT 0.4 09/06/2019 0209   GFRNONAA >60 09/06/2019 0209   GFRAA >60  09/06/2019 0209   Lipase     Component Value Date/Time   LIPASE 41 09/04/2019 2332       Studies/Results: CT ABDOMEN PELVIS W CONTRAST  Result Date: 09/05/2019 CLINICAL DATA:  Right lower quadrant abdominal pain low EXAM: CT ABDOMEN AND PELVIS WITH CONTRAST TECHNIQUE: Multidetector CT imaging of the abdomen and pelvis was performed using the standard protocol following bolus administration of intravenous contrast. CONTRAST:  182mL OMNIPAQUE IOHEXOL 300 MG/ML  SOLN COMPARISON:  CT 08/24/2015 FINDINGS: Lower chest: Bandlike areas of subsegmental atelectasis and/or scarring are present in the otherwise clear lung bases. Normal heart size. No pericardial effusion. Interatrial occlusion device is noted. Hepatobiliary: No focal liver abnormality is seen. Patient is post cholecystectomy. Slight prominence of the biliary tree likely related to reservoir effect. No calcified intraductal gallstones. Pancreas: Stranding inflammatory changes about the head of the pancreas and uncinate are favored to be secondary to the duodenal process detailed below. No focal peripancreatic inflammation. Partial fatty replacement of the pancreas. No pancreatic ductal dilatation or discernible pancreatic lesions. Spleen: Normal in size without focal abnormality. Adrenals/Urinary Tract: Adrenal glands. Kidneys enhance and excrete symmetrically and uniformly. Stable mild bilateral symmetric perinephric stranding, a nonspecific finding though may correlate with either age or decreased renal function. No concerning renal masses.  No urolithiasis or hydronephrosis. Urinary bladder is largely decompressed at the time of exam and therefore poorly evaluated by CT imaging. Further obscured by streak artifact from a right hip arthroplasty. No gross abnormality is seen. Stomach/Bowel: Distal esophagus and stomach are unremarkable. Redemonstration of an air and fluid-filled duodenal diverticulum which was present on comparison studies but now  appears significantly enlarged from prior measuring up to 3 x 5 x 3.6 cm in size with some mucosal hyperenhancement, surrounding inflammation and focal mural discontinuity seen along the superolateral aspect (5/47). Small amount of extraluminal gas and fluid largely contained within the pancreaticoduodenal groove/retroperitoneum. More distal duodenum is unremarkable. Small bowel thickening or dilatation. Fecalization of the distal small bowel contents may reflect slowed intestinal transit. The appendix is surgically absent. No colonic dilatation or wall thickening. Vascular/Lymphatic: Reactive adenopathy in the upper abdomen and retroperitoneum. No pathologically enlarged abdominopelvic nodes. Minimal plaque in the aorta branch vessels without aneurysm or ectasia. Reproductive: Uterus is surgically absent. No concerning adnexal lesions. Other: Trace fluid, free air and phlegmon within the retroperitoneum centered upon the pancreaticoduodenal groove, as above. No large intraperitoneal free air or fluid. No organized abscess or collections. No bowel containing hernias. Mild posterior body wall edema. Musculoskeletal: Prior total right hip arthroplasty in expected alignment without acute hardware complication. Multilevel degenerative changes are present in the imaged portions of the spine. Mild degenerative changes in the left hip and pelvis. No acute osseous abnormality or suspicious osseous lesion. IMPRESSION: 1. Perforated duodenal diverticulitis. With small amount of free air and fluid contained in the retroperitoneum, confined predominantly to the pancreaticoduodenal groove. 2. Fecalization of the distal small bowel contents may reflect slowed intestinal transit. 3. Prior cholecystectomy, hysterectomy, appendectomy. 4. Aortic Atherosclerosis (ICD10-I70.0). These results were called by telephone at the time of interpretation on 09/05/2019 at 1:10 am to provider [facility] , who verbally acknowledged these results.  Electronically Signed   By: Lovena Le M.D.   On: 09/05/2019 01:10    Anti-infectives: Anti-infectives (From admission, onward)   Start     Dose/Rate Route Frequency Ordered Stop   09/05/19 1000  ceFEPIme (MAXIPIME) 2 g in sodium chloride 0.9 % 100 mL IVPB     Discontinue     2 g 200 mL/hr over 30 Minutes Intravenous Every 12 hours 09/05/19 0434     09/05/19 1000  metroNIDAZOLE (FLAGYL) IVPB 500 mg     Discontinue     500 mg 100 mL/hr over 60 Minutes Intravenous Every 8 hours 09/05/19 0435     09/05/19 0115  ceFEPIme (MAXIPIME) 2 g in sodium chloride 0.9 % 100 mL IVPB        2 g 200 mL/hr over 30 Minutes Intravenous  Once 09/05/19 0111 09/05/19 0207   09/05/19 0115  metroNIDAZOLE (FLAGYL) IVPB 500 mg        500 mg 100 mL/hr over 60 Minutes Intravenous  Once 09/05/19 0111 09/05/19 0310     Assessment/Plan PMH embolic CVA 2703  PMH c.dif treated 6/25 GERD - start pantoprazole  HLD  Duodenal diverticulum with contained perforation - afebrile, VSS, WBC 18 from 25 - abdominal tenderness slowly improving, remains distended, having flatus but no BM - continue bowel rest and plan to start CLD tomorrow if continues to improve clinically  - OOB/mobilize  - AM labs   FEN - NPO, IVF @ 75 cc/hr  ID - cefepime/flagyl  VTE - SCDs, Lovenox  Foley - none Follow up - TBD    LOS: 1  day    Obie Dredge, [facility] Please see Amion for pager number during day hours 7:00am-4:30pm

## 2019-09-07 LAB — URINE CULTURE: Culture: >=100000 — AB

## 2019-09-07 LAB — COMPREHENSIVE METABOLIC PANEL
ALT: 10 U/L (ref 0–44)
AST: 11 U/L — ABNORMAL LOW (ref 15–41)
Albumin: 2.5 g/dL — ABNORMAL LOW (ref 3.5–5.0)
Alkaline Phosphatase: 41 U/L (ref 38–126)
Anion gap: 12 (ref 5–15)
BUN: 6 mg/dL — ABNORMAL LOW (ref 8–23)
CO2: 20 mmol/L — ABNORMAL LOW (ref 22–32)
Calcium: 8.4 mg/dL — ABNORMAL LOW (ref 8.9–10.3)
Chloride: 105 mmol/L (ref 98–111)
Creatinine, Ser: 0.63 mg/dL (ref 0.44–1.00)
GFR calc Af Amer: >60 mL/min (ref >60)
GFR calc non Af Amer: >60 mL/min (ref >60)
Glucose, Bld: 107 mg/dL — ABNORMAL HIGH (ref 70–99)
Potassium: 3.5 mmol/L (ref 3.5–5.1)
Sodium: 137 mmol/L (ref 135–145)
Total Bilirubin: 0.4 mg/dL (ref 0.3–1.2)
Total Protein: 5 g/dL — ABNORMAL LOW (ref 6.5–8.1)

## 2019-09-07 LAB — CBC
HCT: 28.5 % — ABNORMAL LOW (ref 36.0–46.0)
Hemoglobin: 8.6 g/dL — ABNORMAL LOW (ref 12.0–15.0)
MCH: 22.5 pg — ABNORMAL LOW (ref 26.0–34.0)
MCHC: 30.2 g/dL (ref 30.0–36.0)
MCV: 74.6 fL — ABNORMAL LOW (ref 80.0–100.0)
Platelets: 301 10*3/uL (ref 150–400)
RBC: 3.82 MIL/uL — ABNORMAL LOW (ref 3.87–5.11)
RDW: 17.8 % — ABNORMAL HIGH (ref 11.5–15.5)
WBC: 13.8 10*3/uL — ABNORMAL HIGH (ref 4.0–10.5)
nRBC: 0 % (ref 0.0–0.2)

## 2019-09-07 LAB — MAGNESIUM: Magnesium: 1.8 mg/dL (ref 1.7–2.4)

## 2019-09-07 MED ORDER — DIPHENHYDRAMINE HCL 25 MG PO CAPS
25.0000 mg | ORAL_CAPSULE | ORAL | Status: DC | PRN
  Administered 2019-09-07 – 2019-09-09 (×2): 25 mg via ORAL
  Filled 2019-09-07 (×2): qty 1

## 2019-09-07 MED ORDER — DIPHENHYDRAMINE HCL 25 MG PO CAPS
25.0000 mg | ORAL_CAPSULE | Freq: Every evening | ORAL | Status: DC | PRN
  Administered 2019-09-07: 25 mg via ORAL
  Filled 2019-09-07: qty 1

## 2019-09-07 MED ORDER — DIPHENHYDRAMINE HCL 25 MG PO CAPS: 50.0000 mg | ORAL_CAPSULE | Freq: Every evening | ORAL | Status: DC | PRN

## 2019-09-07 NOTE — Progress Notes (Signed)
[facility] Progress Note     Subjective: CC:  abd pain improving slowly. +flatus. BMx2. Sat up in chair yesterday but did not walk.  Objective: Vital signs in last 24 hours: Temp:  [98.1 F (36.7 C)-98.3 F (36.8 C)] 98.1 F (36.7 C) (07/06 0433) Pulse Rate:  [66-73] 73 (07/06 0433) Resp:  [17-18] 18 (07/06 0433) BP: (131-157)/(68-71) 157/71 (07/06 0433) SpO2:  [98 %] 98 % (07/06 0433) Last BM Date: 09/04/19  Intake/Output from previous day: 07/05 0701 - 07/06 0700 In: 3435.9 [I.V.:2914.4; IV Piggyback:521.5] Out: 1400 [Urine:1400] Intake/Output this shift: No intake/output data recorded.  PE: Gen:  Alert, NAD, pleasant, up in chair Card:  Regular rate and rhythm, pedal pulses 2+ BL Pulm:  Normal effort, clear to auscultation bilaterally Abd: Soft, mild epigastric tenderness without guarding/peritonitis, mild distention, +BS, no peritonitis  Skin: warm and dry, no rashes  Psych: A&Ox3   Lab Results:  Recent Labs    09/06/19 0209 09/07/19 0159  WBC 18.0* 13.8*  HGB 8.8* 8.6*  HCT 29.6* 28.5*  PLT 331 301   BMET Recent Labs    09/06/19 0209 09/07/19 0159  NA 137 137  K 3.7 3.5  CL 108 105  CO2 21* 20*  GLUCOSE 92 107*  BUN 8 6*  CREATININE 0.69 0.63  CALCIUM 8.3* 8.4*   PT/INR Recent Labs    09/05/19 0122  LABPROT 12.9  INR 1.0   CMP     Component Value Date/Time   NA 137 09/07/2019 0159   K 3.5 09/07/2019 0159   CL 105 09/07/2019 0159   CO2 20 (L) 09/07/2019 0159   GLUCOSE 107 (H) 09/07/2019 0159   BUN 6 (L) 09/07/2019 0159   CREATININE 0.63 09/07/2019 0159   CALCIUM 8.4 (L) 09/07/2019 0159   PROT 5.0 (L) 09/07/2019 0159   ALBUMIN 2.5 (L) 09/07/2019 0159   AST 11 (L) 09/07/2019 0159   ALT 10 09/07/2019 0159   ALKPHOS 41 09/07/2019 0159   BILITOT 0.4 09/07/2019 0159   GFRNONAA >60 09/07/2019 0159   GFRAA >60 09/07/2019 0159   Lipase     Component Value Date/Time   LIPASE 41 09/04/2019 2332        Studies/Results: No results found.  Anti-infectives: Anti-infectives (From admission, onward)   Start     Dose/Rate Route Frequency Ordered Stop   09/05/19 1000  ceFEPIme (MAXIPIME) 2 g in sodium chloride 0.9 % 100 mL IVPB     Discontinue     2 g 200 mL/hr over 30 Minutes Intravenous Every 12 hours 09/05/19 0434     09/05/19 1000  metroNIDAZOLE (FLAGYL) IVPB 500 mg     Discontinue     500 mg 100 mL/hr over 60 Minutes Intravenous Every 8 hours 09/05/19 0435     09/05/19 0115  ceFEPIme (MAXIPIME) 2 g in sodium chloride 0.9 % 100 mL IVPB        2 g 200 mL/hr over 30 Minutes Intravenous  Once 09/05/19 0111 09/05/19 0207   09/05/19 0115  metroNIDAZOLE (FLAGYL) IVPB 500 mg        500 mg 100 mL/hr over 60 Minutes Intravenous  Once 09/05/19 0111 09/05/19 0310     Assessment/Plan PMH embolic CVA 7893  PMH c.dif treated 6/25 GERD - start pantoprazole  HLD  Duodenal diverticulum with contained perforation - afebrile, VSS, WBC 13.8 from 18 - clinically improving w/ decreased abd tenderness, having flatus and BMx1 - start CLD  - OOB/mobilize  - AM  labs   FEN - NPO, D/C IVF  ID - cefepime/flagyl  VTE - SCDs, Lovenox  Foley - none Follow up - TBD    LOS: 2 days    Obie Dredge, [facility] Please see Amion for pager number during day hours 7:00am-4:30pm

## 2019-09-08 LAB — COMPREHENSIVE METABOLIC PANEL
ALT: 9 U/L (ref 0–44)
AST: 10 U/L — ABNORMAL LOW (ref 15–41)
Albumin: 2.3 g/dL — ABNORMAL LOW (ref 3.5–5.0)
Alkaline Phosphatase: 41 U/L (ref 38–126)
Anion gap: 9 (ref 5–15)
BUN: <5 mg/dL — ABNORMAL LOW (ref 8–23)
CO2: 24 mmol/L (ref 22–32)
Calcium: 8.4 mg/dL — ABNORMAL LOW (ref 8.9–10.3)
Chloride: 106 mmol/L (ref 98–111)
Creatinine, Ser: 0.55 mg/dL (ref 0.44–1.00)
GFR calc Af Amer: >60 mL/min (ref >60)
GFR calc non Af Amer: >60 mL/min (ref >60)
Glucose, Bld: 128 mg/dL — ABNORMAL HIGH (ref 70–99)
Potassium: 3 mmol/L — ABNORMAL LOW (ref 3.5–5.1)
Sodium: 139 mmol/L (ref 135–145)
Total Bilirubin: 0.4 mg/dL (ref 0.3–1.2)
Total Protein: 5 g/dL — ABNORMAL LOW (ref 6.5–8.1)

## 2019-09-08 LAB — CBC
HCT: 28.6 % — ABNORMAL LOW (ref 36.0–46.0)
Hemoglobin: 8.8 g/dL — ABNORMAL LOW (ref 12.0–15.0)
MCH: 22.6 pg — ABNORMAL LOW (ref 26.0–34.0)
MCHC: 30.8 g/dL (ref 30.0–36.0)
MCV: 73.5 fL — ABNORMAL LOW (ref 80.0–100.0)
Platelets: 324 10*3/uL (ref 150–400)
RBC: 3.89 MIL/uL (ref 3.87–5.11)
RDW: 17.7 % — ABNORMAL HIGH (ref 11.5–15.5)
WBC: 13.4 10*3/uL — ABNORMAL HIGH (ref 4.0–10.5)
nRBC: 0 % (ref 0.0–0.2)

## 2019-09-08 LAB — MAGNESIUM: Magnesium: 1.7 mg/dL (ref 1.7–2.4)

## 2019-09-08 MED ORDER — POTASSIUM CHLORIDE CRYS ER 20 MEQ PO TBCR
40.0000 meq | EXTENDED_RELEASE_TABLET | Freq: Two times a day (BID) | ORAL | Status: DC
  Administered 2019-09-08 – 2019-09-10 (×6): 40 meq via ORAL
  Filled 2019-09-08 (×6): qty 2

## 2019-09-08 MED ORDER — SODIUM CHLORIDE 0.9 % IV SOLN
1000.0000 mL | INTRAVENOUS | Status: DC
  Administered 2019-09-08 – 2019-09-09 (×3): 1000 mL via INTRAVENOUS

## 2019-09-08 MED ORDER — POTASSIUM CHLORIDE 10 MEQ/100ML IV SOLN
10.0000 meq | INTRAVENOUS | Status: AC
  Administered 2019-09-08 (×2): 10 meq via INTRAVENOUS
  Filled 2019-09-08 (×2): qty 100

## 2019-09-08 MED ORDER — SALINE SPRAY 0.65 % NA SOLN
1.0000 | NASAL | Status: DC | PRN
  Filled 2019-09-08: qty 44

## 2019-09-08 MED ORDER — POTASSIUM CHLORIDE 10 MEQ/100ML IV SOLN: 10.0000 meq | INTRAVENOUS | Status: DC

## 2019-09-08 MED ORDER — POTASSIUM CHLORIDE 20 MEQ PO PACK: 40.0000 meq | PACK | Freq: Two times a day (BID) | ORAL | Status: DC

## 2019-09-08 NOTE — Progress Notes (Signed)
Pharmacy Antibiotic Note  Erica Alvarez is a 74 y.o. female admitted on 09/04/2019 with intra-abdominal infection.  Pharmacy has been consulted for Cefepime dosing. WBC is elevated. Renal function ok. Surgery seeing.   Blood cultures ngtd, urine culture growing morganella (symptomatic?). Discussed with ID, cefepime should cover if actual pathogen.  Plan: Continue Cefepime 2g IV q12h Continue Flagyl per MD Trend WBC, temp, renal function  F/U infectious work-up   Height: 5' (152.4 cm) Weight: 74.9 kg (165 lb 2 oz) IBW/kg (Calculated) : 45.5  Temp (24hrs), Avg:98.6 F (37 C), Min:97.9 F (36.6 C), Max:99.1 F (37.3 C)  Recent Labs  Lab 09/04/19 2332 09/05/19 0451 09/06/19 0209 09/07/19 0159 09/08/19 0315  WBC 27.3* 25.0* 18.0* 13.8* 13.4*  CREATININE 0.73 0.75 0.69 0.63 0.55  LATICACIDVEN 1.9  --   --   --   --     Estimated Creatinine Clearance: 55.8 mL/min (by C-G formula based on SCr of 0.55 mg/dL).    Allergies  Allergen Reactions  . Hydrocodone Itching  . Penicillins Itching, Nausea And Vomiting and Other (See Comments)    Has patient had a PCN reaction causing immediate rash, facial/tongue/throat swelling, SOB or lightheadedness with hypotension: No Has patient had a PCN reaction causing severe rash involving mucus membranes or skin necrosis: No Has patient had a PCN reaction that required hospitalization: No Has patient had a PCN reaction occurring within the last 10 years: No If all of the above answers are "NO", then may proceed with Cephalosporin use.  Rash  . Sulfa Antibiotics Nausea And Vomiting and Hives    itching  . Medrol [Methylprednisolone] Other (See Comments)    Disorientation/ hot flashes  . Sulfasalazine Nausea And Vomiting    itching  . Tetracycline Hives, Itching and Nausea And Vomiting    Fara Olden, PharmD PGY-1 Resident

## 2019-09-08 NOTE — Plan of Care (Signed)
  Problem: Activity: Goal: Risk for activity intolerance will decrease Outcome: Progressing   Problem: Elimination: Goal: Will not experience complications related to bowel motility Outcome: Progressing   Problem: Pain Managment: Goal: General experience of comfort will improve Outcome: Progressing   

## 2019-09-08 NOTE — Progress Notes (Signed)
[facility] Progress Note     Subjective: CC:  Denies abd pain. Some nausea after PO intake. Denies exacerbation of abd pain with drinking. Denies emesis. Mobilizing. Having multiple, small-volume, non-bloody BMs.  S/o sinus pressure and HA this AM.  Objective: Vital signs in last 24 hours: Temp:  [97.9 F (36.6 C)-99.1 F (37.3 C)] 97.9 F (36.6 C) (07/07 0422) Pulse Rate:  [63-70] 63 (07/07 0422) Resp:  [15-17] 15 (07/07 0422) BP: (135-154)/(68-72) 147/72 (07/07 0422) SpO2:  [95 %-97 %] 95 % (07/07 0422) Last BM Date: 09/08/19  Intake/Output from previous day: 07/06 0701 - 07/07 0700 In: 2407.4 [P.O.:900; I.V.:1307.4; IV Piggyback:200] Out: 1600 [Urine:1600] Intake/Output this shift: Total I/O In: 400 [P.O.:400] Out: 200 [Urine:200]  PE: Gen:  Alert, NAD, pleasant, up in chair Card:  Regular rate and rhythm, pedal pulses 2+ BL Pulm:  Normal effort, clear to auscultation bilaterally Abd: Soft, mild, mostly subjective, epigastric tenderness without guarding, mild distention, +BS, no peritoneal signs  Skin: warm and dry, no rashes  Psych: A&Ox3   Lab Results:  Recent Labs    09/07/19 0159 09/08/19 0315  WBC 13.8* 13.4*  HGB 8.6* 8.8*  HCT 28.5* 28.6*  PLT 301 324   BMET Recent Labs    09/07/19 0159 09/08/19 0315  NA 137 139  K 3.5 3.0*  CL 105 106  CO2 20* 24  GLUCOSE 107* 128*  BUN 6* <5*  CREATININE 0.63 0.55  CALCIUM 8.4* 8.4*   PT/INR No results for input(s): LABPROT, INR in the last 72 hours. CMP     Component Value Date/Time   NA 139 09/08/2019 0315   K 3.0 (L) 09/08/2019 0315   CL 106 09/08/2019 0315   CO2 24 09/08/2019 0315   GLUCOSE 128 (H) 09/08/2019 0315   BUN <5 (L) 09/08/2019 0315   CREATININE 0.55 09/08/2019 0315   CALCIUM 8.4 (L) 09/08/2019 0315   PROT 5.0 (L) 09/08/2019 0315   ALBUMIN 2.3 (L) 09/08/2019 0315   AST 10 (L) 09/08/2019 0315   ALT 9 09/08/2019 0315   ALKPHOS 41 09/08/2019 0315   BILITOT 0.4  09/08/2019 0315   GFRNONAA >60 09/08/2019 0315   GFRAA >60 09/08/2019 0315   Lipase     Component Value Date/Time   LIPASE 41 09/04/2019 2332       Studies/Results: No results found.  Anti-infectives: Anti-infectives (From admission, onward)   Start     Dose/Rate Route Frequency Ordered Stop   09/05/19 1000  ceFEPIme (MAXIPIME) 2 g in sodium chloride 0.9 % 100 mL IVPB     Discontinue     2 g 200 mL/hr over 30 Minutes Intravenous Every 12 hours 09/05/19 0434     09/05/19 1000  metroNIDAZOLE (FLAGYL) IVPB 500 mg     Discontinue     500 mg 100 mL/hr over 60 Minutes Intravenous Every 8 hours 09/05/19 0435     09/05/19 0115  ceFEPIme (MAXIPIME) 2 g in sodium chloride 0.9 % 100 mL IVPB        2 g 200 mL/hr over 30 Minutes Intravenous  Once 09/05/19 0111 09/05/19 0207   09/05/19 0115  metroNIDAZOLE (FLAGYL) IVPB 500 mg        500 mg 100 mL/hr over 60 Minutes Intravenous  Once 09/05/19 0111 09/05/19 0310     Assessment/Plan PMH embolic CVA 1749  PMH c.dif treated 6/25 GERD - start pantoprazole  HLD  Duodenal diverticulum with contained perforation - afebrile, VSS, WBC 13.4 from  13.8 - clinically improving w/ decreased abd tenderness, having flatus and BMs - given nausea with PO intake and mild upper abdominal discomfort, continue CLD and do not advance today - OOB/mobilize  - AM labs   FEN - CLD, IVF at 50 cc/hr; hypokalemia (3.0) replete with PO powder and IV, check Mg level ID - cefepime/flagyl  VTE - SCDs, Lovenox  Foley - none Follow up - TBD   Dispo: CLD, IV abx, replete K, check Mg    LOS: 3 days    Obie Dredge, [facility] Please see Amion for pager number during day hours 7:00am-4:30pm

## 2019-09-09 LAB — COMPREHENSIVE METABOLIC PANEL
ALT: 10 U/L (ref 0–44)
AST: 11 U/L — ABNORMAL LOW (ref 15–41)
Albumin: 2.5 g/dL — ABNORMAL LOW (ref 3.5–5.0)
Alkaline Phosphatase: 43 U/L (ref 38–126)
Anion gap: 8 (ref 5–15)
BUN: <5 mg/dL — ABNORMAL LOW (ref 8–23)
CO2: 25 mmol/L (ref 22–32)
Calcium: 8.6 mg/dL — ABNORMAL LOW (ref 8.9–10.3)
Chloride: 107 mmol/L (ref 98–111)
Creatinine, Ser: 0.5 mg/dL (ref 0.44–1.00)
GFR calc Af Amer: >60 mL/min (ref >60)
GFR calc non Af Amer: >60 mL/min (ref >60)
Glucose, Bld: 151 mg/dL — ABNORMAL HIGH (ref 70–99)
Potassium: 3 mmol/L — ABNORMAL LOW (ref 3.5–5.1)
Sodium: 140 mmol/L (ref 135–145)
Total Bilirubin: 0.2 mg/dL — ABNORMAL LOW (ref 0.3–1.2)
Total Protein: 5.1 g/dL — ABNORMAL LOW (ref 6.5–8.1)

## 2019-09-09 LAB — CBC
HCT: 30.4 % — ABNORMAL LOW (ref 36.0–46.0)
Hemoglobin: 9.2 g/dL — ABNORMAL LOW (ref 12.0–15.0)
MCH: 22.3 pg — ABNORMAL LOW (ref 26.0–34.0)
MCHC: 30.3 g/dL (ref 30.0–36.0)
MCV: 73.8 fL — ABNORMAL LOW (ref 80.0–100.0)
Platelets: 367 10*3/uL (ref 150–400)
RBC: 4.12 MIL/uL (ref 3.87–5.11)
RDW: 17.6 % — ABNORMAL HIGH (ref 11.5–15.5)
WBC: 11.2 10*3/uL — ABNORMAL HIGH (ref 4.0–10.5)
nRBC: 0 % (ref 0.0–0.2)

## 2019-09-09 MED ORDER — KCL IN DEXTROSE-NACL 20-5-0.45 MEQ/L-%-% IV SOLN
INTRAVENOUS | Status: DC
  Filled 2019-09-09 (×5): qty 1000

## 2019-09-09 MED ORDER — MAGNESIUM SULFATE 50 % IJ SOLN
3.0000 g | Freq: Once | INTRAVENOUS | Status: AC
  Administered 2019-09-09: 3 g via INTRAVENOUS
  Filled 2019-09-09: qty 6

## 2019-09-09 MED ORDER — LOPERAMIDE HCL 2 MG PO CAPS
2.0000 mg | ORAL_CAPSULE | ORAL | Status: DC | PRN
  Administered 2019-09-09 – 2019-09-13 (×5): 2 mg via ORAL
  Filled 2019-09-09 (×5): qty 1

## 2019-09-09 MED ORDER — POTASSIUM CHLORIDE 10 MEQ/100ML IV SOLN
10.0000 meq | INTRAVENOUS | Status: AC
  Administered 2019-09-09 (×3): 10 meq via INTRAVENOUS
  Filled 2019-09-09 (×2): qty 100

## 2019-09-09 MED ORDER — POTASSIUM CHLORIDE 20 MEQ PO PACK
40.0000 meq | PACK | Freq: Two times a day (BID) | ORAL | Status: DC
  Administered 2019-09-09 (×2): 40 meq via ORAL
  Filled 2019-09-09 (×3): qty 2

## 2019-09-09 NOTE — Progress Notes (Addendum)
[facility] Progress Note     Subjective: CC:  Cc is diarrhea - it is becoming less frequent but is bothersome. She is having flatus along with her liquid stools. Patient denies abdominal distention or belching. Reports mild nausea usually after eating. Denies vomiting.   She reports food sensitivities to lactose and wheat. Objective: Vital signs in last 24 hours: Temp:  [97.5 F (36.4 C)-98.6 F (37 C)] 98 F (36.7 C) (07/08 0500) Pulse Rate:  [66-78] 78 (07/08 0433) Resp:  [16] 16 (07/08 0433) BP: (126-164)/(71-77) 126/71 (07/08 0433) SpO2:  [97 %-99 %] 97 % (07/08 0433) Last BM Date: 09/08/19  Intake/Output from previous day: 07/07 0701 - 07/08 0700 In: 2668.1 [P.O.:1220; I.V.:416.5; IV Piggyback:1031.7] Out: 2625 [Urine:2625] Intake/Output this shift: No intake/output data recorded.  PE: Gen:  Alert, NAD, pleasant, up on the edge of her bed. Card:  Regular rate and rhythm, pedal pulses 2+ BL Pulm:  Normal effort, clear to auscultation bilaterally Abd: Soft, non-tender, mild distention, +BS, No HSM Skin: warm and dry, no rashes  Psych: A&Ox3   Lab Results:  Recent Labs    09/08/19 0315 09/09/19 0240  WBC 13.4* 11.2*  HGB 8.8* 9.2*  HCT 28.6* 30.4*  PLT 324 367   BMET Recent Labs    09/08/19 0315 09/09/19 0240  NA 139 140  K 3.0* 3.0*  CL 106 107  CO2 24 25  GLUCOSE 128* 151*  BUN <5* <5*  CREATININE 0.55 0.50  CALCIUM 8.4* 8.6*   PT/INR No results for input(s): LABPROT, INR in the last 72 hours. CMP     Component Value Date/Time   NA 140 09/09/2019 0240   K 3.0 (L) 09/09/2019 0240   CL 107 09/09/2019 0240   CO2 25 09/09/2019 0240   GLUCOSE 151 (H) 09/09/2019 0240   BUN <5 (L) 09/09/2019 0240   CREATININE 0.50 09/09/2019 0240   CALCIUM 8.6 (L) 09/09/2019 0240   PROT 5.1 (L) 09/09/2019 0240   ALBUMIN 2.5 (L) 09/09/2019 0240   AST 11 (L) 09/09/2019 0240   ALT 10 09/09/2019 0240   ALKPHOS 43 09/09/2019 0240   BILITOT 0.2 (L)  09/09/2019 0240   GFRNONAA >60 09/09/2019 0240   GFRAA >60 09/09/2019 0240   Lipase     Component Value Date/Time   LIPASE 41 09/04/2019 2332       Studies/Results: No results found.  Anti-infectives: Anti-infectives (From admission, onward)   Start     Dose/Rate Route Frequency Ordered Stop   09/05/19 1000  ceFEPIme (MAXIPIME) 2 g in sodium chloride 0.9 % 100 mL IVPB     Discontinue     2 g 200 mL/hr over 30 Minutes Intravenous Every 12 hours 09/05/19 0434     09/05/19 1000  metroNIDAZOLE (FLAGYL) IVPB 500 mg     Discontinue     500 mg 100 mL/hr over 60 Minutes Intravenous Every 8 hours 09/05/19 0435     09/05/19 0115  ceFEPIme (MAXIPIME) 2 g in sodium chloride 0.9 % 100 mL IVPB        2 g 200 mL/hr over 30 Minutes Intravenous  Once 09/05/19 0111 09/05/19 0207   09/05/19 0115  metroNIDAZOLE (FLAGYL) IVPB 500 mg        500 mg 100 mL/hr over 60 Minutes Intravenous  Once 09/05/19 0111 09/05/19 0310     Assessment/Plan PMH embolic CVA 0962  PMH c.dif treated 6/25 GERD - start pantoprazole  HLD  Duodenal diverticulum with contained perforation -  afebrile, VSS, WBC 11 from 13 - clinically improving w/ decreased abd tenderness, having flatus and BMs - still having mild nausea, continue PRN antiemetics and advance diet to FLD. Patient and I discussed cautious advancement of diet and she agrees to go back to liquids/jello if FLD causes her abd pain or increased nausea. - OOB/mobilize  - AM labs   FEN - FLD, IVF at 50 cc/hr; hypokalemia (3.0) replete with PO powder and IV, hypomagnesemia (1.7) replete IV ID - cefepime/flagyl  VTE - SCDs, Lovenox  Foley - none Follow up - TBD   Dispo: FLD, IV abx, replete K/Mg Today is hospital day#5, if patients diet is unable to be advanced safely in the next 1-2 days then we will need to consider PICC/TNA   LOS: 4 days    Obie Dredge, [facility] Please see Amion for pager number during day hours  7:00am-4:30pm

## 2019-09-10 LAB — CBC
HCT: 29.1 % — ABNORMAL LOW (ref 36.0–46.0)
Hemoglobin: 8.8 g/dL — ABNORMAL LOW (ref 12.0–15.0)
MCH: 22.5 pg — ABNORMAL LOW (ref 26.0–34.0)
MCHC: 30.2 g/dL (ref 30.0–36.0)
MCV: 74.4 fL — ABNORMAL LOW (ref 80.0–100.0)
Platelets: 377 10*3/uL (ref 150–400)
RBC: 3.91 MIL/uL (ref 3.87–5.11)
RDW: 18.1 % — ABNORMAL HIGH (ref 11.5–15.5)
WBC: 11.6 10*3/uL — ABNORMAL HIGH (ref 4.0–10.5)
nRBC: 0 % (ref 0.0–0.2)

## 2019-09-10 LAB — CULTURE, BLOOD (ROUTINE X 2)
Culture: NO GROWTH
Culture: NO GROWTH
Special Requests: ADEQUATE
Special Requests: ADEQUATE

## 2019-09-10 LAB — BASIC METABOLIC PANEL
Anion gap: 6 (ref 5–15)
BUN: <5 mg/dL — ABNORMAL LOW (ref 8–23)
CO2: 26 mmol/L (ref 22–32)
Calcium: 8.7 mg/dL — ABNORMAL LOW (ref 8.9–10.3)
Chloride: 108 mmol/L (ref 98–111)
Creatinine, Ser: 0.56 mg/dL (ref 0.44–1.00)
GFR calc Af Amer: >60 mL/min (ref >60)
GFR calc non Af Amer: >60 mL/min (ref >60)
Glucose, Bld: 125 mg/dL — ABNORMAL HIGH (ref 70–99)
Potassium: 4 mmol/L (ref 3.5–5.1)
Sodium: 140 mmol/L (ref 135–145)

## 2019-09-10 LAB — MAGNESIUM: Magnesium: 1.9 mg/dL (ref 1.7–2.4)

## 2019-09-10 MED ORDER — SACCHAROMYCES BOULARDII 250 MG PO CAPS
250.0000 mg | ORAL_CAPSULE | Freq: Two times a day (BID) | ORAL | Status: DC
  Administered 2019-09-10 – 2019-09-13 (×7): 250 mg via ORAL
  Filled 2019-09-10 (×7): qty 1

## 2019-09-10 MED ORDER — VANCOMYCIN HCL 125 MG PO CAPS: 125.0000 mg | ORAL_CAPSULE | Freq: Four times a day (QID) | ORAL | Status: DC

## 2019-09-10 NOTE — Progress Notes (Signed)
[facility] Progress Note     Subjective: CC-  Up in chair. Reports having 2 loose BMs this AM and she is very tired after this. Tolerating full liquids but is lactose intolerant and does not have many options. She does report some nausea this AM but thinks it was due to the creamer in her coffee. Minimal abdominal pain. No pain with PO intake. Hungry and hoping for solid food. WBC about the same 11.6, afebrile.  Objective: Vital signs in last 24 hours: Temp:  [97.6 F (36.4 C)-98.6 F (37 C)] 97.6 F (36.4 C) (07/09 0448) Pulse Rate:  [73-74] 74 (07/09 0448) Resp:  [14-18] 14 (07/09 0448) BP: (141-159)/(75-79) 146/75 (07/09 0448) SpO2:  [95 %-99 %] 95 % (07/09 0448) Last BM Date: 09/09/19  Intake/Output from previous day: 07/08 0701 - 07/09 0700 In: 1250.7 [P.O.:500; I.V.:170.1; IV Piggyback:580.6] Out: 750 [Urine:750] Intake/Output this shift: Total I/O In: 120 [P.O.:120] Out: 350 [Urine:350]  PE: Gen:  Alert, NAD, pleasant Card:  RRR Pulm:  Normal effort, clear to auscultation bilaterally Abd: Soft, non-tender, mild distention, +BS, No HSM Skin: warm and dry, no rashes  Psych: A&Ox3   Lab Results:  Recent Labs    09/09/19 0240 09/10/19 0332  WBC 11.2* 11.6*  HGB 9.2* 8.8*  HCT 30.4* 29.1*  PLT 367 377   BMET Recent Labs    09/09/19 0240 09/10/19 0332  NA 140 140  K 3.0* 4.0  CL 107 108  CO2 25 26  GLUCOSE 151* 125*  BUN <5* <5*  CREATININE 0.50 0.56  CALCIUM 8.6* 8.7*   PT/INR No results for input(s): LABPROT, INR in the last 72 hours. CMP     Component Value Date/Time   NA 140 09/10/2019 0332   K 4.0 09/10/2019 0332   CL 108 09/10/2019 0332   CO2 26 09/10/2019 0332   GLUCOSE 125 (H) 09/10/2019 0332   BUN <5 (L) 09/10/2019 0332   CREATININE 0.56 09/10/2019 0332   CALCIUM 8.7 (L) 09/10/2019 0332   PROT 5.1 (L) 09/09/2019 0240   ALBUMIN 2.5 (L) 09/09/2019 0240   AST 11 (L) 09/09/2019 0240   ALT 10 09/09/2019 0240   ALKPHOS  43 09/09/2019 0240   BILITOT 0.2 (L) 09/09/2019 0240   GFRNONAA >60 09/10/2019 0332   GFRAA >60 09/10/2019 0332   Lipase     Component Value Date/Time   LIPASE 41 09/04/2019 2332       Studies/Results: No results found.  Anti-infectives: Anti-infectives (From admission, onward)   Start     Dose/Rate Route Frequency Ordered Stop   09/05/19 1000  ceFEPIme (MAXIPIME) 2 g in sodium chloride 0.9 % 100 mL IVPB     Discontinue     2 g 200 mL/hr over 30 Minutes Intravenous Every 12 hours 09/05/19 0434     09/05/19 1000  metroNIDAZOLE (FLAGYL) IVPB 500 mg     Discontinue     500 mg 100 mL/hr over 60 Minutes Intravenous Every 8 hours 09/05/19 0435     09/05/19 0115  ceFEPIme (MAXIPIME) 2 g in sodium chloride 0.9 % 100 mL IVPB        2 g 200 mL/hr over 30 Minutes Intravenous  Once 09/05/19 0111 09/05/19 0207   09/05/19 0115  metroNIDAZOLE (FLAGYL) IVPB 500 mg        500 mg 100 mL/hr over 60 Minutes Intravenous  Once 09/05/19 0111 09/05/19 0310       Assessment/Plan PMH embolic CVA 7322  PMH  c.dif treated 6/25 GERD - start pantoprazole  HLD  Duodenal diverticulum with contained perforation - afebrile, VSS, WBC 11.6 from 11.2 - clinically improving w/ decreased abd tenderness, having flatus and BMs - OOB/mobilize  - AM labs   FEN - soft diet, IVF at 50 cc/hr ID - cefepime/flagyl  VTE - SCDs, Lovenox  Foley - none Follow up - TBD   Dispo: Advance to soft diet and add probiotic. Continue IV antibiotics. Repeat labs in AM.   LOS: 5 days    Wellington Hampshire, [facility] 09/10/2019, 10:40 AM Please see Amion for pager number during day hours 7:00am-4:30pm

## 2019-09-10 NOTE — Progress Notes (Addendum)
Spoke to patient over the phone regarding her recent treatment for c.dif. she states her diarrhea prior to admission had slowed to 1-2x daily. She states that the loose stools she is having currently while inpatient are different and less severe than when she was diagnosed with C.dif.   According to UpToDate, pts with recent hx c.dif who require systemic abx, esp those over the age of 11, should receive c.dif prophylaxis with 125 mg once daily for 5-7 days after completion of systemic antibiotics.  As of now I do not suspect patient has recurrent c.dif but she does fit the criteria for c.dif prophylaxis. We will plan to discharge her home on 7 days of PO vancomycin as above. Unfortunately our pharmacy only has the solution on formulary so I had to order the solution rather than the capsule.  Obie Dredge, PA-C

## 2019-09-11 LAB — CBC
HCT: 30.4 % — ABNORMAL LOW (ref 36.0–46.0)
Hemoglobin: 9.1 g/dL — ABNORMAL LOW (ref 12.0–15.0)
MCH: 22.1 pg — ABNORMAL LOW (ref 26.0–34.0)
MCHC: 29.9 g/dL — ABNORMAL LOW (ref 30.0–36.0)
MCV: 74 fL — ABNORMAL LOW (ref 80.0–100.0)
Platelets: 398 10*3/uL (ref 150–400)
RBC: 4.11 MIL/uL (ref 3.87–5.11)
RDW: 17.9 % — ABNORMAL HIGH (ref 11.5–15.5)
WBC: 13.7 10*3/uL — ABNORMAL HIGH (ref 4.0–10.5)
nRBC: 0 % (ref 0.0–0.2)

## 2019-09-11 LAB — BASIC METABOLIC PANEL
Anion gap: 8 (ref 5–15)
BUN: 5 mg/dL — ABNORMAL LOW (ref 8–23)
CO2: 26 mmol/L (ref 22–32)
Calcium: 8.8 mg/dL — ABNORMAL LOW (ref 8.9–10.3)
Chloride: 106 mmol/L (ref 98–111)
Creatinine, Ser: 0.56 mg/dL (ref 0.44–1.00)
GFR calc Af Amer: >60 mL/min (ref >60)
GFR calc non Af Amer: >60 mL/min (ref >60)
Glucose, Bld: 127 mg/dL — ABNORMAL HIGH (ref 70–99)
Potassium: 3.7 mmol/L (ref 3.5–5.1)
Sodium: 140 mmol/L (ref 135–145)

## 2019-09-11 LAB — MAGNESIUM: Magnesium: 1.8 mg/dL (ref 1.7–2.4)

## 2019-09-11 NOTE — Progress Notes (Signed)
Pharmacy Antibiotic Note  Erica Alvarez is a 74 y.o. female admitted on 09/04/2019 with intra-abdominal infection.  Pharmacy has been consulted for Cefepime dosing.  Day 6 cefepime/Flagyl. Per surgery, pt is clinically improving. Pt remains afebrile, WBC slight up to 13.7. Renal function stable.   Plan: Continue Cefepime 2g IV q12h Continue Flagyl per MD Per surgery, consider switching to PO abx tomorrow if WBC downtrends   Height: 5' (152.4 cm) Weight: 74.9 kg (165 lb 2 oz) IBW/kg (Calculated) : 45.5  Temp (24hrs), Avg:98.1 F (36.7 C), Min:97.9 F (36.6 C), Max:98.3 F (36.8 C)  Recent Labs  Lab 09/04/19 2332 09/05/19 0451 09/07/19 0159 09/08/19 0315 09/09/19 0240 09/10/19 0332 09/11/19 0317  WBC 27.3*   < > 13.8* 13.4* 11.2* 11.6* 13.7*  CREATININE 0.73   < > 0.63 0.55 0.50 0.56 0.56  LATICACIDVEN 1.9  --   --   --   --   --   --    < > = values in this interval not displayed.    Estimated Creatinine Clearance: 55.8 mL/min (by C-G formula based on SCr of 0.56 mg/dL).    Allergies  Allergen Reactions   Hydrocodone Itching   Penicillins Itching, Nausea And Vomiting and Other (See Comments)    Has patient had a PCN reaction causing immediate rash, facial/tongue/throat swelling, SOB or lightheadedness with hypotension: No Has patient had a PCN reaction causing severe rash involving mucus membranes or skin necrosis: No Has patient had a PCN reaction that required hospitalization: No Has patient had a PCN reaction occurring within the last 10 years: No If all of the above answers are "NO", then may proceed with Cephalosporin use.  Rash   Sulfa Antibiotics Nausea And Vomiting and Hives    itching   Medrol [Methylprednisolone] Other (See Comments)    Disorientation/ hot flashes   Sulfasalazine Nausea And Vomiting    itching   Tetracycline Hives, Itching and Nausea And Vomiting   Antimicrobials this admission:  7/4 Cefepime/Flagyl >>  Microbiology this  admission: 7/4 UCx: >100K Morganella morganii S to Bactrim, imipenem, cipro, Zosyn, 20K Klebsiella oxytoca pansensitive except R to ampicillin and cefazolin 7/4 BCx: NGTD   Berenice Bouton, PharmD PGY1 Pharmacy Resident  Please check AMION for all [place] phone numbers After 10:00 PM, call [place] [phone] 09/11/2019 10:46 AM

## 2019-09-11 NOTE — Progress Notes (Signed)
[facility] Progress Note     Subjective: CC-  Feeling better than yesterday. She reports no abdominal pain, nausea, vomiting today. Tolerating soft diet. Diarrhea improved. She had 1 soft/somewhat loose BM this morning. WBC up 13.7, afebrile  Objective: Vital signs in last 24 hours: Temp:  [97.9 F (36.6 C)-98.3 F (36.8 C)] 98.2 F (36.8 C) (07/10 0450) Pulse Rate:  [70-75] 75 (07/10 0450) Resp:  [14-18] 15 (07/10 0450) BP: (147-158)/(74-87) 150/87 (07/10 0450) SpO2:  [97 %-98 %] 98 % (07/10 0450) Last BM Date: 09/10/19  Intake/Output from previous day: 07/09 0701 - 07/10 0700 In: 3750.3 [P.O.:2130; I.V.:1120.3; IV Piggyback:500] Out: 3250 [Urine:3250] Intake/Output this shift: No intake/output data recorded.  PE: Gen: Alert, NAD, pleasant Card: RRR Pulm: Normal effort, clear to auscultation bilaterally Abd: Soft,non-tender, mild distention, +BS, No HSM Skin: warm and dry, no rashes  Psych: A&Ox3   Lab Results:  Recent Labs    09/10/19 0332 09/11/19 0317  WBC 11.6* 13.7*  HGB 8.8* 9.1*  HCT 29.1* 30.4*  PLT 377 398   BMET Recent Labs    09/10/19 0332 09/11/19 0317  NA 140 140  K 4.0 3.7  CL 108 106  CO2 26 26  GLUCOSE 125* 127*  BUN <5* 5*  CREATININE 0.56 0.56  CALCIUM 8.7* 8.8*   PT/INR No results for input(s): LABPROT, INR in the last 72 hours. CMP     Component Value Date/Time   NA 140 09/11/2019 0317   K 3.7 09/11/2019 0317   CL 106 09/11/2019 0317   CO2 26 09/11/2019 0317   GLUCOSE 127 (H) 09/11/2019 0317   BUN 5 (L) 09/11/2019 0317   CREATININE 0.56 09/11/2019 0317   CALCIUM 8.8 (L) 09/11/2019 0317   PROT 5.1 (L) 09/09/2019 0240   ALBUMIN 2.5 (L) 09/09/2019 0240   AST 11 (L) 09/09/2019 0240   ALT 10 09/09/2019 0240   ALKPHOS 43 09/09/2019 0240   BILITOT 0.2 (L) 09/09/2019 0240   GFRNONAA >60 09/11/2019 0317   GFRAA >60 09/11/2019 0317   Lipase     Component Value Date/Time   LIPASE 41 09/04/2019 2332        Studies/Results: No results found.  Anti-infectives: Anti-infectives (From admission, onward)   Start     Dose/Rate Route Frequency Ordered Stop   09/10/19 1800  vancomycin (VANCOCIN) 125 MG capsule 125 mg  Status:  Discontinued        125 mg Oral 4 times daily 09/10/19 1552 09/10/19 1552   09/10/19 1800  vancomycin (VANCOCIN) 125 MG capsule 125 mg  Status:  Discontinued        125 mg Oral 4 times daily 09/10/19 1552 09/10/19 1552   09/10/19 1800  vancomycin (VANCOCIN) 125 MG capsule 125 mg  Status:  Discontinued        125 mg Oral 4 times daily 09/10/19 1552 09/10/19 1600   09/05/19 1000  ceFEPIme (MAXIPIME) 2 g in sodium chloride 0.9 % 100 mL IVPB     Discontinue     2 g 200 mL/hr over 30 Minutes Intravenous Every 12 hours 09/05/19 0434     09/05/19 1000  metroNIDAZOLE (FLAGYL) IVPB 500 mg     Discontinue     500 mg 100 mL/hr over 60 Minutes Intravenous Every 8 hours 09/05/19 0435     09/05/19 0115  ceFEPIme (MAXIPIME) 2 g in sodium chloride 0.9 % 100 mL IVPB        2 g 200 mL/hr over 30  Minutes Intravenous  Once 09/05/19 0111 09/05/19 0207   09/05/19 0115  metroNIDAZOLE (FLAGYL) IVPB 500 mg        500 mg 100 mL/hr over 60 Minutes Intravenous  Once 09/05/19 0111 09/05/19 0310       Assessment/Plan PMH embolic CVA 0923  PMH c.dif - PO vancomycin (plan to discharge her home on 7 days of PO vancomycin) GERD - start pantoprazole  HLD  Duodenal diverticulum with contained perforation - afebrile, VSS, WBC up 13.7 from 11.6 - clinically improving w/ decreased abd tenderness, having flatus and BMs - OOB/mobilize  - AM labs   FEN -soft diet,IVF at 50 cc/hr. D/c scheduled K ID - cefepime/flagyl  VTE - SCDs, Lovenox  Foley - none Follow up - TBD   Dispo:Clinically improving but WBC up. Will continue antibiotics and repeat CBC in AM. If WBC up again will repeat CT scan. If WBC downtrending likely plan discharge home on oral antibiotics.   LOS: 6 days     [facility] 09/11/2019, 9:56 AM Please see Amion for pager number during day hours 7:00am-4:30pm

## 2019-09-12 LAB — CBC
HCT: 32.6 % — ABNORMAL LOW (ref 36.0–46.0)
Hemoglobin: 10.4 g/dL — ABNORMAL LOW (ref 12.0–15.0)
MCH: 23.6 pg — ABNORMAL LOW (ref 26.0–34.0)
MCHC: 31.9 g/dL (ref 30.0–36.0)
MCV: 73.9 fL — ABNORMAL LOW (ref 80.0–100.0)
Platelets: 449 10*3/uL — ABNORMAL HIGH (ref 150–400)
RBC: 4.41 MIL/uL (ref 3.87–5.11)
RDW: 18 % — ABNORMAL HIGH (ref 11.5–15.5)
WBC: 14.1 10*3/uL — ABNORMAL HIGH (ref 4.0–10.5)
nRBC: 0 % (ref 0.0–0.2)

## 2019-09-12 LAB — CREATININE, SERUM
Creatinine, Ser: 0.57 mg/dL (ref 0.44–1.00)
GFR calc Af Amer: >60 mL/min (ref >60)
GFR calc non Af Amer: >60 mL/min (ref >60)

## 2019-09-12 MED ORDER — VANCOMYCIN HCL 125 MG PO CAPS
125.0000 mg | ORAL_CAPSULE | Freq: Four times a day (QID) | ORAL | Status: DC
  Filled 2019-09-12 (×2): qty 1

## 2019-09-12 MED ORDER — VANCOMYCIN 50 MG/ML ORAL SOLUTION
125.0000 mg | Freq: Four times a day (QID) | ORAL | Status: DC
  Administered 2019-09-12 – 2019-09-13 (×6): 125 mg via ORAL
  Filled 2019-09-12 (×8): qty 2.5

## 2019-09-12 MED ORDER — PROCHLORPERAZINE EDISYLATE 10 MG/2ML IJ SOLN
10.0000 mg | Freq: Four times a day (QID) | INTRAMUSCULAR | Status: DC | PRN
  Administered 2019-09-12: 10 mg via INTRAVENOUS
  Filled 2019-09-12: qty 2

## 2019-09-12 NOTE — Progress Notes (Signed)
[facility] Progress Note     Subjective: CC-  Feeling worse this morning. Main complaint is nausea. She reports only mild abdominal pain in the RUQ and LLQ, not made worse by PO intake and not really worse than the previous days. No emesis. States that she has already had 3-4 partially formed BMs since 0400 this morning. WBC slightly up 14.1, afebrile.  Objective: Vital signs in last 24 hours: Temp:  [98.2 F (36.8 C)-98.6 F (37 C)] 98.2 F (36.8 C) (07/11 0618) Pulse Rate:  [68-77] 68 (07/11 0618) Resp:  [15-16] 16 (07/11 0618) BP: (144-157)/(74-80) 156/74 (07/11 0618) SpO2:  [96 %-98 %] 97 % (07/11 0618) Last BM Date: 09/10/19  Intake/Output from previous day: 07/10 0701 - 07/11 0700 In: 2018.2 [P.O.:540; I.V.:978.2; IV Piggyback:500] Out: 1500 [Urine:1500] Intake/Output this shift: Total I/O In: 220 [P.O.:220] Out: -   PE: Gen: Alert, NAD, pleasant Card: RRR Pulm: Normal effort, clear to auscultation bilaterally Abd: Soft,minimal RUQ and LLQ TTP without rebound or guarding, mild distention, +BS, No HSM Skin: warm and dry, no rashes  Psych: A&Ox3  Lab Results:  Recent Labs    09/11/19 0317 09/12/19 0630  WBC 13.7* 14.1*  HGB 9.1* 10.4*  HCT 30.4* 32.6*  PLT 398 449*   BMET Recent Labs    09/10/19 0332 09/10/19 0332 09/11/19 0317 09/12/19 0630  NA 140  --  140  --   K 4.0  --  3.7  --   CL 108  --  106  --   CO2 26  --  26  --   GLUCOSE 125*  --  127*  --   BUN <5*  --  5*  --   CREATININE 0.56   < > 0.56 0.57  CALCIUM 8.7*  --  8.8*  --    < > = values in this interval not displayed.   PT/INR No results for input(s): LABPROT, INR in the last 72 hours. CMP     Component Value Date/Time   NA 140 09/11/2019 0317   K 3.7 09/11/2019 0317   CL 106 09/11/2019 0317   CO2 26 09/11/2019 0317   GLUCOSE 127 (H) 09/11/2019 0317   BUN 5 (L) 09/11/2019 0317   CREATININE 0.57 09/12/2019 0630   CALCIUM 8.8 (L) 09/11/2019 0317   PROT  5.1 (L) 09/09/2019 0240   ALBUMIN 2.5 (L) 09/09/2019 0240   AST 11 (L) 09/09/2019 0240   ALT 10 09/09/2019 0240   ALKPHOS 43 09/09/2019 0240   BILITOT 0.2 (L) 09/09/2019 0240   GFRNONAA >60 09/12/2019 0630   GFRAA >60 09/12/2019 0630   Lipase     Component Value Date/Time   LIPASE 41 09/04/2019 2332       Studies/Results: No results found.  Anti-infectives: Anti-infectives (From admission, onward)   Start     Dose/Rate Route Frequency Ordered Stop   09/12/19 1000  vancomycin (VANCOCIN) 125 MG capsule 125 mg  Status:  Discontinued        125 mg Oral 4 times daily 09/12/19 0831 09/12/19 0847   09/12/19 1000  vancomycin (VANCOCIN) 50 mg/mL oral solution 125 mg     Discontinue     125 mg Oral 4 times daily 09/12/19 0847 09/22/19 0959   09/10/19 1800  vancomycin (VANCOCIN) 125 MG capsule 125 mg  Status:  Discontinued        125 mg Oral 4 times daily 09/10/19 1552 09/10/19 1552   09/10/19 1800  vancomycin (VANCOCIN) 125  MG capsule 125 mg  Status:  Discontinued        125 mg Oral 4 times daily 09/10/19 1552 09/10/19 1552   09/10/19 1800  vancomycin (VANCOCIN) 125 MG capsule 125 mg  Status:  Discontinued        125 mg Oral 4 times daily 09/10/19 1552 09/10/19 1600   09/05/19 1000  ceFEPIme (MAXIPIME) 2 g in sodium chloride 0.9 % 100 mL IVPB     Discontinue     2 g 200 mL/hr over 30 Minutes Intravenous Every 12 hours 09/05/19 0434     09/05/19 1000  metroNIDAZOLE (FLAGYL) IVPB 500 mg     Discontinue     500 mg 100 mL/hr over 60 Minutes Intravenous Every 8 hours 09/05/19 0435     09/05/19 0115  ceFEPIme (MAXIPIME) 2 g in sodium chloride 0.9 % 100 mL IVPB        2 g 200 mL/hr over 30 Minutes Intravenous  Once 09/05/19 0111 09/05/19 0207   09/05/19 0115  metroNIDAZOLE (FLAGYL) IVPB 500 mg        500 mg 100 mL/hr over 60 Minutes Intravenous  Once 09/05/19 0111 09/05/19 0310       Assessment/Plan PMH embolic CVA 0300  PMH c.dif - PO vancomycin (plan to discharge her home on 7  days of PO vancomycin) GERD - pantoprazole  HLD  Duodenal diverticulum with contained perforation - afebrile, VSS, WBC up 14.1 from 13.7 - OOB/mobilize  - AM labs   FEN -soft diet,IVF at 50 cc/hr ID - cefepime/flagyl 7/4>>day#8 VTE - SCDs, Lovenox  Foley - none Follow up - TBD   Dispo:WBC minimally up from yesterday. Patient clinically not having much abdominal pain but she is more nauseated. She is also having more loose/semiformed stool. Her PO vancomycin was ordered but discontinued by her RN for unknown reasons so she has not received it. Nausea and leukocytosis may be from C diff rather than worsening duodenal diverticulum perforation. Since she is afebrile, VSS, and she does not have worsening abdominal pain I will wait on repeat CT scan. I have reordered PO vanco. Continue IV antibiotics. Will monitor and repeat CBC in AM.    LOS: 7 days    Wellington Hampshire, [facility] 09/12/2019, 9:09 AM Please see Amion for pager number during day hours 7:00am-4:30pm

## 2019-09-12 NOTE — Plan of Care (Signed)

## 2019-09-13 ENCOUNTER — Other Ambulatory Visit (HOSPITAL_COMMUNITY): Payer: Medicare Other

## 2019-09-13 ENCOUNTER — Telehealth: Payer: Self-pay | Admitting: Gastroenterology

## 2019-09-13 ENCOUNTER — Inpatient Hospital Stay (HOSPITAL_COMMUNITY): Payer: Medicare Other

## 2019-09-13 ENCOUNTER — Encounter (HOSPITAL_COMMUNITY): Payer: Self-pay

## 2019-09-13 LAB — CBC
HCT: 30.2 % — ABNORMAL LOW (ref 36.0–46.0)
Hemoglobin: 9.3 g/dL — ABNORMAL LOW (ref 12.0–15.0)
MCH: 22.8 pg — ABNORMAL LOW (ref 26.0–34.0)
MCHC: 30.8 g/dL (ref 30.0–36.0)
MCV: 74 fL — ABNORMAL LOW (ref 80.0–100.0)
Platelets: 413 10*3/uL — ABNORMAL HIGH (ref 150–400)
RBC: 4.08 MIL/uL (ref 3.87–5.11)
RDW: 17.7 % — ABNORMAL HIGH (ref 11.5–15.5)
WBC: 15.1 10*3/uL — ABNORMAL HIGH (ref 4.0–10.5)
nRBC: 0 % (ref 0.0–0.2)

## 2019-09-13 MED ORDER — CEFDINIR 300 MG PO CAPS
300.0000 mg | ORAL_CAPSULE | Freq: Two times a day (BID) | ORAL | 0 refills | Status: AC
Start: 2019-09-13 — End: 2019-09-18

## 2019-09-13 MED ORDER — IOHEXOL 300 MG/ML  SOLN
100.0000 mL | Freq: Once | INTRAMUSCULAR | Status: AC | PRN
  Administered 2019-09-13: 100 mL via INTRAVENOUS

## 2019-09-13 MED ORDER — METRONIDAZOLE 500 MG PO TABS
500.0000 mg | ORAL_TABLET | Freq: Three times a day (TID) | ORAL | 0 refills | Status: AC
Start: 2019-09-13 — End: 2019-09-18

## 2019-09-13 MED ORDER — IOHEXOL 9 MG/ML PO SOLN: 500.0000 mL | ORAL | Status: AC

## 2019-09-13 MED ORDER — VANCOMYCIN HCL 125 MG PO CAPS: 125.0000 mg | ORAL_CAPSULE | Freq: Four times a day (QID) | ORAL | 0 refills | Status: DC

## 2019-09-13 MED ORDER — ONDANSETRON 4 MG PO TBDP: 4.0000 mg | ORAL_TABLET | Freq: Four times a day (QID) | ORAL | 0 refills | Status: AC | PRN

## 2019-09-13 MED ORDER — ONDANSETRON HCL 4 MG PO TABS: 4.0000 mg | ORAL_TABLET | Freq: Four times a day (QID) | ORAL | 0 refills | Status: AC | PRN

## 2019-09-13 MED ORDER — ACETAMINOPHEN 325 MG PO TABS: 650.0000 mg | ORAL_TABLET | Freq: Four times a day (QID) | ORAL | Status: AC | PRN

## 2019-09-13 NOTE — Progress Notes (Signed)
Erica Alvarez to be D/C'd  per MD order. Discussed with the patient and all questions fully answered.  VSS, Skin clean, dry and intact without evidence of skin break down, no evidence of skin tears noted.  IV catheter discontinued intact. Site without signs and symptoms of complications. Dressing and pressure applied.  An After Visit Summary was printed and given to the patient. Patient received prescription.  D/c education completed with patient/family including follow up instructions, medication list, d/c activities limitations if indicated, with other d/c instructions as indicated by MD - patient able to verbalize understanding, all questions fully answered.   Patient instructed to return to ED, call 911, or call MD for any changes in condition.   Patient to be escorted via [place], and D/C home via private auto.

## 2019-09-13 NOTE — Plan of Care (Signed)

## 2019-09-13 NOTE — Discharge Summary (Signed)
[facility] Discharge Summary   Patient ID: Erica Alvarez MRN: [id number] DOB/AGE: January 07, 1946 74 y.o.  Admit date: 09/04/2019 Discharge date: 09/13/2019  Admitting Diagnosis: Duodenal diverticulum with contained perforation  C. Diff colitis   Discharge Diagnosis Duodenal diverticulum with contained perforation  C. Diff colitis  Consultants None   Imaging: CT ABDOMEN PELVIS W CONTRAST  Result Date: 09/13/2019 CLINICAL DATA:  Nausea. EXAM: CT ABDOMEN AND PELVIS WITH CONTRAST TECHNIQUE: Multidetector CT imaging of the abdomen and pelvis was performed using the standard protocol following bolus administration of intravenous contrast. CONTRAST:  145mL OMNIPAQUE IOHEXOL 300 MG/ML  SOLN COMPARISON:  September 05, 2019.  August 24, 2015. FINDINGS: Lower chest: No acute abnormality. Hepatobiliary: No focal liver abnormality is seen. Status post cholecystectomy. No biliary dilatation. Pancreas: Unremarkable. No pancreatic ductal dilatation or surrounding inflammatory changes. Spleen: Normal in size without focal abnormality. Adrenals/Urinary Tract: Adrenal glands are unremarkable. Kidneys are normal, without renal calculi, focal lesion, or hydronephrosis. Bladder is unremarkable. Stomach/Bowel: The stomach is unremarkable. Free air inflammatory changes noted around the proximal duodenum on prior exam appear to be nearly resolved currently. Air-fluid collection is seen adjacent to third portion of duodenum which was present on prior exam of 2017 and most likely represents duodenal diverticulum. There is no evidence of bowel obstruction. Status post appendectomy. Vascular/Lymphatic: No significant vascular findings are present. No enlarged abdominal or pelvic lymph nodes. Reproductive: Status post hysterectomy. No adnexal masses. Other: No abdominal wall hernia or abnormality. No abdominopelvic ascites. Musculoskeletal: No acute or significant osseous findings. IMPRESSION: 1. Free air and inflammatory  changes noted around the proximal duodenum on prior exam appear to be nearly resolved currently. Air-fluid collection is seen adjacent to third portion of duodenum which was present on prior exam of 2017 and most likely represents duodenal diverticulum. 2. No other abnormality seen in the abdomen or pelvis. Electronically Signed   By: Marijo Conception M.D.   On: 09/13/2019 13:42    Procedures None  Hospital Course:  Patient is a 74 year old female who presented to [facility] with abdominal pain.  Workup showed duodenal diverticulum with contained perforation.  Patient was admitted for IV antibiotics and pain control. She was already being treated for C. Diff colitis with oral vancomycin prior to admission and this was continued while hospitalized. Patient slowly advanced diet as tolerated with improvement in labs and clinical symptoms. Patient with slight increase in leukocytosis 7/12 and repeat CT obtained. This showed near resolution in presenting problem and patient clinically was doing well enough for discharge home.   On 09/13/19, the patient was voiding well, tolerating diet, ambulating well, pain well controlled, vital signs stable and felt stable for discharge home.  Patient will follow up with GI and PCP.    Allergies as of 09/13/2019      Reactions   Hydrocodone Itching   Penicillins Itching, Nausea And Vomiting, Other (See Comments)   Has patient had a PCN reaction causing immediate rash, facial/tongue/throat swelling, SOB or lightheadedness with hypotension: No Has patient had a PCN reaction causing severe rash involving mucus membranes or skin necrosis: No Has patient had a PCN reaction that required hospitalization: No Has patient had a PCN reaction occurring within the last 10 years: No If all of the above answers are "NO", then may proceed with Cephalosporin use. Rash   Sulfa Antibiotics Nausea And Vomiting, Hives   itching   Medrol [methylprednisolone] Other (See Comments)    Disorientation/ hot flashes   Sulfasalazine  Nausea And Vomiting   itching   Tetracycline Hives, Itching, Nausea And Vomiting      Medication List    TAKE these medications   acetaminophen 325 MG tablet Commonly known as: TYLENOL Take 2 tablets (650 mg total) by mouth every 6 (six) hours as needed for mild pain (or temp > 100).   aspirin 81 MG tablet Take 81 mg by mouth daily.   cefdinir 300 MG capsule Commonly known as: OMNICEF Take 1 capsule (300 mg total) by mouth 2 (two) times daily for 5 days.   cetirizine 10 MG tablet Commonly known as: ZYRTEC Take 10 mg by mouth daily.   clobetasol ointment 0.05 % Commonly known as: TEMOVATE Apply 1 application topically 2 (two) times daily as needed (dermatitis).   diazepam 5 MG tablet Commonly known as: VALIUM Take 5 mg by mouth daily as needed for anxiety.   EQL Omega 3 Fish Oil 1400 MG Caps Take 1,400 mg by mouth daily.   fenofibrate micronized 67 MG capsule Commonly known as: LOFIBRA Take 67 mg by mouth daily.   Florastor 250 MG capsule Generic drug: saccharomyces boulardii Take 250 mg by mouth daily.   fluconazole 150 MG tablet Commonly known as: DIFLUCAN Take 150 mg by mouth as needed.   fluticasone 50 MCG/ACT nasal spray Commonly known as: FLONASE Place 2 sprays into both nostrils daily.   glycopyrrolate 1 MG tablet Commonly known as: ROBINUL Take 2 tablets by mouth twice daily.   metroNIDAZOLE 500 MG tablet Commonly known as: Flagyl Take 1 tablet (500 mg total) by mouth 3 (three) times daily for 5 days.   multivitamin tablet Take 1 tablet by mouth daily.   omeprazole 40 MG capsule Commonly known as: PRILOSEC Take 40 mg by mouth 2 (two) times daily.   ondansetron 4 MG disintegrating tablet Commonly known as: ZOFRAN-ODT Take 1 tablet (4 mg total) by mouth every 6 (six) hours as needed for nausea.   ondansetron 4 MG tablet Commonly known as: ZOFRAN Take 1 tablet (4 mg total) by mouth every 6 (six)  hours as needed for nausea or vomiting.   propranolol 10 MG tablet Commonly known as: INDERAL Take 10 mg by mouth 2 (two) times daily.   Systane Balance 0.6 % Soln Generic drug: Propylene Glycol Place 1 drop into both eyes daily as needed (dry eyes).   vancomycin 125 MG capsule Commonly known as: VANCOCIN Take 1 capsule (125 mg total) by mouth 4 (four) times daily. Finish remaining pills from previous Rx and these. What changed: additional instructions         Follow-up Information    [facility]. Call.   Specialty: Gastroenterology Contact information: [place] [id number] [facility], [place]. Call.   Specialty: General Surgery Why: as needed, you do not have to schedule an appointment Contact information: [address] [place] [phone]              Signed: Norm Parcel , [facility] 09/14/2019, 2:05 PM Please see Amion for pager number during day hours 7:00am-4:30pm

## 2019-09-13 NOTE — Discharge Instructions (Addendum)
Clostridioides Difficile Infection Clostridioides difficile, or C. diff, infection is caused by germs (bacteria). It causes irritation and swelling of the colon (colitis). This infection can spread from person to person (is contagious). You may also get C. diff from food or water, or from touching surfaces that have the germs on them. What are the causes? Certain germs live in the colon and help to digest food. This infection starts when the balance of helpful germs in the colon changes, and the C. diff germs grow out of control. This is caused by taking antibiotics. What increases the risk? Your risk is higher if you:  Take certain antibiotics that kill many types of germs.  Take antibiotics for a long time.  Stay for a long time in a health care setting, such as: ? A hospital. ? A long-term care facility.  Are older than age 65. Your risk is somewhat higher if you:  Have had C. diff infection before or had contact with C. diff germs.  Have a weak body defense system (immune system).  Take a medicine for a long time that reduces stomach acid. This includes proton pump inhibitors.  Have serious health problems, such as: ? Colon cancer. ? Inflammatory bowel disease (IBD).  Have had a procedure or surgery on your gastrointestinal (GI) tract. Some people develop C. diff even though they are not clearly at risk. What are the signs or symptoms?  Watery poop (diarrhea).  Fever.  Tiredness (fatigue).  Loss of appetite.  Nausea.  Swelling, pain, cramping, or tenderness in your belly (abdomen). How is this treated?  Stopping the antibiotics that you were taking when the C. diff infection began. Do this only as told by your doctor.  Taking certain antibiotics to stop C. diff from growing.  Taking donor poop (stool) from a healthy person and placing it into the colon. This may be done if the infection keeps coming back.  Having surgery to remove the infected part of the  colon. This is rare. Follow these instructions at home: Medicines  Take over-the-counter and prescription medicines only as told by your doctor.  Take your antibiotic medicine as told by your doctor. Do not stop taking the antibiotic even if you start to feel better.  Do not treat watery poop with medicines unless your doctor tells you to. Eating and drinking   Follow instructions from your doctor about eating or drinking.  Eat bland foods in small amounts as you are able. These foods include: ? Bananas. ? Applesauce. ? Rice. ? Low-fat (lean) meats. ? Toast. ? Crackers.  Follow your doctor's instructions on how to get enough fluids into your body. You may need to: ? Drink clear fluids. This includes water, fruit juice you have added water to, or low-calorie sports drinks. ? Suck on ice chips. ? Take an ORS (oral rehydration solution).  Avoid milk, caffeine, and alcohol.  Drink enough fluid to keep your pee (urine) pale yellow. Activity  Rest as told by your doctor.  Return to your normal activities as told by your doctor. Ask your doctor what activities are safe for you. General instructions  Wash your hands often with soap and water. Do this for at least 20 seconds. Bathe using soap and water daily.  Be sure your home is clean before you leave the hospital or clinic to go home.  Continue daily cleaning for at least a week after going home.  Keep all follow-up visits as told by your doctor. This is important.   How is this prevented? Hand hygiene   Wash your hands well before you cook and after you use the bathroom. Use soap and water for at least 20 seconds. Make sure that people who live with you also wash their hands often.  If you are being treated at a hospital or clinic, make sure that: ? All doctors and nurses wash their hands with soap and water before touching you. ? All visitors wash their hands with soap and water before touching you. Contact  precautions  If you get watery poop while you are in the hospital or a long-term care facility, let your doctor know right away.  When you visit someone in the hospital or a long-term care facility, follow the rules for wearing a gown, gloves, or other protective equipment.  If possible, avoid contact with people who have watery poop.  If you are sick and live with other people, use a separate bathroom, if you can. Clean environment  Clean surfaces that are touched often every day. Use a product that has chlorine bleach in it. The bleach should be 10% solution. Be sure to: ? Read the instructions to find out if the product you are using will work on what you are cleaning. ? Clean toilets, bathtubs, sinks, door knobs, and work surfaces.  If you are in the hospital, make sure that the staff cleans the surfaces in your room each day. Tell someone right away if body fluids have splashed or spilled. Washing clothes and linens  Use laundry soap that has chlorine bleach in it to wash clothes and linens. Be sure to: ? Use powder soap instead of liquid. ? Run your washing machine on the hot setting with nothing but soap in it. Do this once a month. Contact a doctor if:  Your symptoms do not get better or they get worse.  Your symptoms go away and then come back.  You have a fever.  You have new symptoms. Get help right away if:  You have more pain or tenderness in your belly.  Your poop is mostly bloody.  Your poop looks dark black and tarry.  You cannot eat or drink without vomiting.  You have signs of not having enough fluids in your body. These include: ? Dark pee, very little pee, or no pee. ? Cracked lips or dry mouth. ? No tears when you cry. ? Sunken eyes. ? Feeling sleepy. ? Feeling weak or dizzy. Summary  C. diff infection may happen after taking antibiotic medicines.  Symptoms include watery poop, fever, tiredness, loss of appetite, nausea, and belly  problems.  Treatment starts with stopping the antibiotics you were using when the infection began. Certain antibiotics are then used to stop C. diff from growing.  This infection is sometimes treated by placing donor poop into the colon or doing surgery.  Washing hands and cleaning every day can help keep C. diff from spreading. This information is not intended to replace advice given to you by your health care provider. Make sure you discuss any questions you have with your health care provider. Document Revised: 07/15/2018 Document Reviewed: 07/15/2018 Elsevier Patient Education  [place] A soft-food eating plan includes foods that are safe and easy to chew and swallow. Your health care provider or dietitian can help you find foods and flavors that fit into this plan. Follow this plan until your health care provider or dietitian says it is safe to start eating other foods  and food textures. What are tips for following this plan? General guidelines   Take small bites of food, or cut food into pieces about  inch or smaller. Bite-sized pieces of food are easier to chew and swallow.  Eat moist foods. Avoid overly dry foods.  Avoid foods that: ? Are difficult to swallow, such as dry, chunky, crispy, or sticky foods. ? Are difficult to chew, such as hard, tough, or stringy foods. ? Contain nuts, seeds, or fruits.  Follow instructions from your dietitian about the types of liquids that are safe for you to swallow. You may be allowed to have: ? Thick liquids only. This includes only liquids that are thicker than honey. ? Thin and thick liquids. This includes all beverages and foods that become liquid at room temperature.  To make thick liquids: ? Purchase a commercial liquid thickening powder. These are available at grocery stores and pharmacies. ? Mix the thickener into liquids according to instructions on the label. ? Purchase ready-made thickened  liquids. ? Thicken soup by pureeing, straining to remove chunks, and adding flour, potato flakes, or corn starch. ? Add commercial thickener to foods that become liquid at room temperature, such as milk shakes, yogurt, ice cream, gelatin, and sherbet.  Ask your health care provider whether you need to take a fiber supplement. Cooking  Cook meats so they stay tender and moist. Use methods like braising, stewing, or baking in liquid.  Cook vegetables and fruit until they are soft enough to be mashed with a fork.  Peel soft, fresh fruits such as peaches, nectarines, and melons.  When making soup, make sure chunks of meat and vegetables are smaller than  inch.  Reheat leftover foods slowly so that a tough crust does not form. What foods are allowed? The items listed below may not be a complete list. Talk with your dietitian about what dietary choices are best for you. Grains Breads, muffins, pancakes, or waffles moistened with syrup, jelly, or butter. Dry cereals well-moistened with milk. Moist, cooked cereals. Well-cooked pasta and rice. Vegetables All soft-cooked vegetables. Shredded lettuce. Fruits All canned and cooked fruits. Soft, peeled fresh fruits. Strawberries. Dairy Milk. Cream. Yogurt. Cottage cheese. Soft cheese without the rind. Meats and other protein foods Tender, moist ground meat, poultry, or fish. Meat cooked in gravy or sauces. Eggs. Sweets and desserts Ice cream. Milk shakes. Sherbet. Pudding. Fats and oils Butter. Margarine. Olive, canola, sunflower, and grapeseed oil. Smooth salad dressing. Smooth cream cheese. Mayonnaise. Gravy. What foods are not allowed? The items listed bemay not be a complete list. Talk with your dietitian about what dietary choices are best for you. Grains Coarse or dry cereals, such as bran, granola, and shredded wheat. Tough or chewy crusty breads, such as [place] bread or baguettes. Breads with nuts, seeds, or fruit. Vegetables All raw  vegetables. Cooked corn. Cooked vegetables that are tough or stringy. Tough, crisp, fried potatoes and potato skins. Fruits Fresh fruits with skins or seeds, or both, such as apples, pears, and grapes. Stringy, high-pulp fruits, such as papaya, pineapple, coconut, and mango. Fruit leather and all dried fruit. Dairy Yogurt with nuts or coconut. Meats and other protein foods Hard, dry sausages. Dry meat, poultry, or fish. Meats with gristle. Fish with bones. Fried meat or fish. Lunch meat and hotdogs. Nuts and seeds. Chunky peanut butter or other nut butters. Sweets and desserts Cakes or cookies that are very dry or chewy. Desserts with dried fruit, nuts, or coconut. Fried pastries. Very rich  pastries. Fats and oils Cream cheese with fruit or nuts. Salad dressings with seeds or chunks. Summary  A soft-food eating plan includes foods that are safe and easy to swallow. Generally, the foods should be soft enough to be mashed with a fork.  Avoid foods that are dry, hard to chew, crunchy, sticky, stringy, or crispy.  Ask your health care provider whether you need to thicken your liquids and if you need to take a fiber supplement. This information is not intended to replace advice given to you by your health care provider. Make sure you discuss any questions you have with your health care provider. Document Revised: 06/11/2018 Document Reviewed: 04/23/2016 Elsevier Patient Education  [place].

## 2019-09-13 NOTE — Progress Notes (Signed)
[facility] Progress Note     Subjective: CC-  Feeling much better today. Nausea improved. States that she only took zofran once over the last 24 hours. Tolerating diet. Diarrhea is much less. She had one semi-formed BM this AM.  WBC up 15.1, afebrile  Objective: Vital signs in last 24 hours: Temp:  [98 F (36.7 C)-98.3 F (36.8 C)] 98.3 F (36.8 C) (07/12 0531) Pulse Rate:  [74-75] 75 (07/12 0531) Resp:  [16-17] 17 (07/12 0531) BP: (136-167)/(68-89) 156/89 (07/12 0531) SpO2:  [97 %-98 %] 98 % (07/12 0531) Last BM Date: 09/11/19  Intake/Output from previous day: 07/11 0701 - 07/12 0700 In: 1380.9 [P.O.:660; I.V.:420.9; IV Piggyback:300] Out: 2200 [Urine:2200] Intake/Output this shift: No intake/output data recorded.  PE: Gen: Alert, NAD, pleasant Card: RRR Pulm: Normal effort, clear to auscultation bilaterally Abd: Soft,nontender, mild distention, +BS, No HSM Skin: warm and dry, no rashes  Psych: A&Ox3  Lab Results:  Recent Labs    09/12/19 0630 09/13/19 0132  WBC 14.1* 15.1*  HGB 10.4* 9.3*  HCT 32.6* 30.2*  PLT 449* 413*   BMET Recent Labs    09/11/19 0317 09/12/19 0630  NA 140  --   K 3.7  --   CL 106  --   CO2 26  --   GLUCOSE 127*  --   BUN 5*  --   CREATININE 0.56 0.57  CALCIUM 8.8*  --    PT/INR No results for input(s): LABPROT, INR in the last 72 hours. CMP     Component Value Date/Time   NA 140 09/11/2019 0317   K 3.7 09/11/2019 0317   CL 106 09/11/2019 0317   CO2 26 09/11/2019 0317   GLUCOSE 127 (H) 09/11/2019 0317   BUN 5 (L) 09/11/2019 0317   CREATININE 0.57 09/12/2019 0630   CALCIUM 8.8 (L) 09/11/2019 0317   PROT 5.1 (L) 09/09/2019 0240   ALBUMIN 2.5 (L) 09/09/2019 0240   AST 11 (L) 09/09/2019 0240   ALT 10 09/09/2019 0240   ALKPHOS 43 09/09/2019 0240   BILITOT 0.2 (L) 09/09/2019 0240   GFRNONAA >60 09/12/2019 0630   GFRAA >60 09/12/2019 0630   Lipase     Component Value Date/Time   LIPASE 41 09/04/2019  2332       Studies/Results: No results found.  Anti-infectives: Anti-infectives (From admission, onward)   Start     Dose/Rate Route Frequency Ordered Stop   09/12/19 1000  vancomycin (VANCOCIN) 125 MG capsule 125 mg  Status:  Discontinued        125 mg Oral 4 times daily 09/12/19 0831 09/12/19 0847   09/12/19 1000  vancomycin (VANCOCIN) 50 mg/mL oral solution 125 mg     Discontinue     125 mg Oral 4 times daily 09/12/19 0847 09/22/19 0959   09/10/19 1800  vancomycin (VANCOCIN) 125 MG capsule 125 mg  Status:  Discontinued        125 mg Oral 4 times daily 09/10/19 1552 09/10/19 1552   09/10/19 1800  vancomycin (VANCOCIN) 125 MG capsule 125 mg  Status:  Discontinued        125 mg Oral 4 times daily 09/10/19 1552 09/10/19 1552   09/10/19 1800  vancomycin (VANCOCIN) 125 MG capsule 125 mg  Status:  Discontinued        125 mg Oral 4 times daily 09/10/19 1552 09/10/19 1600   09/05/19 1000  ceFEPIme (MAXIPIME) 2 g in sodium chloride 0.9 % 100 mL IVPB  Discontinue     2 g 200 mL/hr over 30 Minutes Intravenous Every 12 hours 09/05/19 0434     09/05/19 1000  metroNIDAZOLE (FLAGYL) IVPB 500 mg     Discontinue     500 mg 100 mL/hr over 60 Minutes Intravenous Every 8 hours 09/05/19 0435     09/05/19 0115  ceFEPIme (MAXIPIME) 2 g in sodium chloride 0.9 % 100 mL IVPB        2 g 200 mL/hr over 30 Minutes Intravenous  Once 09/05/19 0111 09/05/19 0207   09/05/19 0115  metroNIDAZOLE (FLAGYL) IVPB 500 mg        500 mg 100 mL/hr over 60 Minutes Intravenous  Once 09/05/19 0111 09/05/19 0310       Assessment/Plan PMH embolic CVA 1314  PMH c.dif- PO vancomycin (plan to discharge her home on 7 days of PO vancomycin) GERD - pantoprazole  HLD  Duodenal diverticulum with contained perforation - afebrile, VSS, WBCup 15.14 from 14.1 - OOB/mobilize   FEN -soft diet,IVF at 50 cc/hr ID - cefepime/flagyl 7/4>>day#9, po vancomycin 7/11>> VTE - SCDs, Lovenox  Foley - none Follow up - TBD    Dispo:WBC continues to rise. Repeat CT scan with PO contrast today.    LOS: 8 days    [facility] 09/13/2019, 8:10 AM Please see Amion for pager number during day hours 7:00am-4:30pm

## 2019-09-14 NOTE — Telephone Encounter (Signed)
Patient will follow up [place] PA on 8/3 1:30.

## 2019-09-14 NOTE — Telephone Encounter (Signed)
Dr. Fuller Plan, patient d/c from hospital for diverticulitis with perforation and abscess.  What time frame does she need follow up with [facility]?

## 2019-09-14 NOTE — Telephone Encounter (Signed)
She was cared for yb [facility] during her hospital stay so should have follow up with [facility] as they direct. Can follow up with [place] within a month.

## 2019-09-16 DIAGNOSIS — K5710 Diverticulosis of small intestine without perforation or abscess without bleeding: Secondary | ICD-10-CM | POA: Diagnosis not present

## 2019-09-16 DIAGNOSIS — A0472 Enterocolitis due to Clostridium difficile, not specified as recurrent: Secondary | ICD-10-CM | POA: Diagnosis not present

## 2019-09-16 DIAGNOSIS — D649 Anemia, unspecified: Secondary | ICD-10-CM | POA: Diagnosis not present

## 2019-09-20 NOTE — Telephone Encounter (Signed)
Patient called in reference to previous message advised her of the recommendations to follow up with [facility] and remain with the appt with [facility]. Patient disagreed and is requesting a call back.

## 2019-09-20 NOTE — Telephone Encounter (Signed)
Lm on vm for patient to return call 

## 2019-09-20 NOTE — Telephone Encounter (Signed)
Spoke with patient, patient stated that she had spoken with [facility] and they stated that she did not need an appt with them but they suggested that she should be seen sooner than 10/05/19. Pt scheduled for 09/27/19 at 1:50 pm with Dr. Fuller Plan.

## 2019-09-27 ENCOUNTER — Ambulatory Visit (INDEPENDENT_AMBULATORY_CARE_PROVIDER_SITE_OTHER): Payer: Medicare Other | Admitting: Gastroenterology

## 2019-09-27 ENCOUNTER — Encounter: Payer: Self-pay | Admitting: Gastroenterology

## 2019-09-27 VITALS — BP 134/80 | HR 84 | Ht 59.65 in | Wt 160.0 lb

## 2019-09-27 DIAGNOSIS — K5712 Diverticulitis of small intestine without perforation or abscess without bleeding: Secondary | ICD-10-CM | POA: Diagnosis not present

## 2019-09-27 DIAGNOSIS — A0472 Enterocolitis due to Clostridium difficile, not specified as recurrent: Secondary | ICD-10-CM

## 2019-09-27 NOTE — Progress Notes (Signed)
    History of Present Illness: This is a 74 year old female returning following hospitalization for duodenal diverticulitis and C. difficile diarrhea.  Her abdominal pain has completely resolved.  She has completed a course of vancomycin.  She notes her stools are more formed and trending toward normal but not normal yet.  She has intermittent mild nausea.  She remains on a soft, limited diet.  Current Medications, Allergies, Past Medical History, Past Surgical History, Family History and Social History were reviewed in [employer] record.   Physical Exam: General: Well developed, well nourished, no acute distress Head: Normocephalic and atraumatic Eyes:  sclerae anicteric, EOMI Ears: Normal auditory acuity Mouth: Not examined, mask on during Covid-19 pandemic Lungs: Clear throughout to auscultation Heart: Regular rate and rhythm; no murmurs, rubs or bruits Abdomen: Soft, non tender and non distended. No masses, hepatosplenomegaly or hernias noted. Normal Bowel sounds Rectal: Not done Musculoskeletal: Symmetrical with no gross deformities  Pulses:  Normal pulses noted Extremities: No clubbing, cyanosis, edema or deformities noted Neurological: Alert oriented x 4, grossly nonfocal Psychological:  Alert and cooperative. Normal mood and affect   Assessment and Recommendations:  1. C diff.  Completed course of vancomycin.  Zofran q6h prn. Florastor bid for 6 more weeks and then resume Electronics engineer daily.  Advance diet gradually as tolerated with high fat foods, raw fruits and raw vegetables as the last dietary addition.  2. Duodenal diverticulitis, resolved.   3. IBS-D.  Align daily as above.  Glycopyrrolate 1 mg p.o. twice daily as needed. REV in 1 year.   4. GERD, history of esophageal stricture. Omperazole 40 mg po qd. follow antireflux measures. REV in 1 year.

## 2019-09-27 NOTE — Patient Instructions (Signed)
Continue Florastor twice daily x 6 weeks then resume Align daily.   Advance your soft diet gradually to regular diet.   Thank you for choosing me and [facility].  Pricilla Riffle. Dagoberto Ligas., MD., [facility] Reg[facility]

## 2019-09-28 DIAGNOSIS — R7989 Other specified abnormal findings of blood chemistry: Secondary | ICD-10-CM | POA: Diagnosis not present

## 2019-10-05 ENCOUNTER — Ambulatory Visit: Payer: Medicare Other | Admitting: Physician Assistant

## 2019-10-08 ENCOUNTER — Telehealth: Payer: Self-pay | Admitting: Family

## 2019-10-08 ENCOUNTER — Other Ambulatory Visit: Payer: Self-pay | Admitting: Family

## 2019-10-08 NOTE — Telephone Encounter (Signed)
I called and spoke with patinet regarding appointments.  She was aware of our location since she was last seen in 2016.  She did not wish me to mail a new Patient Packet .

## 2019-10-12 ENCOUNTER — Other Ambulatory Visit: Payer: Self-pay

## 2019-10-12 ENCOUNTER — Encounter: Payer: Self-pay | Admitting: Podiatry

## 2019-10-12 ENCOUNTER — Ambulatory Visit (INDEPENDENT_AMBULATORY_CARE_PROVIDER_SITE_OTHER): Payer: Medicare Other | Admitting: Podiatry

## 2019-10-12 DIAGNOSIS — M778 Other enthesopathies, not elsewhere classified: Secondary | ICD-10-CM

## 2019-10-12 DIAGNOSIS — M19072 Primary osteoarthritis, left ankle and foot: Secondary | ICD-10-CM | POA: Diagnosis not present

## 2019-10-12 DIAGNOSIS — S93401D Sprain of unspecified ligament of right ankle, subsequent encounter: Secondary | ICD-10-CM

## 2019-10-13 NOTE — Progress Notes (Signed)
She presents today for follow-up of arthritis dorsal aspect of the left foot.  She states that her right foot is doing much better but the second toe is tender sometimes  Objective: Vital signs are stable alert and oriented x3.  She has pain on palpation of the second metatarsophalangeal joint of the right foot she also has pain on palpation dorsal aspect of the left foot with palpable nodular masses.  This is consistent with osteoarthritis dorsal aspect left foot.  Assessment: Capsulitis second metatarsophalangeal joint of the right foot left foot demonstrates chronic osteoarthritis and capsulitis.  Plan: I injected bilateral feet today dexamethasone from the plantar aspect of the right second metatarsophalangeal joint 2 mg was utilized.  Contralateral foot dorsal aspect of Lisfranc's joints area 10 mg of Kenalog follow-up with her on an as-needed basis.

## 2019-10-14 ENCOUNTER — Other Ambulatory Visit: Payer: Self-pay

## 2019-10-14 DIAGNOSIS — L570 Actinic keratosis: Secondary | ICD-10-CM | POA: Diagnosis not present

## 2019-10-14 DIAGNOSIS — X32XXXD Exposure to sunlight, subsequent encounter: Secondary | ICD-10-CM | POA: Diagnosis not present

## 2019-10-14 DIAGNOSIS — L821 Other seborrheic keratosis: Secondary | ICD-10-CM | POA: Diagnosis not present

## 2019-10-14 DIAGNOSIS — L820 Inflamed seborrheic keratosis: Secondary | ICD-10-CM | POA: Diagnosis not present

## 2019-11-04 ENCOUNTER — Other Ambulatory Visit: Payer: Self-pay | Admitting: Family

## 2019-11-04 DIAGNOSIS — D649 Anemia, unspecified: Secondary | ICD-10-CM

## 2019-11-05 ENCOUNTER — Inpatient Hospital Stay (HOSPITAL_BASED_OUTPATIENT_CLINIC_OR_DEPARTMENT_OTHER): Payer: Medicare Other | Admitting: Family

## 2019-11-05 ENCOUNTER — Other Ambulatory Visit: Payer: Self-pay

## 2019-11-05 ENCOUNTER — Inpatient Hospital Stay: Payer: Medicare Other | Attending: Hematology & Oncology

## 2019-11-05 ENCOUNTER — Encounter: Payer: Self-pay | Admitting: Family

## 2019-11-05 VITALS — BP 121/74 | HR 65 | Temp 98.4°F | Resp 18 | Ht 60.0 in | Wt 157.8 lb

## 2019-11-05 DIAGNOSIS — Z87891 Personal history of nicotine dependence: Secondary | ICD-10-CM | POA: Diagnosis not present

## 2019-11-05 DIAGNOSIS — Z86010 Personal history of colonic polyps: Secondary | ICD-10-CM | POA: Insufficient documentation

## 2019-11-05 DIAGNOSIS — Z836 Family history of other diseases of the respiratory system: Secondary | ICD-10-CM | POA: Diagnosis not present

## 2019-11-05 DIAGNOSIS — Z85828 Personal history of other malignant neoplasm of skin: Secondary | ICD-10-CM | POA: Insufficient documentation

## 2019-11-05 DIAGNOSIS — Z800 Family history of malignant neoplasm of digestive organs: Secondary | ICD-10-CM | POA: Insufficient documentation

## 2019-11-05 DIAGNOSIS — R7989 Other specified abnormal findings of blood chemistry: Secondary | ICD-10-CM | POA: Insufficient documentation

## 2019-11-05 DIAGNOSIS — Z8616 Personal history of COVID-19: Secondary | ICD-10-CM | POA: Diagnosis not present

## 2019-11-05 DIAGNOSIS — K922 Gastrointestinal hemorrhage, unspecified: Secondary | ICD-10-CM | POA: Diagnosis not present

## 2019-11-05 DIAGNOSIS — Z86718 Personal history of other venous thrombosis and embolism: Secondary | ICD-10-CM | POA: Insufficient documentation

## 2019-11-05 DIAGNOSIS — K219 Gastro-esophageal reflux disease without esophagitis: Secondary | ICD-10-CM | POA: Diagnosis not present

## 2019-11-05 DIAGNOSIS — D500 Iron deficiency anemia secondary to blood loss (chronic): Secondary | ICD-10-CM | POA: Diagnosis not present

## 2019-11-05 DIAGNOSIS — Z818 Family history of other mental and behavioral disorders: Secondary | ICD-10-CM | POA: Insufficient documentation

## 2019-11-05 DIAGNOSIS — Z8249 Family history of ischemic heart disease and other diseases of the circulatory system: Secondary | ICD-10-CM | POA: Diagnosis not present

## 2019-11-05 DIAGNOSIS — D649 Anemia, unspecified: Secondary | ICD-10-CM

## 2019-11-05 DIAGNOSIS — Z79899 Other long term (current) drug therapy: Secondary | ICD-10-CM | POA: Insufficient documentation

## 2019-11-05 DIAGNOSIS — Z8673 Personal history of transient ischemic attack (TIA), and cerebral infarction without residual deficits: Secondary | ICD-10-CM | POA: Diagnosis not present

## 2019-11-05 LAB — CBC WITH DIFFERENTIAL (CANCER CENTER ONLY)
Abs Immature Granulocytes: 0.27 10*3/uL — ABNORMAL HIGH (ref 0.00–0.07)
Basophils Absolute: 0 10*3/uL (ref 0.0–0.1)
Basophils Relative: 0 %
Eosinophils Absolute: 0.2 10*3/uL (ref 0.0–0.5)
Eosinophils Relative: 1 %
HCT: 37.5 % (ref 36.0–46.0)
Hemoglobin: 11.6 g/dL — ABNORMAL LOW (ref 12.0–15.0)
Immature Granulocytes: 2 %
Lymphocytes Relative: 16 %
Lymphs Abs: 2.3 10*3/uL (ref 0.7–4.0)
MCH: 23.2 pg — ABNORMAL LOW (ref 26.0–34.0)
MCHC: 30.9 g/dL (ref 30.0–36.0)
MCV: 75.2 fL — ABNORMAL LOW (ref 80.0–100.0)
Monocytes Absolute: 0.9 10*3/uL (ref 0.1–1.0)
Monocytes Relative: 6 %
Neutro Abs: 10.8 10*3/uL — ABNORMAL HIGH (ref 1.7–7.7)
Neutrophils Relative %: 75 %
Platelet Count: 381 10*3/uL (ref 150–400)
RBC: 4.99 MIL/uL (ref 3.87–5.11)
RDW: 18.2 % — ABNORMAL HIGH (ref 11.5–15.5)
WBC Count: 14.5 10*3/uL — ABNORMAL HIGH (ref 4.0–10.5)
nRBC: 0 % (ref 0.0–0.2)

## 2019-11-05 LAB — CMP (CANCER CENTER ONLY)
ALT: 9 U/L (ref 0–44)
AST: 10 U/L — ABNORMAL LOW (ref 15–41)
Albumin: 4 g/dL (ref 3.5–5.0)
Alkaline Phosphatase: 53 U/L (ref 38–126)
Anion gap: 5 (ref 5–15)
BUN: 13 mg/dL (ref 8–23)
CO2: 31 mmol/L (ref 22–32)
Calcium: 9.9 mg/dL (ref 8.9–10.3)
Chloride: 104 mmol/L (ref 98–111)
Creatinine: 0.76 mg/dL (ref 0.44–1.00)
GFR, Est AFR Am: >60 mL/min (ref >60)
GFR, Estimated: >60 mL/min (ref >60)
Glucose, Bld: 84 mg/dL (ref 70–99)
Potassium: 4.5 mmol/L (ref 3.5–5.1)
Sodium: 140 mmol/L (ref 135–145)
Total Bilirubin: 0.2 mg/dL — ABNORMAL LOW (ref 0.3–1.2)
Total Protein: 6.7 g/dL (ref 6.5–8.1)

## 2019-11-05 LAB — RETICULOCYTES
Immature Retic Fract: 15 % (ref 2.3–15.9)
RBC.: 4.96 MIL/uL (ref 3.87–5.11)
Retic Count, Absolute: 52.1 10*3/uL (ref 19.0–186.0)
Retic Ct Pct: 1.1 % (ref 0.4–3.1)

## 2019-11-05 LAB — SAVE SMEAR(SSMR), FOR PROVIDER SLIDE REVIEW

## 2019-11-05 NOTE — Progress Notes (Signed)
Hematology/Oncology Consultation   Name: Erica Alvarez      MRN: [id number]    Location: Room/bed info not found  Date: 11/05/2019 Time:10:55 AM   REFERRING PHYSICIAN: Leighton Ruff, MD  REASON FOR CONSULT: Abnormal CBC   DIAGNOSIS: Iron deficiency anemia secondary to recent GI blood loss  HISTORY OF PRESENT ILLNESS: Erica Alvarez is a very pleasant 74 yo caucasian female with iron deficiency anemia.  She had C.Diff back in July and was hospitalized for several days due to duodenal diverticulitis with contained perforation. She completed a course of oral vancomycin and is feeling much better.  She takes her probiotic twice a day.  She had started an antibiotic for UTI the day before diagnosis with C.Diff.  She has not noted any more GI blood loss.  No bruising or petechiae.  She had Covid in October of 2020 and states that her taste is still off.  She has had a basal cell carcinoma removed in the past from her nose.  Her father had history of colon and stomach cancer.  No fever, chills, n/v, cough, rash, dizziness, SOB, chest pain, palpitations, abdominal pain or changes in bowel or bladder habits.  No swelling, tenderness, numbness or tingling in her extremities.  No falls or syncope.  No smoking, or ETOH.  Her appetite comes and goes but she is staying well hydrated. Her weight is stable.  She will be moving to [place] in November to be closer to her son and his family.   ROS: All other 10 point review of systems is negative.   PAST MEDICAL HISTORY:   Past Medical History:  Diagnosis Date  . Acute sinus infection    03-14-2014 current taking Z-Pak-- no fever but non-productive cough  . Benign essential tremor    takes inderal  . Cancer (HCC)    skin cancer 2 times  . Cataract    removed  . Diverticulitis   . Family history of adverse reaction to anesthesia    "sister had episode hypotension"  . GERD (gastroesophageal reflux disease)   . History of basal cell carcinoma  excision    nose  . History of colon polyps   . History of CVA (cerebrovascular accident) without residual deficits    [id number]  embolic right frontal MCA -- found to have PFO and Atrial Septum aneursym /  s/p  closure of PFO  . History of DVT of lower extremity    2003/left leg  . History of gastroesophageal reflux (GERD)   . Hyperlipidemia   . OA (osteoarthritis)    knees and hands  . Patellar clunk syndrome of left knee   . S/P patent foramen ovale closure dx post cva w/ PFO with atrial septal aneursym   07-01-2005  via Intracardiac echo probe with deployment of CardioSEAL septal occluder  . Stroke [facility]) 2007   due to patent foramen ovale  . Tendon tear    left gluteal tendon     ALLERGIES: Allergies  Allergen Reactions  . Hydrocodone Itching  . Penicillins Itching, Nausea And Vomiting and Other (See Comments)    Has patient had a PCN reaction causing immediate rash, facial/tongue/throat swelling, SOB or lightheadedness with hypotension: No Has patient had a PCN reaction causing severe rash involving mucus membranes or skin necrosis: No Has patient had a PCN reaction that required hospitalization: No Has patient had a PCN reaction occurring within the last 10 years: No If all of the above answers are "NO", then may proceed with  Cephalosporin use.  Rash  . Sulfa Antibiotics Nausea And Vomiting and Hives    itching  . Medrol [Methylprednisolone] Other (See Comments)    Disorientation/ hot flashes  . Sulfasalazine Nausea And Vomiting    itching  . Tetracycline Hives, Itching and Nausea And Vomiting      MEDICATIONS:  Current Outpatient Medications on File Prior to Visit  Medication Sig Dispense Refill  . acetaminophen (TYLENOL) 325 MG tablet Take 2 tablets (650 mg total) by mouth every 6 (six) hours as needed for mild pain (or temp > 100).    Marland Kitchen aspirin 81 MG tablet Take 81 mg by mouth daily.    . cetirizine (ZYRTEC) 10 MG tablet Take 10 mg by mouth daily.    .  clobetasol ointment (TEMOVATE) 4.17 % Apply 1 application topically 2 (two) times daily as needed (dermatitis).     . diazepam (VALIUM) 5 MG tablet Take 5 mg by mouth daily as needed for anxiety.     . fenofibrate micronized (LOFIBRA) 67 MG capsule Take 67 mg by mouth daily.     . fluconazole (DIFLUCAN) 150 MG tablet Take 150 mg by mouth as needed. (Patient not taking: Reported on 09/27/2019)    . fluticasone (FLONASE) 50 MCG/ACT nasal spray Place 2 sprays into both nostrils daily.     Marland Kitchen glycopyrrolate (ROBINUL) 1 MG tablet Take 2 tablets by mouth twice daily. 120 tablet 6  . Multiple Vitamin (MULTIVITAMIN) tablet Take 1 tablet by mouth daily.    . Omega-3 Fatty Acids (EQL OMEGA 3 FISH OIL) 1400 MG CAPS Take 1,400 mg by mouth daily.    Marland Kitchen omeprazole (PRILOSEC) 40 MG capsule Take 40 mg by mouth 2 (two) times daily.     . ondansetron (ZOFRAN) 4 MG tablet Take 1 tablet (4 mg total) by mouth every 6 (six) hours as needed for nausea or vomiting. 30 tablet 0  . ondansetron (ZOFRAN-ODT) 4 MG disintegrating tablet Take 1 tablet (4 mg total) by mouth every 6 (six) hours as needed for nausea. (Patient not taking: Reported on 09/27/2019) 10 tablet 0  . propranolol (INDERAL) 10 MG tablet Take 10 mg by mouth 2 (two) times daily.     Marland Kitchen Propylene Glycol (SYSTANE BALANCE) 0.6 % SOLN Place 1 drop into both eyes daily as needed (dry eyes).    . saccharomyces boulardii (FLORASTOR) 250 MG capsule Take 250 mg by mouth daily.     No current facility-administered medications on file prior to visit.     PAST SURGICAL HISTORY Past Surgical History:  Procedure Laterality Date  . ABDOMINAL HYSTERECTOMY  1982  . APPENDECTOMY  1960's  . COLONOSCOPY    . EYE SURGERY  2018   cataract extraction with lens placement  /Bil  . KNEE ARTHROSCOPY Bilateral left 02-22-2012/  right 11-03-2008   bilateral 2001  . KNEE ARTHROSCOPY Left 03/22/2015   Procedure: LEFT KNEE ARTHROSCOPY AND;  Surgeon: Gaynelle Arabian, MD;  Location: [facility];  Service: Orthopedics;  Laterality: Left;  . LAPAROSCOPIC CHOLECYSTECTOMY  1999  . OPEN SURGICAL REPAIR OF GLUTEAL TENDON Left 03/26/2017   Procedure: OPEN SURGICAL REPAIR OF LEFT GLUTEAL TENDON;  Surgeon: Gaynelle Arabian, MD;  Location: [facility];  Service: Orthopedics;  Laterality: Left;  . PATENT FORAMEN OVALE CLOSURE  07-01-2005  dr Einar Gip   via Intracardiac echocardiogram successful placement CardioSEAL septal occluder (PFO measured about 80mm)  . POLYPECTOMY    . SYNOVECTOMY Left 03/22/2015   Procedure: SYNOVECTOMY;  Surgeon: Pilar Plate  Aluisio, MD;  Location: [place];  Service: Orthopedics;  Laterality: Left;  . TOENAIL TRIMMING    . TONSILLECTOMY  1960's  . TOTAL HIP ARTHROPLASTY Right 04/29/2012   Procedure: TOTAL HIP ARTHROPLASTY ANTERIOR APPROACH;  Surgeon: [name], MD;  Location: [facility];  Service: Orthopedics;  Laterality: Right;  . TOTAL KNEE ARTHROPLASTY  12/10/2011   Procedure: TOTAL KNEE ARTHROPLASTY;  Surgeon: [name], MD;  Location: [facility];  Service: Orthopedics;  Laterality: Left;  . TOTAL KNEE ARTHROPLASTY Right 08-08-2008  . UPPER GASTROINTESTINAL ENDOSCOPY      FAMILY HISTORY: Family History  Problem Relation Age of Onset  . Dementia Mother   . Colon cancer Father 31  . Stomach cancer Father   . COPD Sister   . Coronary artery disease Sister   . Esophageal cancer Neg Hx   . Rectal cancer Neg Hx     SOCIAL HISTORY:  reports that she quit smoking about 39 years ago. She quit after 10.00 years of use. She has never used smokeless tobacco. She reports current alcohol use. She reports that she does not use drugs.  PERFORMANCE STATUS: The patient's performance status is 1 - Symptomatic but completely ambulatory  PHYSICAL EXAM: Most Recent Vital Signs: There were no vitals taken for this visit. LMP  (LMP Unknown)   General Appearance:    Alert, cooperative, no distress, appears stated age  Head:    Normocephalic,  without obvious abnormality, atraumatic  Eyes:    PERRL, conjunctiva/corneas clear, EOM's intact, fundi    benign, both eyes        Throat:   Lips, mucosa, and tongue normal; teeth and gums normal  Neck:   Supple, symmetrical, trachea midline, no adenopathy;    thyroid:  no enlargement/tenderness/nodules; no carotid   bruit or JVD  Back:     Symmetric, no curvature, ROM normal, no CVA tenderness  Lungs:     Clear to auscultation bilaterally, respirations unlabored  Chest Wall:    No tenderness or deformity   Heart:    Regular rate and rhythm, S1 and S2 normal, no murmur, rub   or gallop     Abdomen:     Soft, non-tender, bowel sounds active all four quadrants,    no masses, no organomegaly        Extremities:   Extremities normal, atraumatic, no cyanosis or edema  Pulses:   2+ and symmetric all extremities  Skin:   Skin color, texture, turgor normal, no rashes or lesions  Lymph nodes:   Cervical, supraclavicular, and axillary nodes normal  Neurologic:   CNII-XII intact, normal strength, sensation and reflexes    throughout    LABORATORY DATA:  No results found for this or any previous visit (from the past 48 hour(s)).    RADIOGRAPHY: No results found.     PATHOLOGY: None   ASSESSMENT/PLAN: Ms. Golebiewski is a very pleasant 74 yo caucasian female with iron deficiency anemia secondary to recent GI blood loss with C.Diff/diverticulitis with contained perforation.  She is feeling so much better and her Hgb has improved to 11.6, MCV 75 and platelets 381.  She has history of reactive leukocytosis. This is stable.  We will see what her iron studies look like and replace with IV iron if needed. She can not tolerate oral iron.  She will be moving to [place] in November.    All questions were answered and she is in agreement with the plan. She can contact  our office with any questions or concerns. We can certainly see her sooner if needed.   Laverna Peace, NP

## 2019-11-08 LAB — ERYTHROPOIETIN: Erythropoietin: 46.1 m[IU]/mL — ABNORMAL HIGH (ref 2.6–18.5)

## 2019-11-09 ENCOUNTER — Encounter: Payer: Self-pay | Admitting: Family

## 2019-11-09 ENCOUNTER — Telehealth: Payer: Self-pay | Admitting: Family

## 2019-11-09 LAB — LACTATE DEHYDROGENASE: LDH: 110 U/L (ref 98–192)

## 2019-11-09 LAB — IRON AND TIBC
Iron: 44 ug/dL (ref 41–142)
Saturation Ratios: 9 % — ABNORMAL LOW (ref 21–57)
TIBC: 464 ug/dL — ABNORMAL HIGH (ref 236–444)
UIBC: 420 ug/dL — ABNORMAL HIGH (ref 120–384)

## 2019-11-09 LAB — FERRITIN: Ferritin: 12 ng/mL (ref 11–307)

## 2019-11-09 NOTE — Telephone Encounter (Signed)
No los 9/3

## 2019-11-10 ENCOUNTER — Telehealth: Payer: Self-pay | Admitting: Family

## 2019-11-10 ENCOUNTER — Other Ambulatory Visit: Payer: Self-pay | Admitting: Family

## 2019-11-10 DIAGNOSIS — D509 Iron deficiency anemia, unspecified: Secondary | ICD-10-CM

## 2019-11-10 NOTE — Telephone Encounter (Signed)
I was able to speak with Erica Alvarez and go over her lab results and plan for Iv iron. She verbalized understanding and agreement with the plan. No other questions at this time.

## 2019-11-10 NOTE — Telephone Encounter (Signed)
Called and advised patient of appointments added per 9/7 late los for 9/3

## 2019-11-15 ENCOUNTER — Telehealth: Payer: Self-pay | Admitting: *Deleted

## 2019-11-15 ENCOUNTER — Telehealth: Payer: Self-pay | Admitting: Family

## 2019-11-15 ENCOUNTER — Ambulatory Visit: Payer: Medicare Other

## 2019-11-15 NOTE — Telephone Encounter (Signed)
Message received from patient wanting to know if it is OK for her to receive a steroid injection to her shoulder this week along with her iron infusions.  Call placed back to patient and patient notified per order of S. Hiltonia NP that it is ok for her to get a steroid injection to her shoulder along with iron infusions.  Pt appreciative of call back and has no further questions at this time.

## 2019-11-15 NOTE — Telephone Encounter (Signed)
Er 9/13 secure chat iron infusion for Feraheme needs to be moved out for at least 6 days.  Appointment for 9/20 was moved out until 9/24 and I LMVM for patient. I asked that she call back to confirm this date/time

## 2019-11-18 DIAGNOSIS — M25511 Pain in right shoulder: Secondary | ICD-10-CM | POA: Diagnosis not present

## 2019-11-19 ENCOUNTER — Other Ambulatory Visit: Payer: Self-pay

## 2019-11-19 ENCOUNTER — Inpatient Hospital Stay: Payer: Medicare Other

## 2019-11-19 VITALS — BP 122/69 | HR 67 | Temp 98.1°F | Resp 17

## 2019-11-19 DIAGNOSIS — Z85828 Personal history of other malignant neoplasm of skin: Secondary | ICD-10-CM | POA: Diagnosis not present

## 2019-11-19 DIAGNOSIS — D500 Iron deficiency anemia secondary to blood loss (chronic): Secondary | ICD-10-CM | POA: Diagnosis not present

## 2019-11-19 DIAGNOSIS — Z79899 Other long term (current) drug therapy: Secondary | ICD-10-CM | POA: Diagnosis not present

## 2019-11-19 DIAGNOSIS — R7989 Other specified abnormal findings of blood chemistry: Secondary | ICD-10-CM | POA: Diagnosis not present

## 2019-11-19 DIAGNOSIS — D509 Iron deficiency anemia, unspecified: Secondary | ICD-10-CM

## 2019-11-19 DIAGNOSIS — K219 Gastro-esophageal reflux disease without esophagitis: Secondary | ICD-10-CM | POA: Diagnosis not present

## 2019-11-19 DIAGNOSIS — K922 Gastrointestinal hemorrhage, unspecified: Secondary | ICD-10-CM | POA: Diagnosis not present

## 2019-11-19 MED ORDER — SODIUM CHLORIDE 0.9 % IV SOLN
510.0000 mg | Freq: Once | INTRAVENOUS | Status: AC
  Administered 2019-11-19: 510 mg via INTRAVENOUS
  Filled 2019-11-19: qty 510

## 2019-11-19 MED ORDER — SODIUM CHLORIDE 0.9 % IV SOLN
Freq: Once | INTRAVENOUS | Status: AC
  Filled 2019-11-19: qty 250

## 2019-11-19 NOTE — Patient Instructions (Signed)

## 2019-11-22 ENCOUNTER — Inpatient Hospital Stay: Payer: Medicare Other

## 2019-11-26 ENCOUNTER — Inpatient Hospital Stay: Payer: Medicare Other

## 2019-11-26 ENCOUNTER — Other Ambulatory Visit: Payer: Self-pay

## 2019-11-26 ENCOUNTER — Ambulatory Visit: Payer: Medicare Other

## 2019-11-26 VITALS — BP 139/65 | HR 70 | Temp 98.1°F | Resp 17

## 2019-11-26 DIAGNOSIS — D509 Iron deficiency anemia, unspecified: Secondary | ICD-10-CM

## 2019-11-26 DIAGNOSIS — Z79899 Other long term (current) drug therapy: Secondary | ICD-10-CM | POA: Diagnosis not present

## 2019-11-26 DIAGNOSIS — R7989 Other specified abnormal findings of blood chemistry: Secondary | ICD-10-CM | POA: Diagnosis not present

## 2019-11-26 DIAGNOSIS — D500 Iron deficiency anemia secondary to blood loss (chronic): Secondary | ICD-10-CM | POA: Diagnosis not present

## 2019-11-26 DIAGNOSIS — K219 Gastro-esophageal reflux disease without esophagitis: Secondary | ICD-10-CM | POA: Diagnosis not present

## 2019-11-26 DIAGNOSIS — K922 Gastrointestinal hemorrhage, unspecified: Secondary | ICD-10-CM | POA: Diagnosis not present

## 2019-11-26 DIAGNOSIS — Z85828 Personal history of other malignant neoplasm of skin: Secondary | ICD-10-CM | POA: Diagnosis not present

## 2019-11-26 MED ORDER — ACETAMINOPHEN 325 MG PO TABS
325.0000 mg | ORAL_TABLET | Freq: Once | ORAL | Status: AC
  Administered 2019-11-26: 325 mg via ORAL

## 2019-11-26 MED ORDER — SODIUM CHLORIDE 0.9 % IV SOLN
510.0000 mg | Freq: Once | INTRAVENOUS | Status: AC
  Administered 2019-11-26: 510 mg via INTRAVENOUS
  Filled 2019-11-26: qty 510

## 2019-11-26 MED ORDER — ACETAMINOPHEN 325 MG PO TABS
ORAL_TABLET | ORAL | Status: AC
  Filled 2019-11-26: qty 1

## 2019-11-26 MED ORDER — SODIUM CHLORIDE 0.9 % IV SOLN
Freq: Once | INTRAVENOUS | Status: AC
  Filled 2019-11-26: qty 250

## 2019-11-26 MED ORDER — DIPHENHYDRAMINE HCL 25 MG PO TABS
25.0000 mg | ORAL_TABLET | Freq: Once | ORAL | Status: AC
  Administered 2019-11-26: 25 mg via ORAL
  Filled 2019-11-26: qty 1

## 2019-11-26 MED ORDER — DIPHENHYDRAMINE HCL 25 MG PO CAPS
ORAL_CAPSULE | ORAL | Status: AC
  Filled 2019-11-26: qty 1

## 2019-11-26 NOTE — Patient Instructions (Signed)
Ferumoxytol injection (Feraheme) What is this medicine? FERUMOXYTOL is an iron complex. Iron is used to make healthy red blood cells, which carry oxygen and nutrients throughout the body. This medicine is used to treat iron deficiency anemia. This medicine may be used for other purposes; ask your health care provider or pharmacist if you have questions. COMMON BRAND NAME(S): Feraheme What should I tell my health care provider before I take this medicine? They need to know if you have any of these conditions:  anemia not caused by low iron levels  high levels of iron in the blood  magnetic resonance imaging (MRI) test scheduled  an unusual or allergic reaction to iron, other medicines, foods, dyes, or preservatives  pregnant or trying to get pregnant  breast-feeding How should I use this medicine? This medicine is for injection into a vein. It is given by a health care professional in a hospital or clinic setting. Talk to your pediatrician regarding the use of this medicine in children. Special care may be needed. Overdosage: If you think you have taken too much of this medicine contact a poison control center or emergency room at once. NOTE: This medicine is only for you. Do not share this medicine with others. What if I miss a dose? It is important not to miss your dose. Call your doctor or health care professional if you are unable to keep an appointment. What may interact with this medicine? This medicine may interact with the following medications:  other iron products This list may not describe all possible interactions. Give your health care provider a list of all the medicines, herbs, non-prescription drugs, or dietary supplements you use. Also tell them if you smoke, drink alcohol, or use illegal drugs. Some items may interact with your medicine. What should I watch for while using this medicine? Visit your doctor or healthcare professional regularly. Tell your doctor or  healthcare professional if your symptoms do not start to get better or if they get worse. You may need blood work done while you are taking this medicine. You may need to follow a special diet. Talk to your doctor. Foods that contain iron include: whole grains/cereals, dried fruits, beans, or peas, leafy green vegetables, and organ meats (liver, kidney). What side effects may I notice from receiving this medicine? Side effects that you should report to your doctor or health care professional as soon as possible:  allergic reactions like skin rash, itching or hives, swelling of the face, lips, or tongue  breathing problems  changes in blood pressure  feeling faint or lightheaded, falls  fever or chills  flushing, sweating, or hot feelings  swelling of the ankles or feet Side effects that usually do not require medical attention (report to your doctor or health care professional if they continue or are bothersome):  diarrhea  headache  nausea, vomiting  stomach pain This list may not describe all possible side effects. Call your doctor for medical advice about side effects. You may report side effects to FDA at 1-800-FDA-1088. Where should I keep my medicine? This drug is given in a hospital or clinic and will not be stored at home. NOTE: This sheet is a summary. It may not cover all possible information. If you have questions about this medicine, talk to your doctor, pharmacist, or health care provider.  2020 Elsevier/Gold Standard (2016-04-08 20:21:10)  

## 2019-11-26 NOTE — Progress Notes (Signed)
Patient states after she had her infusion she developed a headache and itching on the bilateral upper arms. Patient states she took tylenol and 25 mg benadryl PO and noticed relief of symptoms within an hour. Dr. Marin Olp notified order given and carried out for Tyelnol 325 mg PO and Benadryl 25 mg PO. Patient verbalized understanding.

## 2019-12-01 DIAGNOSIS — Z23 Encounter for immunization: Secondary | ICD-10-CM | POA: Diagnosis not present

## 2019-12-03 DIAGNOSIS — H6123 Impacted cerumen, bilateral: Secondary | ICD-10-CM | POA: Diagnosis not present

## 2019-12-10 DIAGNOSIS — L700 Acne vulgaris: Secondary | ICD-10-CM | POA: Diagnosis not present

## 2019-12-13 DIAGNOSIS — E559 Vitamin D deficiency, unspecified: Secondary | ICD-10-CM | POA: Diagnosis not present

## 2019-12-13 DIAGNOSIS — R7303 Prediabetes: Secondary | ICD-10-CM | POA: Diagnosis not present

## 2019-12-13 DIAGNOSIS — E782 Mixed hyperlipidemia: Secondary | ICD-10-CM | POA: Diagnosis not present

## 2019-12-13 DIAGNOSIS — D649 Anemia, unspecified: Secondary | ICD-10-CM | POA: Diagnosis not present

## 2019-12-17 ENCOUNTER — Inpatient Hospital Stay (HOSPITAL_BASED_OUTPATIENT_CLINIC_OR_DEPARTMENT_OTHER): Payer: Medicare Other | Admitting: Family

## 2019-12-17 ENCOUNTER — Other Ambulatory Visit: Payer: Self-pay

## 2019-12-17 ENCOUNTER — Encounter: Payer: Self-pay | Admitting: Family

## 2019-12-17 ENCOUNTER — Telehealth: Payer: Self-pay | Admitting: Family

## 2019-12-17 ENCOUNTER — Inpatient Hospital Stay: Payer: Medicare Other | Attending: Hematology & Oncology

## 2019-12-17 VITALS — BP 120/71 | HR 65 | Temp 98.0°F | Resp 18 | Ht 60.0 in | Wt 154.1 lb

## 2019-12-17 DIAGNOSIS — R5383 Other fatigue: Secondary | ICD-10-CM | POA: Diagnosis not present

## 2019-12-17 DIAGNOSIS — D500 Iron deficiency anemia secondary to blood loss (chronic): Secondary | ICD-10-CM | POA: Diagnosis not present

## 2019-12-17 DIAGNOSIS — B9689 Other specified bacterial agents as the cause of diseases classified elsewhere: Secondary | ICD-10-CM | POA: Insufficient documentation

## 2019-12-17 DIAGNOSIS — R7989 Other specified abnormal findings of blood chemistry: Secondary | ICD-10-CM | POA: Insufficient documentation

## 2019-12-17 DIAGNOSIS — Z79899 Other long term (current) drug therapy: Secondary | ICD-10-CM | POA: Diagnosis not present

## 2019-12-17 DIAGNOSIS — D509 Iron deficiency anemia, unspecified: Secondary | ICD-10-CM | POA: Diagnosis not present

## 2019-12-17 DIAGNOSIS — K5792 Diverticulitis of intestine, part unspecified, without perforation or abscess without bleeding: Secondary | ICD-10-CM | POA: Insufficient documentation

## 2019-12-17 DIAGNOSIS — K922 Gastrointestinal hemorrhage, unspecified: Secondary | ICD-10-CM | POA: Diagnosis not present

## 2019-12-17 LAB — CBC WITH DIFFERENTIAL (CANCER CENTER ONLY)
Abs Immature Granulocytes: 0.16 10*3/uL — ABNORMAL HIGH (ref 0.00–0.07)
Basophils Absolute: 0 10*3/uL (ref 0.0–0.1)
Basophils Relative: 0 %
Eosinophils Absolute: 0.2 10*3/uL (ref 0.0–0.5)
Eosinophils Relative: 1 %
HCT: 41.3 % (ref 36.0–46.0)
Hemoglobin: 13 g/dL (ref 12.0–15.0)
Immature Granulocytes: 1 %
Lymphocytes Relative: 19 %
Lymphs Abs: 2.3 10*3/uL (ref 0.7–4.0)
MCH: 26.5 pg (ref 26.0–34.0)
MCHC: 31.5 g/dL (ref 30.0–36.0)
MCV: 84.3 fL (ref 80.0–100.0)
Monocytes Absolute: 0.9 10*3/uL (ref 0.1–1.0)
Monocytes Relative: 7 %
Neutro Abs: 8.9 10*3/uL — ABNORMAL HIGH (ref 1.7–7.7)
Neutrophils Relative %: 72 %
Platelet Count: 295 10*3/uL (ref 150–400)
RBC: 4.9 MIL/uL (ref 3.87–5.11)
RDW: 23.4 % — ABNORMAL HIGH (ref 11.5–15.5)
WBC Count: 12.5 10*3/uL — ABNORMAL HIGH (ref 4.0–10.5)
nRBC: 0 % (ref 0.0–0.2)

## 2019-12-17 LAB — RETICULOCYTES
Immature Retic Fract: 11.8 % (ref 2.3–15.9)
RBC.: 4.95 MIL/uL (ref 3.87–5.11)
Retic Count, Absolute: 63.9 10*3/uL (ref 19.0–186.0)
Retic Ct Pct: 1.3 % (ref 0.4–3.1)

## 2019-12-17 NOTE — Telephone Encounter (Signed)
Appointments scheduled calendar printed & mailed per 10/15 los 

## 2019-12-17 NOTE — Progress Notes (Signed)
Hematology and Oncology Follow Up Visit  Erica Alvarez [id number] 08-23-1945 74 y.o. 12/17/2019   Principle Diagnosis:  Iron deficiency anemia secondary to GI blood loss  Current Therapy:   IV iron as indicated    Interim History:  Erica Alvarez is here today for follow-up. She is doing well but still has some fatigue at times. She states that she does not sleep well at night.  She did well with the IV iron infusions and can tell a bit of a difference.  No blood loss noted. No abnormal bruising, no petechiae.  No fever, chills, n/v, cough, rash, dizziness, SOB, chest pain, palpitations, abdominal pain or changes in bowel or bladder habits.  No swelling, tenderness, numbness or tingling in her extremities.  No falls or syncope.  She has maintained a good appetite and is staying well hydrated. Her weight is stable.   ECOG Performance Status: 1 - Symptomatic but completely ambulatory  Medications:  Allergies as of 12/17/2019      Reactions   Hydrocodone Itching   Medrol [methylprednisolone] Other (See Comments)   Disorientation/ hot flashes   Penicillins Itching, Nausea And Vomiting, Other (See Comments)   Has patient had a PCN reaction causing immediate rash, facial/tongue/throat swelling, SOB or lightheadedness with hypotension: No Has patient had a PCN reaction causing severe rash involving mucus membranes or skin necrosis: No Has patient had a PCN reaction that required hospitalization: No Has patient had a PCN reaction occurring within the last 10 years: No If all of the above answers are "NO", then may proceed with Cephalosporin use. Rash   Sulfa Antibiotics Nausea And Vomiting, Hives   itching   Sulfasalazine Itching, Nausea And Vomiting   Tetracycline Hives, Itching, Nausea And Vomiting      Medication List       Accurate as of December 17, 2019  2:05 PM. If you have any questions, ask your nurse or doctor.        acetaminophen 325 MG tablet Commonly known as:  TYLENOL Take 2 tablets (650 mg total) by mouth every 6 (six) hours as needed for mild pain (or temp > 100).   aspirin 81 MG tablet Take 81 mg by mouth daily.   cetirizine 10 MG tablet Commonly known as: ZYRTEC Take 10 mg by mouth daily.   clobetasol ointment 0.05 % Commonly known as: TEMOVATE Apply 1 application topically 2 (two) times daily as needed (dermatitis).   diazepam 5 MG tablet Commonly known as: VALIUM Take 5 mg by mouth daily as needed for anxiety.   EQL Omega 3 Fish Oil 1400 MG Caps Take 1,400 mg by mouth daily.   fenofibrate micronized 67 MG capsule Commonly known as: LOFIBRA Take 67 mg by mouth daily.   Florastor 250 MG capsule Generic drug: saccharomyces boulardii Take 250 mg by mouth daily.   fluconazole 150 MG tablet Commonly known as: DIFLUCAN Take 150 mg by mouth as needed.   fluticasone 50 MCG/ACT nasal spray Commonly known as: FLONASE Place 2 sprays into both nostrils daily.   glycopyrrolate 1 MG tablet Commonly known as: ROBINUL Take 2 tablets by mouth twice daily.   multivitamin tablet Take 1 tablet by mouth daily.   omeprazole 40 MG capsule Commonly known as: PRILOSEC Take 40 mg by mouth 2 (two) times daily.   ondansetron 4 MG disintegrating tablet Commonly known as: ZOFRAN-ODT Take 1 tablet (4 mg total) by mouth every 6 (six) hours as needed for nausea.   ondansetron 4 MG tablet  Commonly known as: ZOFRAN Take 1 tablet (4 mg total) by mouth every 6 (six) hours as needed for nausea or vomiting.   propranolol 10 MG tablet Commonly known as: INDERAL Take 10 mg by mouth 2 (two) times daily.   Systane Balance 0.6 % Soln Generic drug: Propylene Glycol Place 1 drop into both eyes daily as needed (dry eyes).       Allergies:  Allergies  Allergen Reactions  . Hydrocodone Itching  . Medrol [Methylprednisolone] Other (See Comments)    Disorientation/ hot flashes  . Penicillins Itching, Nausea And Vomiting and Other (See Comments)      Has patient had a PCN reaction causing immediate rash, facial/tongue/throat swelling, SOB or lightheadedness with hypotension: No Has patient had a PCN reaction causing severe rash involving mucus membranes or skin necrosis: No Has patient had a PCN reaction that required hospitalization: No Has patient had a PCN reaction occurring within the last 10 years: No If all of the above answers are "NO", then may proceed with Cephalosporin use.  Rash  . Sulfa Antibiotics Nausea And Vomiting and Hives    itching  . Sulfasalazine Itching and Nausea And Vomiting  . Tetracycline Hives, Itching and Nausea And Vomiting    Past Medical History, Surgical history, Social history, and Family History were reviewed and updated.  Review of Systems: All other 10 point review of systems is negative.   Physical Exam:  height is 5' (1.524 m) and weight is 154 lb 1.3 oz (69.9 kg). Her oral temperature is 98 F (36.7 C). Her blood pressure is 120/71 and her pulse is 65. Her respiration is 18 and oxygen saturation is 100%.   Wt Readings from Last 3 Encounters:  12/17/19 154 lb 1.3 oz (69.9 kg)  11/05/19 157 lb 12.8 oz (71.6 kg)  09/27/19 160 lb (72.6 kg)    Ocular: Sclerae unicteric, pupils equal, round and reactive to light Ear-nose-throat: Oropharynx clear, dentition fair Lymphatic: No cervical or supraclavicular adenopathy Lungs no rales or rhonchi, good excursion bilaterally Heart regular rate and rhythm, no murmur appreciated Abd soft, nontender, positive bowel sounds, no liver or spleen tip palpated on exam, no fluid wave  MSK no focal spinal tenderness, no joint edema Neuro: non-focal, well-oriented, appropriate affect Breasts: Deferred   Lab Results  Component Value Date   WBC 12.5 (H) 12/17/2019   HGB 13.0 12/17/2019   HCT 41.3 12/17/2019   MCV 84.3 12/17/2019   PLT 295 12/17/2019   Lab Results  Component Value Date   FERRITIN 12 11/05/2019   IRON 44 11/05/2019   TIBC 464 (H)  11/05/2019   UIBC 420 (H) 11/05/2019   IRONPCTSAT 9 (L) 11/05/2019   Lab Results  Component Value Date   RETICCTPCT 1.3 12/17/2019   RBC 4.90 12/17/2019   No results found for: KPAFRELGTCHN, LAMBDASER, [facility] Lab Results  Component Value Date   IGA 239 08/02/2013   No results found for: Kathrynn Ducking, MSPIKE, SPEI   Chemistry      Component Value Date/Time   NA 140 11/05/2019 1043   K 4.5 11/05/2019 1043   CL 104 11/05/2019 1043   CO2 31 11/05/2019 1043   BUN 13 11/05/2019 1043   CREATININE 0.76 11/05/2019 1043      Component Value Date/Time   CALCIUM 9.9 11/05/2019 1043   ALKPHOS 53 11/05/2019 1043   AST 10 (L) 11/05/2019 1043   ALT 9 11/05/2019 1043   BILITOT 0.2 (L) 11/05/2019 1043  Impression and Plan: Ms. Boster is a very pleasant 74 yo caucasian female with iron deficiency anemia secondary to recent GI blood loss with C.Diff/diverticulitis with contained perforation.  She has done well with IV iron. Hgb is now 13.0, MCV 84.  Iron studies are pending and we can replace if needed.  Follow-up in 3 months prior to her moving to [place].  She can contact our office with any questions or concerns.   Laverna Peace, NP 10/15/20212:05 PM

## 2019-12-20 LAB — IRON AND TIBC
Iron: 60 ug/dL (ref 41–142)
Saturation Ratios: 18 % — ABNORMAL LOW (ref 21–57)
TIBC: 324 ug/dL (ref 236–444)
UIBC: 264 ug/dL (ref 120–384)

## 2019-12-20 LAB — FERRITIN: Ferritin: 323 ng/mL — ABNORMAL HIGH (ref 11–307)

## 2019-12-23 ENCOUNTER — Other Ambulatory Visit: Payer: Self-pay | Admitting: Family Medicine

## 2019-12-23 DIAGNOSIS — K219 Gastro-esophageal reflux disease without esophagitis: Secondary | ICD-10-CM | POA: Diagnosis not present

## 2019-12-23 DIAGNOSIS — R7303 Prediabetes: Secondary | ICD-10-CM | POA: Diagnosis not present

## 2019-12-23 DIAGNOSIS — E782 Mixed hyperlipidemia: Secondary | ICD-10-CM | POA: Diagnosis not present

## 2019-12-23 DIAGNOSIS — M8588 Other specified disorders of bone density and structure, other site: Secondary | ICD-10-CM | POA: Diagnosis not present

## 2019-12-23 DIAGNOSIS — Z8719 Personal history of other diseases of the digestive system: Secondary | ICD-10-CM | POA: Diagnosis not present

## 2019-12-23 DIAGNOSIS — F419 Anxiety disorder, unspecified: Secondary | ICD-10-CM | POA: Diagnosis not present

## 2019-12-23 DIAGNOSIS — Z85828 Personal history of other malignant neoplasm of skin: Secondary | ICD-10-CM | POA: Diagnosis not present

## 2019-12-23 DIAGNOSIS — Z23 Encounter for immunization: Secondary | ICD-10-CM | POA: Diagnosis not present

## 2019-12-23 DIAGNOSIS — Z0000 Encounter for general adult medical examination without abnormal findings: Secondary | ICD-10-CM | POA: Diagnosis not present

## 2019-12-23 DIAGNOSIS — D509 Iron deficiency anemia, unspecified: Secondary | ICD-10-CM | POA: Diagnosis not present

## 2019-12-23 DIAGNOSIS — E559 Vitamin D deficiency, unspecified: Secondary | ICD-10-CM | POA: Diagnosis not present

## 2019-12-23 DIAGNOSIS — G250 Essential tremor: Secondary | ICD-10-CM | POA: Diagnosis not present

## 2019-12-27 ENCOUNTER — Other Ambulatory Visit: Payer: Self-pay

## 2019-12-27 ENCOUNTER — Inpatient Hospital Stay: Payer: Medicare Other

## 2019-12-27 VITALS — BP 143/68 | HR 66 | Temp 97.8°F | Resp 18

## 2019-12-27 DIAGNOSIS — K922 Gastrointestinal hemorrhage, unspecified: Secondary | ICD-10-CM | POA: Diagnosis not present

## 2019-12-27 DIAGNOSIS — B9689 Other specified bacterial agents as the cause of diseases classified elsewhere: Secondary | ICD-10-CM | POA: Diagnosis not present

## 2019-12-27 DIAGNOSIS — D509 Iron deficiency anemia, unspecified: Secondary | ICD-10-CM

## 2019-12-27 DIAGNOSIS — R5383 Other fatigue: Secondary | ICD-10-CM | POA: Diagnosis not present

## 2019-12-27 DIAGNOSIS — D500 Iron deficiency anemia secondary to blood loss (chronic): Secondary | ICD-10-CM | POA: Diagnosis not present

## 2019-12-27 DIAGNOSIS — R7989 Other specified abnormal findings of blood chemistry: Secondary | ICD-10-CM | POA: Diagnosis not present

## 2019-12-27 DIAGNOSIS — K5792 Diverticulitis of intestine, part unspecified, without perforation or abscess without bleeding: Secondary | ICD-10-CM | POA: Diagnosis not present

## 2019-12-27 MED ORDER — SODIUM CHLORIDE 0.9 % IV SOLN
Freq: Once | INTRAVENOUS | Status: AC
  Filled 2019-12-27: qty 250

## 2019-12-27 MED ORDER — SODIUM CHLORIDE 0.9 % IV SOLN
510.0000 mg | Freq: Once | INTRAVENOUS | Status: AC
  Administered 2019-12-27: 510 mg via INTRAVENOUS
  Filled 2019-12-27: qty 510

## 2019-12-27 NOTE — Progress Notes (Signed)
Pt discharged in no apparent distress. Pt left ambulatory without assistance. Pt aware of discharge instructions and verbalized understanding and had no further questions.  

## 2019-12-27 NOTE — Patient Instructions (Signed)

## 2019-12-27 NOTE — Telephone Encounter (Signed)
error 

## 2020-01-03 ENCOUNTER — Encounter: Payer: Self-pay | Admitting: Nurse Practitioner

## 2020-01-03 ENCOUNTER — Ambulatory Visit (INDEPENDENT_AMBULATORY_CARE_PROVIDER_SITE_OTHER): Payer: Medicare Other | Admitting: Nurse Practitioner

## 2020-01-03 VITALS — BP 118/80 | HR 85 | Ht 59.65 in | Wt 152.0 lb

## 2020-01-03 DIAGNOSIS — D509 Iron deficiency anemia, unspecified: Secondary | ICD-10-CM | POA: Diagnosis not present

## 2020-01-03 DIAGNOSIS — K5712 Diverticulitis of small intestine without perforation or abscess without bleeding: Secondary | ICD-10-CM | POA: Diagnosis not present

## 2020-01-03 DIAGNOSIS — K219 Gastro-esophageal reflux disease without esophagitis: Secondary | ICD-10-CM | POA: Diagnosis not present

## 2020-01-03 MED ORDER — FAMOTIDINE 20 MG PO TABS: 20.0000 mg | ORAL_TABLET | Freq: Every day | ORAL | 2 refills | Status: DC

## 2020-01-03 NOTE — Patient Instructions (Signed)
If you are age 75 or older, your body mass index should be between 23-30. Your Body mass index is 30.03 kg/m. If this is out of the aforementioned range listed, please consider follow up with your Primary Care Provider.  If you are age 23 or younger, your body mass index should be between 19-25. Your Body mass index is 30.03 kg/m. If this is out of the aformentioned range listed, please consider follow up with your Primary Care Provider.   Add Famotidine 20 mg at bedtime. This has ben sent to you pharmacy.  Continue to follow up with Hematology.  Follow up with Dr. Fuller Plan in 3 months.

## 2020-01-03 NOTE — Progress Notes (Signed)
01/03/2020 Erica Alvarez [id number] May 23, 1945   Chief Complaint: iron deficiency anemia   History of Present Illness: Erica Alvarez is a 74 year old female with a past medical history of osteoarthritis, essential tremor, CVA 2007, PFO, esophageal stricture, GERD, C. Diff, duodenal diverticulitis and colon polyps. She was last seen in our office by Dr. Fuller Plan on 09/27/2019 following her hospitalization for duodenal diverticulitis with a contained perforation and C. Diff infection treated with a course of Vancomycin and Florastor (6 weeks) the [employer] She was also noted to have IDA and she received IV iron 9/17, 9/24 and 12/27/2019. Her baseline Hg in 03/2017 was 11.7. Admission Hg 10.7 on 09/04/2019. Discharge Hg 9.3 on 09/13/2019.  She presents today for further GI follow up. She reported having increased reflux symptoms for the past 4 weeks which has improved over the past few days. She is taking Omeprazole 40mg  po bid and Rolaids or TUMs twice daily as needed. No dysphagia. No upper or lower abdominal pain. She is passing a normal brown BM most days. No rectal bleeding or melena. Labs 10/15 showed a Hg level of 13. She is eating a healthier diet and she elected to avoid seeds and nuts. She has intentionally lost 12 lbs over the past 3 to 4 months. Her most recent EGD was 03/2019 which showed a benign esophageal stenosis, gastritis and fundic gland polyps. No evidence of H. Pylori. The duodenum was normal therefore duodenal biopsies were not obtained.  She underwent a colonoscopy 10/22/2017 which showed diverticulosis to the sigmoid colon and internal hemorrhoids. She and her husband are moving to [place] in January 2022 to be closer to family.    CBC Latest Ref Rng & Units 12/17/2019 11/05/2019 09/13/2019  WBC 4.0 - 10.5 K/uL 12.5(H) 14.5(H) 15.1(H)  Hemoglobin 12.0 - 15.0 g/dL 13.0 11.6(L) 9.3(L)  Hematocrit 36 - 46 % 41.3 37.5 30.2(L)  Platelets 150 - 400 K/uL 295 381 413(H)    CMP Latest Ref  Rng & Units 11/05/2019 09/12/2019 09/11/2019  Glucose 70 - 99 mg/dL 84 - 127(H)  BUN 8 - 23 mg/dL 13 - 5(L)  Creatinine 0.44 - 1.00 mg/dL 0.76 0.57 0.56  Sodium 135 - 145 mmol/L 140 - 140  Potassium 3.5 - 5.1 mmol/L 4.5 - 3.7  Chloride 98 - 111 mmol/L 104 - 106  CO2 22 - 32 mmol/L 31 - 26  Calcium 8.9 - 10.3 mg/dL 9.9 - 8.8(L)  Total Protein 6.5 - 8.1 g/dL 6.7 - -  Total Bilirubin 0.3 - 1.2 mg/dL 0.2(L) - -  Alkaline Phos 38 - 126 U/L 53 - -  AST 15 - 41 U/L 10(L) - -  ALT 0 - 44 U/L 9 - -    Iron/TIBC/Ferritin/ %Sat    Component Value Date/Time   IRON 60 12/17/2019 1324   TIBC 324 12/17/2019 1324   FERRITIN 323 (H) 12/17/2019 1324   IRONPCTSAT 18 (L) 12/17/2019 1324   Colonoscopy 10/22/2017: - Moderate diverticulosis in the left colon. There was no evidence of diverticular bleeding. - Internal hemorrhoids. - The examination was otherwise normal on direct and retroflexion views. - No specimens collected. - 5 year recall   EGD 03/27/2017: - Benign-appearing esophageal stenosis. Dilated. - A few gastric polyps. Biopsied. - Gastritis. Biopsied. - Normal duodenal bulb and second portion of the duodenum. 1. Surgical [P], gastric antrum - ANTRAL AND OXYNTIC MUCOSA WITH MILD CHANGES CONSISTENT WITH REACTIVE GASTROPATHY. [name] NEGATIVE FOR HELICOBACTER PYLORI. - NO INTESTINAL  METAPLASIA, DYSPLASIA OR MALIGNANCY. 2. Surgical [P], gastric body polyp BX - FUNDIC GLAND POLYP(S). - NO INTESTINAL METAPLASIA, ADENOMATOUS CHANGE OR MALIGNANCY.  CTAP with contrast 09/05/2019: 1. Perforated duodenal diverticulitis. With small amount of free air and fluid contained in the retroperitoneum, confined predominantly to the pancreaticoduodenal groove. 2. Fecalization of the distal small bowel contents may reflect slowed intestinal transit. 3. Prior cholecystectomy, hysterectomy, appendectomy. 4. Aortic Atherosclerosis   CTAP with contrast 09/13/2019: 1. Free air and inflammatory  changes noted around the proximal duodenum on prior exam appear to be nearly resolved currently. Air-fluid collection is seen adjacent to third portion of duodenum which was present on prior exam of 2017 and most likely represents duodenal diverticulum. 2. No other abnormality seen in the abdomen or pelvis.  Current Outpatient Medications on File Prior to Visit  Medication Sig Dispense Refill  . acetaminophen (TYLENOL) 325 MG tablet Take 2 tablets (650 mg total) by mouth every 6 (six) hours as needed for mild pain (or temp > 100).    Marland Kitchen aspirin 81 MG tablet Take 81 mg by mouth daily.    . cetirizine (ZYRTEC) 10 MG tablet Take 10 mg by mouth daily.    . clobetasol ointment (TEMOVATE) 1.69 % Apply 1 application topically 2 (two) times daily as needed (dermatitis).     . diazepam (VALIUM) 5 MG tablet Take 5 mg by mouth daily as needed for anxiety.     . fenofibrate micronized (LOFIBRA) 67 MG capsule Take 67 mg by mouth daily.     . fluconazole (DIFLUCAN) 150 MG tablet Take 150 mg by mouth as needed.     . fluticasone (FLONASE) 50 MCG/ACT nasal spray Place 2 sprays into both nostrils daily.     Marland Kitchen glycopyrrolate (ROBINUL) 1 MG tablet Take 2 tablets by mouth twice daily. 120 tablet 6  . Multiple Vitamin (MULTIVITAMIN) tablet Take 1 tablet by mouth daily.    . Omega-3 Fatty Acids (EQL OMEGA 3 FISH OIL) 1400 MG CAPS Take 1,400 mg by mouth daily.    Marland Kitchen omeprazole (PRILOSEC) 40 MG capsule Take 40 mg by mouth 2 (two) times daily.     . ondansetron (ZOFRAN) 4 MG tablet Take 1 tablet (4 mg total) by mouth every 6 (six) hours as needed for nausea or vomiting. 30 tablet 0  . ondansetron (ZOFRAN-ODT) 4 MG disintegrating tablet Take 1 tablet (4 mg total) by mouth every 6 (six) hours as needed for nausea. 10 tablet 0  . propranolol (INDERAL) 10 MG tablet Take 10 mg by mouth 2 (two) times daily.     Marland Kitchen Propylene Glycol (SYSTANE BALANCE) 0.6 % SOLN Place 1 drop into both eyes daily as needed (dry eyes).    .  saccharomyces boulardii (FLORASTOR) 250 MG capsule Take 250 mg by mouth daily.     No current facility-administered medications on file prior to visit.   Allergies  Allergen Reactions  . Hydrocodone Itching  . Medrol [Methylprednisolone] Other (See Comments)    Disorientation/ hot flashes  . Penicillins Itching, Nausea And Vomiting and Other (See Comments)    Has patient had a PCN reaction causing immediate rash, facial/tongue/throat swelling, SOB or lightheadedness with hypotension: No Has patient had a PCN reaction causing severe rash involving mucus membranes or skin necrosis: No Has patient had a PCN reaction that required hospitalization: No Has patient had a PCN reaction occurring within the last 10 years: No If all of the above answers are "NO", then may proceed with Cephalosporin use.  Rash  . Sulfa Antibiotics Nausea And Vomiting and Hives    itching  . Cefdinir Diarrhea    Caused C Diff diarrhea  . Sulfasalazine Itching and Nausea And Vomiting  . Tetracycline Hives, Itching and Nausea And Vomiting    Current Medications, Allergies, Past Medical History, Past Surgical History, Family History and Social History were reviewed in [employer] record.   Review of Systems:   Constitutional: Negative for fever, sweats, chills or weight loss.  Respiratory: Negative for shortness of breath.   Cardiovascular: Negative for chest pain, palpitations and leg swelling.  Gastrointestinal: See HPI.  Musculoskeletal: Negative for back pain or muscle aches.  Neurological: Negative for dizziness, headaches or paresthesias.    Physical Exam: LMP  (LMP Unknown)   BP 118/80   Pulse 85   Ht 4' 11.65" (1.515 m)   Wt 152 lb (68.9 kg)   LMP  (LMP Unknown)   SpO2 95%   BMI 30.03 kg/m  General: Well developed 74 year old female in no acute distress. Head: Normocephalic and atraumatic. Eyes: No scleral icterus. Conjunctiva pink . Ears: Normal auditory  acuity. Lungs: Clear throughout to auscultation. Heart: Regular rate and rhythm, no murmur. Abdomen: Soft, nontender and nondistended. No masses or hepatomegaly. Normal bowel sounds x 4 quadrants.  Rectal: Deferred. Musculoskeletal: Symmetrical with no gross deformities. Extremities: No edema. Neurological: Alert oriented x 4. No focal deficits.  Psychological: Alert and cooperative. Normal mood and affect  Assessment and Recommendations:  10. 74 year old female with IDA secondary to GI blood loss. Stable Hg 13.  -Continue follow up including repeat CBC and iron panel with hematology   2. Duodenal diverticulitis with a contained perforation 09/05/2019. See CTAP 7/4 and 09/13/2019 above.  -Dr. Fuller Plan to verify if repeat CTAP required to follow up on duodenal diverticulitis/perforation. -Follow up with Dr. Fuller Plan in 2 to 3 months, prior to relocating to [place] in [place]   3. GERD -Continue Omeprazole 40mg   Bid. Add Famotidine 20mg  Q HS. -Patient to call our office if reflux symptoms persist or worsen   4. C. Diff treated with Vanco and Florastor 09/2019  5. History of a CVA on ASA

## 2020-01-04 NOTE — Progress Notes (Signed)
Reviewed and agree with management plan. Repeat CT AP in hospital revealed near resolution of perforation so general surgery did not recommend another CT.  Pricilla Riffle. Fuller Plan, MD [facility]

## 2020-01-19 DIAGNOSIS — R300 Dysuria: Secondary | ICD-10-CM | POA: Diagnosis not present

## 2020-01-24 DIAGNOSIS — Z01419 Encounter for gynecological examination (general) (routine) without abnormal findings: Secondary | ICD-10-CM | POA: Diagnosis not present

## 2020-01-24 DIAGNOSIS — L900 Lichen sclerosus et atrophicus: Secondary | ICD-10-CM | POA: Diagnosis not present

## 2020-01-31 ENCOUNTER — Encounter: Payer: Self-pay | Admitting: Podiatry

## 2020-01-31 ENCOUNTER — Other Ambulatory Visit: Payer: Self-pay

## 2020-01-31 ENCOUNTER — Ambulatory Visit (INDEPENDENT_AMBULATORY_CARE_PROVIDER_SITE_OTHER): Payer: Medicare Other

## 2020-01-31 ENCOUNTER — Ambulatory Visit (INDEPENDENT_AMBULATORY_CARE_PROVIDER_SITE_OTHER): Payer: Medicare Other | Admitting: Podiatry

## 2020-01-31 DIAGNOSIS — M7752 Other enthesopathy of left foot: Secondary | ICD-10-CM | POA: Diagnosis not present

## 2020-01-31 DIAGNOSIS — M79671 Pain in right foot: Secondary | ICD-10-CM | POA: Diagnosis not present

## 2020-01-31 DIAGNOSIS — M79672 Pain in left foot: Secondary | ICD-10-CM | POA: Diagnosis not present

## 2020-01-31 DIAGNOSIS — M7750 Other enthesopathy of unspecified foot: Secondary | ICD-10-CM

## 2020-01-31 DIAGNOSIS — M722 Plantar fascial fibromatosis: Secondary | ICD-10-CM

## 2020-01-31 MED ORDER — TRIAMCINOLONE ACETONIDE 10 MG/ML IJ SUSP
10.0000 mg | Freq: Once | INTRAMUSCULAR | Status: AC
  Administered 2020-01-31: 10 mg

## 2020-01-31 MED ORDER — METHYLPREDNISOLONE 4 MG PO TBPK: ORAL_TABLET | ORAL | 0 refills | Status: AC

## 2020-02-01 ENCOUNTER — Inpatient Hospital Stay (HOSPITAL_BASED_OUTPATIENT_CLINIC_OR_DEPARTMENT_OTHER): Payer: Medicare Other | Admitting: Family

## 2020-02-01 ENCOUNTER — Inpatient Hospital Stay: Payer: Medicare Other | Attending: Hematology & Oncology

## 2020-02-01 VITALS — BP 136/69 | HR 71 | Resp 17

## 2020-02-01 DIAGNOSIS — R7989 Other specified abnormal findings of blood chemistry: Secondary | ICD-10-CM | POA: Diagnosis present

## 2020-02-01 DIAGNOSIS — R5383 Other fatigue: Secondary | ICD-10-CM | POA: Diagnosis not present

## 2020-02-01 DIAGNOSIS — K5792 Diverticulitis of intestine, part unspecified, without perforation or abscess without bleeding: Secondary | ICD-10-CM | POA: Insufficient documentation

## 2020-02-01 DIAGNOSIS — Z885 Allergy status to narcotic agent status: Secondary | ICD-10-CM | POA: Diagnosis not present

## 2020-02-01 DIAGNOSIS — Z79899 Other long term (current) drug therapy: Secondary | ICD-10-CM | POA: Diagnosis not present

## 2020-02-01 DIAGNOSIS — D509 Iron deficiency anemia, unspecified: Secondary | ICD-10-CM

## 2020-02-01 DIAGNOSIS — M722 Plantar fascial fibromatosis: Secondary | ICD-10-CM | POA: Diagnosis not present

## 2020-02-01 DIAGNOSIS — M779 Enthesopathy, unspecified: Secondary | ICD-10-CM | POA: Diagnosis not present

## 2020-02-01 DIAGNOSIS — Z880 Allergy status to penicillin: Secondary | ICD-10-CM | POA: Insufficient documentation

## 2020-02-01 DIAGNOSIS — D500 Iron deficiency anemia secondary to blood loss (chronic): Secondary | ICD-10-CM | POA: Insufficient documentation

## 2020-02-01 DIAGNOSIS — Z882 Allergy status to sulfonamides status: Secondary | ICD-10-CM | POA: Insufficient documentation

## 2020-02-01 DIAGNOSIS — K922 Gastrointestinal hemorrhage, unspecified: Secondary | ICD-10-CM | POA: Insufficient documentation

## 2020-02-01 DIAGNOSIS — B9689 Other specified bacterial agents as the cause of diseases classified elsewhere: Secondary | ICD-10-CM | POA: Insufficient documentation

## 2020-02-01 DIAGNOSIS — Z881 Allergy status to other antibiotic agents status: Secondary | ICD-10-CM | POA: Diagnosis not present

## 2020-02-01 LAB — CBC WITH DIFFERENTIAL (CANCER CENTER ONLY)
Abs Immature Granulocytes: 0.21 10*3/uL — ABNORMAL HIGH (ref 0.00–0.07)
Basophils Absolute: 0 10*3/uL (ref 0.0–0.1)
Basophils Relative: 0 %
Eosinophils Absolute: 0 10*3/uL (ref 0.0–0.5)
Eosinophils Relative: 0 %
HCT: 43 % (ref 36.0–46.0)
Hemoglobin: 14.3 g/dL (ref 12.0–15.0)
Immature Granulocytes: 1 %
Lymphocytes Relative: 10 %
Lymphs Abs: 2.3 10*3/uL (ref 0.7–4.0)
MCH: 29.2 pg (ref 26.0–34.0)
MCHC: 33.3 g/dL (ref 30.0–36.0)
MCV: 87.8 fL (ref 80.0–100.0)
Monocytes Absolute: 1.2 10*3/uL — ABNORMAL HIGH (ref 0.1–1.0)
Monocytes Relative: 5 %
Neutro Abs: 19.1 10*3/uL — ABNORMAL HIGH (ref 1.7–7.7)
Neutrophils Relative %: 84 %
Platelet Count: 335 10*3/uL (ref 150–400)
RBC: 4.9 MIL/uL (ref 3.87–5.11)
RDW: 19.6 % — ABNORMAL HIGH (ref 11.5–15.5)
WBC Count: 22.8 10*3/uL — ABNORMAL HIGH (ref 4.0–10.5)
nRBC: 0 % (ref 0.0–0.2)

## 2020-02-01 LAB — RETICULOCYTES
Immature Retic Fract: 14.3 % (ref 2.3–15.9)
RBC.: 4.94 MIL/uL (ref 3.87–5.11)
Retic Count, Absolute: 64.7 10*3/uL (ref 19.0–186.0)
Retic Ct Pct: 1.3 % (ref 0.4–3.1)

## 2020-02-01 NOTE — Progress Notes (Signed)
Hematology and Oncology Follow Up Visit  Erica Alvarez [id number] 09/06/1945 74 y.o. 02/01/2020   Principle Diagnosis:  Iron deficiency anemia secondary to GI blood loss  Current Therapy:        IV iron as indicated    Interim History:  Erica Alvarez is here today for follow-up. She is doing well but does have some occasional fatigue.  She over did it a little over thanksgiving and exacerbated her tendonitis and plantar fascitis in the left foot. She is wearing a boot for now to give it a rest.  She states that he gave her an injection yesterday while in the office and she is currently on a steroid dose pack. WBC count is elevated at 22.8.  She will be going to [employer] on 12/10 with her sweet family. After that they will be closing on a home and moving to [place].  She has not noted any blood loss. No abnormal bruising, no petechiae.  No fever, chills, n/v, cough, rash, dizziness, SOB, chest pain, palpitations, abdominal pain or changes in bowel or bladder habits.  No numbness or tingling in her extremities at this time.  No falls or syncopal episodes to report.  She has maintained a good appetite and is staying well hydrated. Her weight is stable.   ECOG Performance Status: 1 - Symptomatic but completely ambulatory  Medications:  Allergies as of 02/01/2020      Reactions   Hydrocodone Itching   Medrol [methylprednisolone] Other (See Comments)   Disorientation/ hot flashes   Penicillins Itching, Nausea And Vomiting, Other (See Comments)   Has patient had a PCN reaction causing immediate rash, facial/tongue/throat swelling, SOB or lightheadedness with hypotension: No Has patient had a PCN reaction causing severe rash involving mucus membranes or skin necrosis: No Has patient had a PCN reaction that required hospitalization: No Has patient had a PCN reaction occurring within the last 10 years: No If all of the above answers are "NO", then may proceed with Cephalosporin  use. Rash   Sulfa Antibiotics Nausea And Vomiting, Hives   itching   Cefdinir Diarrhea   Caused C Diff diarrhea   Sulfasalazine Itching, Nausea And Vomiting   Tetracycline Hives, Itching, Nausea And Vomiting      Medication List       Accurate as of February 01, 2020  1:26 PM. If you have any questions, ask your nurse or doctor.        acetaminophen 325 MG tablet Commonly known as: TYLENOL Take 2 tablets (650 mg total) by mouth every 6 (six) hours as needed for mild pain (or temp > 100).   aspirin 81 MG tablet Take 81 mg by mouth daily.   cetirizine 10 MG tablet Commonly known as: ZYRTEC Take 10 mg by mouth daily.   clobetasol ointment 0.05 % Commonly known as: TEMOVATE Apply 1 application topically 2 (two) times daily as needed (dermatitis).   diazepam 5 MG tablet Commonly known as: VALIUM Take 5 mg by mouth daily as needed for anxiety.   EQL Omega 3 Fish Oil 1400 MG Caps Take 1,400 mg by mouth daily.   famotidine 20 MG tablet Commonly known as: PEPCID Take 1 tablet (20 mg total) by mouth at bedtime.   fenofibrate micronized 67 MG capsule Commonly known as: LOFIBRA Take 67 mg by mouth daily.   Florastor 250 MG capsule Generic drug: saccharomyces boulardii Take 250 mg by mouth daily.   fluconazole 150 MG tablet Commonly known as: DIFLUCAN Take 150  mg by mouth as needed.   fluticasone 50 MCG/ACT nasal spray Commonly known as: FLONASE Place 2 sprays into both nostrils daily.   glycopyrrolate 1 MG tablet Commonly known as: ROBINUL Take 2 tablets by mouth twice daily.   methylPREDNISolone 4 MG Tbpk tablet Commonly known as: MEDROL DOSEPAK follow package directions   multivitamin tablet Take 1 tablet by mouth daily.   omeprazole 40 MG capsule Commonly known as: PRILOSEC Take 40 mg by mouth 2 (two) times daily.   ondansetron 4 MG disintegrating tablet Commonly known as: ZOFRAN-ODT Take 1 tablet (4 mg total) by mouth every 6 (six) hours as needed  for nausea.   ondansetron 4 MG tablet Commonly known as: ZOFRAN Take 1 tablet (4 mg total) by mouth every 6 (six) hours as needed for nausea or vomiting.   propranolol 10 MG tablet Commonly known as: INDERAL Take 10 mg by mouth 2 (two) times daily.   Systane Balance 0.6 % Soln Generic drug: Propylene Glycol Place 1 drop into both eyes daily as needed (dry eyes).       Allergies:  Allergies  Allergen Reactions   Hydrocodone Itching   Medrol [Methylprednisolone] Other (See Comments)    Disorientation/ hot flashes   Penicillins Itching, Nausea And Vomiting and Other (See Comments)    Has patient had a PCN reaction causing immediate rash, facial/tongue/throat swelling, SOB or lightheadedness with hypotension: No Has patient had a PCN reaction causing severe rash involving mucus membranes or skin necrosis: No Has patient had a PCN reaction that required hospitalization: No Has patient had a PCN reaction occurring within the last 10 years: No If all of the above answers are "NO", then may proceed with Cephalosporin use.  Rash   Sulfa Antibiotics Nausea And Vomiting and Hives    itching   Cefdinir Diarrhea    Caused C Diff diarrhea   Sulfasalazine Itching and Nausea And Vomiting   Tetracycline Hives, Itching and Nausea And Vomiting    Past Medical History, Surgical history, Social history, and Family History were reviewed and updated.  Review of Systems: All other 10 point review of systems is negative.   Physical Exam:  vitals were not taken for this visit.   Wt Readings from Last 3 Encounters:  01/03/20 152 lb (68.9 kg)  12/17/19 154 lb 1.3 oz (69.9 kg)  11/05/19 157 lb 12.8 oz (71.6 kg)    Ocular: Sclerae unicteric, pupils equal, round and reactive to light Ear-nose-throat: Oropharynx clear, dentition fair Lymphatic: No cervical or supraclavicular adenopathy Lungs no rales or rhonchi, good excursion bilaterally Heart regular rate and rhythm, no murmur  appreciated Abd soft, nontender, positive bowel sounds MSK no focal spinal tenderness, no joint edema Neuro: non-focal, well-oriented, appropriate affect Breasts: Deferred   Lab Results  Component Value Date   WBC 22.8 (H) 02/01/2020   HGB 14.3 02/01/2020   HCT 43.0 02/01/2020   MCV 87.8 02/01/2020   PLT 335 02/01/2020   Lab Results  Component Value Date   FERRITIN 323 (H) 12/17/2019   IRON 60 12/17/2019   TIBC 324 12/17/2019   UIBC 264 12/17/2019   IRONPCTSAT 18 (L) 12/17/2019   Lab Results  Component Value Date   RETICCTPCT 1.3 02/01/2020   RBC 4.94 02/01/2020   No results found for: KPAFRELGTCHN, LAMBDASER, KAPLAMBRATIO Lab Results  Component Value Date   IGA 239 08/02/2013   No results found for: TOTALPROTELP, ALBUMINELP, A1GS, A2GS, BETS, BETA2SER, [place], [place], SPEI   Chemistry  Component Value Date/Time   NA 140 11/05/2019 1043   K 4.5 11/05/2019 1043   CL 104 11/05/2019 1043   CO2 31 11/05/2019 1043   BUN 13 11/05/2019 1043   CREATININE 0.76 11/05/2019 1043      Component Value Date/Time   CALCIUM 9.9 11/05/2019 1043   ALKPHOS 53 11/05/2019 1043   AST 10 (L) 11/05/2019 1043   ALT 9 11/05/2019 1043   BILITOT 0.2 (L) 11/05/2019 1043       Impression and Plan: Ms. Macmullen is a very pleasant 74 yo caucasian female with iron deficiency anemia secondary to recent GI blood loss with C.Diff/diverticulitis with contained perforation.  Iron studies are pending. We will replace if needed.  She would like to come in one more time before she moves.  We will plan to see her right after Christmas.  She was encouraged to contact our office with any questions or concerns. We can certainly see her sooner if needed.   Laverna Peace, NP 11/30/20211:26 PM

## 2020-02-01 NOTE — Progress Notes (Signed)
Subjective:   Patient ID: Erica Alvarez, female   DOB: 74 y.o.   MRN: [id number]   HPI Patient presents stating that over the last 4 to 5 days she developed severe pain in her left foot and she cannot bear weight on it. She is due to go to [employer] in the next 10 days and is worried that she will not be able to go due to the intensity of discomfort   ROS      Objective:  Physical Exam  Neurovascular status intact with exquisite discomfort sinus tarsi left extending into the distal peroneal tendon group with inflammation also associated with this     Assessment:  Inflammatory capsulitis of the sinus tarsi left with fluid buildup     Plan:  H&P reviewed the intensity of discomfort and x-rays. Today as precautionary measure I placed her in an air fracture walker to completely immobilize the foot and lower leg and I did do sterile prep and injected the sinus tarsi left 3 mg Kenalog 5 mg Xylocaine and advised on anti-inflammatories. Reappoint to recheck  X-rays indicate no signs of fracture or bone pathology and I do not think were dealing with a systemic inflammatory condition but cannot rule out

## 2020-02-02 DIAGNOSIS — Z961 Presence of intraocular lens: Secondary | ICD-10-CM | POA: Diagnosis not present

## 2020-02-02 LAB — IRON AND TIBC
Iron: 130 ug/dL (ref 41–142)
Saturation Ratios: 38 % (ref 21–57)
TIBC: 345 ug/dL (ref 236–444)
UIBC: 215 ug/dL (ref 120–384)

## 2020-02-02 LAB — FERRITIN: Ferritin: 396 ng/mL — ABNORMAL HIGH (ref 11–307)

## 2020-02-09 ENCOUNTER — Encounter: Payer: Self-pay | Admitting: Podiatry

## 2020-02-09 ENCOUNTER — Ambulatory Visit (INDEPENDENT_AMBULATORY_CARE_PROVIDER_SITE_OTHER): Payer: Medicare Other | Admitting: Podiatry

## 2020-02-09 ENCOUNTER — Other Ambulatory Visit: Payer: Self-pay

## 2020-02-09 DIAGNOSIS — M7752 Other enthesopathy of left foot: Secondary | ICD-10-CM

## 2020-02-09 DIAGNOSIS — M7671 Peroneal tendinitis, right leg: Secondary | ICD-10-CM

## 2020-02-09 MED ORDER — TRIAMCINOLONE ACETONIDE 10 MG/ML IJ SUSP
10.0000 mg | Freq: Once | INTRAMUSCULAR | Status: AC
  Administered 2020-02-09: 10 mg

## 2020-02-09 NOTE — Progress Notes (Signed)
Subjective:   Patient ID: Erica Alvarez, female   DOB: 74 y.o.   MRN: [id number]   HPI Patient states the left foot has improved quite a bit still moderately painful but better than it was and now my right outside of my foot is killing me and I am getting ready to go to [facility] and I need something   ROS      Objective:  Physical Exam  Neurovascular status intact with patient found to have discomfort in the lateral side of the left foot with improvement of the sinus tarsi right foot.  Patient states that it is been very tender     Assessment:  Peroneal tendinitis right acute in nature with what appears to be well improved left foot with patient wearing boot that she will start to reduce     Plan:  H&P reviewed condition and for the right I went ahead and did sterile prep and I injected the 5th metatarsal base and more proximal with 3 mg Kenalog 5 mg Xylocaine which was tolerated well.  Patient will be seen back for [place] to recheck

## 2020-02-22 ENCOUNTER — Other Ambulatory Visit: Payer: Self-pay | Admitting: Gastroenterology

## 2020-02-23 DIAGNOSIS — R059 Cough, unspecified: Secondary | ICD-10-CM | POA: Diagnosis not present

## 2020-02-23 DIAGNOSIS — Z1152 Encounter for screening for COVID-19: Secondary | ICD-10-CM | POA: Diagnosis not present

## 2020-02-23 DIAGNOSIS — Z20822 Contact with and (suspected) exposure to covid-19: Secondary | ICD-10-CM | POA: Diagnosis not present

## 2020-02-28 ENCOUNTER — Inpatient Hospital Stay: Payer: Medicare Other | Admitting: Family

## 2020-02-28 ENCOUNTER — Telehealth: Payer: Self-pay | Admitting: Hematology & Oncology

## 2020-02-28 ENCOUNTER — Other Ambulatory Visit (HOSPITAL_COMMUNITY): Payer: Self-pay

## 2020-02-28 ENCOUNTER — Inpatient Hospital Stay: Payer: Medicare Other

## 2020-02-28 ENCOUNTER — Other Ambulatory Visit: Payer: Self-pay | Admitting: Podiatry

## 2020-02-28 DIAGNOSIS — J988 Other specified respiratory disorders: Secondary | ICD-10-CM | POA: Diagnosis not present

## 2020-02-28 DIAGNOSIS — M779 Enthesopathy, unspecified: Secondary | ICD-10-CM

## 2020-02-28 DIAGNOSIS — Z20822 Contact with and (suspected) exposure to covid-19: Secondary | ICD-10-CM | POA: Diagnosis not present

## 2020-02-28 NOTE — Telephone Encounter (Signed)
I returned 12/27 Vm from patient.  She wished to cancel her appointments for today and did not wish to reschedule since she is moving.

## 2020-03-01 ENCOUNTER — Inpatient Hospital Stay: Payer: Medicare Other

## 2020-03-15 ENCOUNTER — Other Ambulatory Visit: Payer: Self-pay | Admitting: Gastroenterology

## 2020-03-17 ENCOUNTER — Ambulatory Visit: Payer: Medicare Other | Admitting: Family

## 2020-03-17 ENCOUNTER — Other Ambulatory Visit: Payer: Medicare Other

## 2020-03-26 ENCOUNTER — Other Ambulatory Visit: Payer: Self-pay | Admitting: Nurse Practitioner

## 2020-04-08 ENCOUNTER — Other Ambulatory Visit: Payer: Self-pay | Admitting: Gastroenterology

## 2020-04-11 DIAGNOSIS — Z23 Encounter for immunization: Secondary | ICD-10-CM | POA: Diagnosis not present

## 2020-09-07 ENCOUNTER — Other Ambulatory Visit: Payer: Self-pay | Admitting: Gastroenterology

## 2020-12-27 ENCOUNTER — Other Ambulatory Visit: Payer: Self-pay | Admitting: Gastroenterology

## 2022-11-09 IMAGING — MR MRI HIP LT W/O CONTRAST
7 series · 40 of 40 positions shown · IV contrast (Off)
Comparison: none

MRI OF THE LEFT HIP WITHOUT CONTRAST
CLINICAL HISTORY: Worsening left hip pain.
TECHNIQUE: Multisequential multiplanar imaging was performed of the lower pelvis with particular attention to the left hip.

[Series 1: z t/s/c scano · axial · left · 10.0mm · 1.64mm/px · z∈[-21,+210]mm · 4 of 12 slices shown]
[im 1/12]
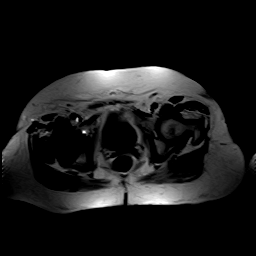
[im 4/12]
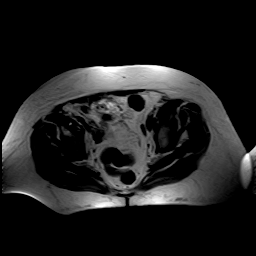
[im 8/12]
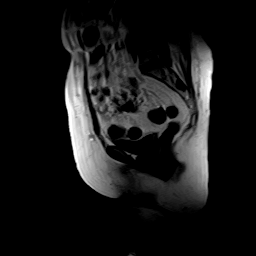
[im 12/12]
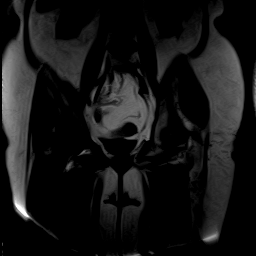

[Series 2: T1 · coronal · left · 4.0mm · 1.60mm/px · 7 of 24 slices shown (1 of 2)]
[im 1/24]
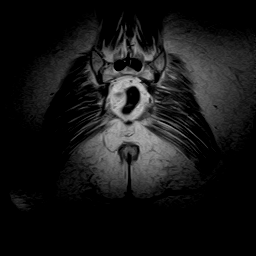
[im 4/24]
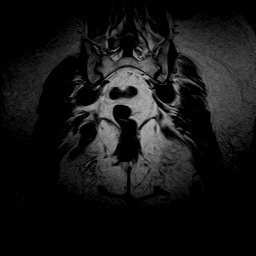
[im 8/24]
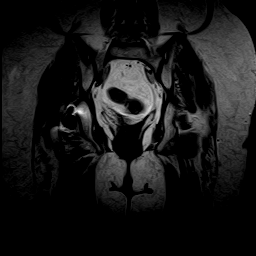
[im 12/24]
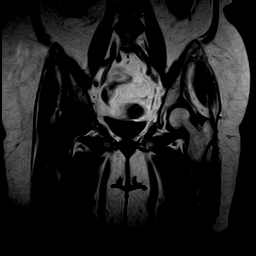
[im 16/24]
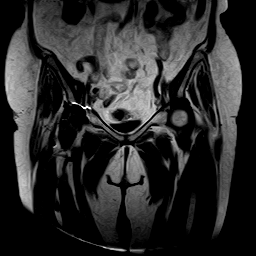
[im 20/24]
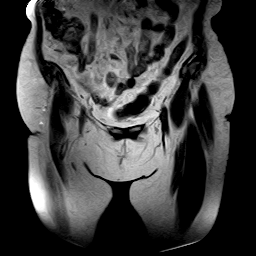
[im 24/24]
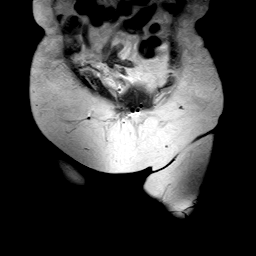

[Series 3: T1 · axial · left · 4.0mm · 1.09mm/px · z∈[-72,+33]mm · 7 of 22 slices shown (2 of 2)]
[im 1/22]
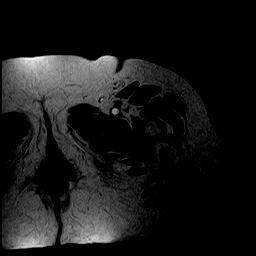
[im 4/22]
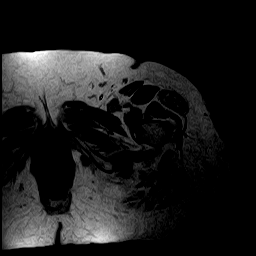
[im 8/22]
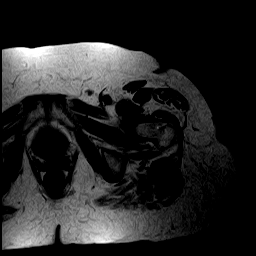
[im 11/22]
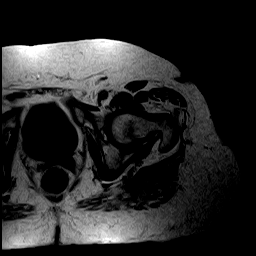
[im 15/22]
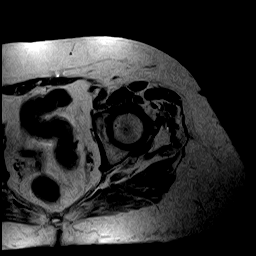
[im 18/22]
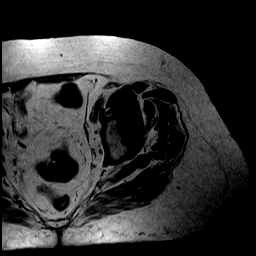
[im 22/22]
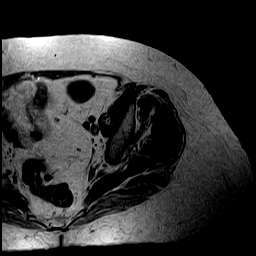

[Series 4: T2 · axial · left · 4.0mm · 1.09mm/px · z∈[-72,+33]mm · 7 of 22 slices shown (1 of 3)]
[im 1/22]
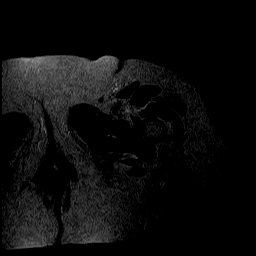
[im 4/22]
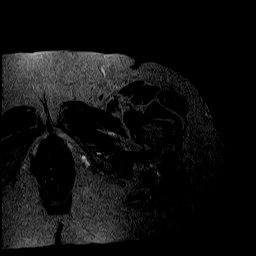
[im 8/22]
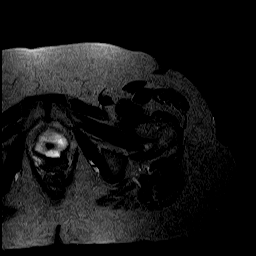
[im 11/22]
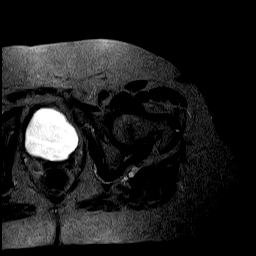
[im 15/22]
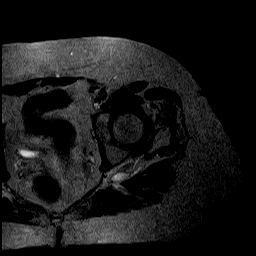
[im 18/22]
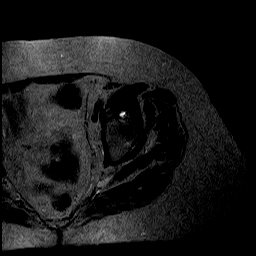
[im 22/22]
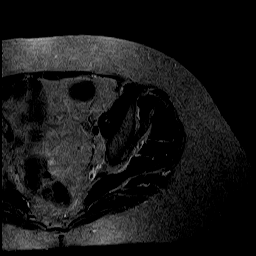

[Series 5: STIR · coronal · left · 5.0mm · 1.09mm/px · 5 of 18 slices shown]
[im 1/18]
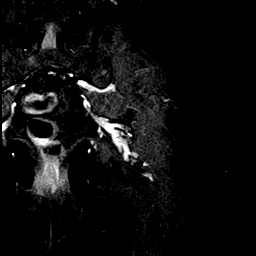
[im 5/18]
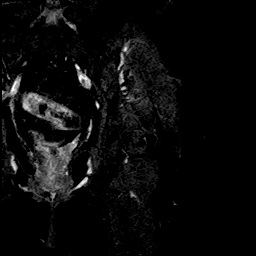
[im 9/18]
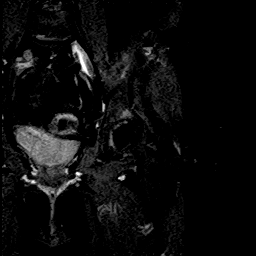
[im 13/18]
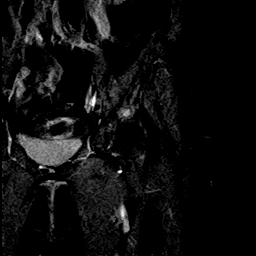
[im 18/18]
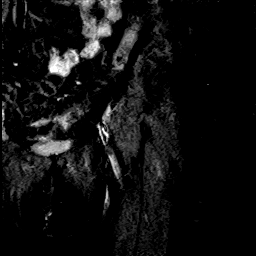

[Series 6: T2 · coronal · left · 4.0mm · 1.09mm/px · 5 of 18 slices shown (2 of 3)]
[im 1/18]
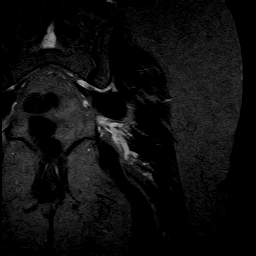
[im 5/18]
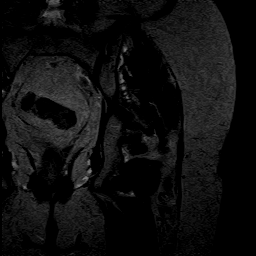
[im 9/18]
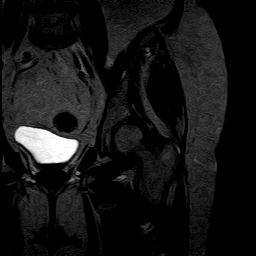
[im 13/18]
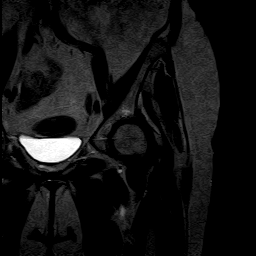
[im 18/18]
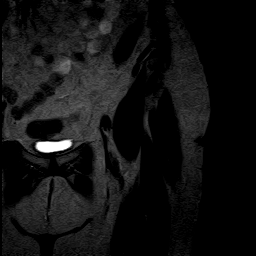

[Series 7: T2 · oblique · left · 4.0mm · 1.09mm/px · 5 of 18 slices shown (3 of 3)]
[im 1/18]
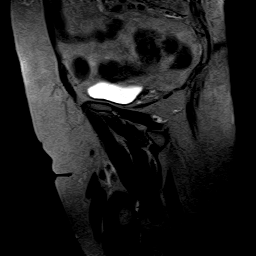
[im 5/18]
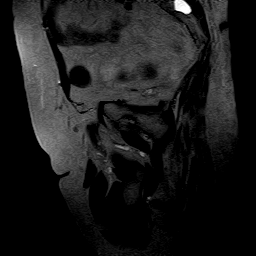
[im 9/18]
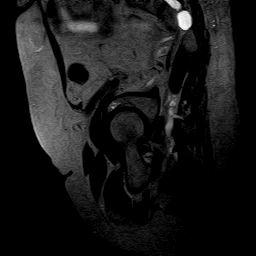
[im 13/18]
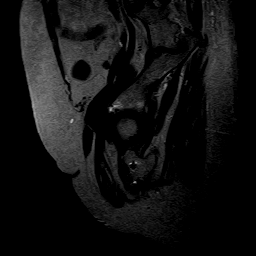
[im 18/18]
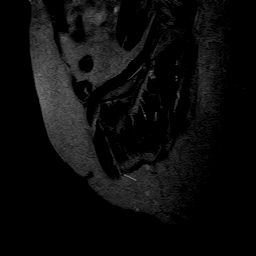

[40 of 40 positions shown; findings below may reference images not displayed]

FINDINGS: The patient has undergone a previous right total hip replacement. 

Regarding the left hip, there is moderate degenerative change with joint space narrowing, osteophytosis, and subchondral irregularity and erosion, especially of the acetabulum. There is metal artifact overlying and in the area of the left greater trochanter, which is likely related to previous surgical intervention although the patient did not indicate previous left-sided surgery. 

Also regarding the left hip, there is no soft tissue masses, fluid, or edema. No abnormal signal in the visualized musculature.
IMPRESSION: 1.
Moderate degenerative change of the left hip joint with joint space narrowing, osteophytosis, and subchondral irregularity and erosion of the acetabulum. No focal soft tissue abnormality associated with the left hip. 

2.
Evidence of previous right total hip replacement. Metal artifact overlying and in the area of the greater trochanter of the left femur of unclear etiology. This is likely related to previous surgical intervention although the patient did not indicate surgery on the left side. Clinical correlation is recommended.

## 2022-12-28 IMAGING — MR MRI LUMBAR SPINE WITHOUT CONTRAST
4 series · 48 of 48 positions shown · IV contrast (Off)
Comparison: none

MRI OF THE LUMBAR SPINE WITHOUT CONTRAST
CLINICAL HISTORY: Low back pain, left radiculopathy.
TECHNIQUE: Multisequential multiplanar imaging was performed of the lumbar spinal region.

[Series 1: z s-c scano · coronal · 6.0mm · 1.17mm/px · 4 of 6 slices shown]
[im 1/6]
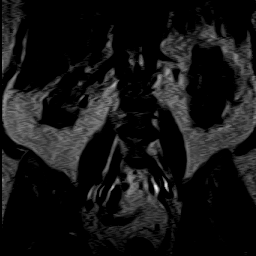
[im 2/6]
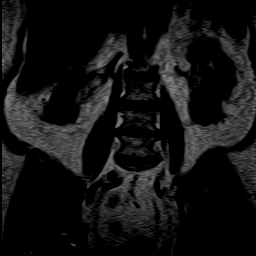
[im 4/6]
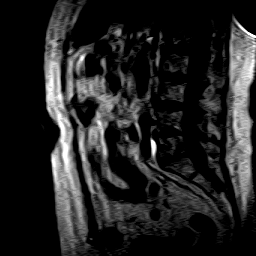
[im 6/6]
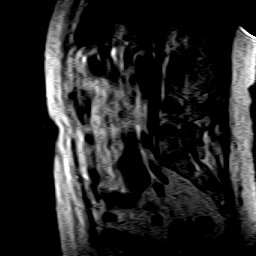

[Series 2: T2 · sagittal · 5.0mm · 1.13mm/px · 11 of 13 slices shown (1 of 2)]
[im 1/13]
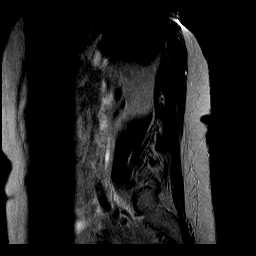
[im 2/13]
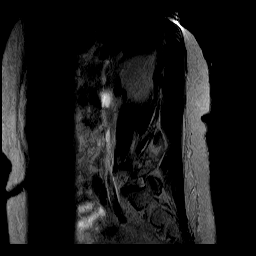
[im 3/13]
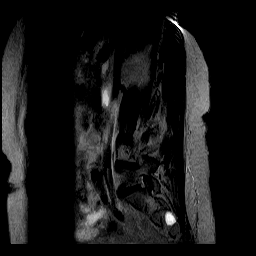
[im 4/13]
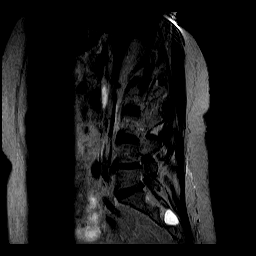
[im 5/13]
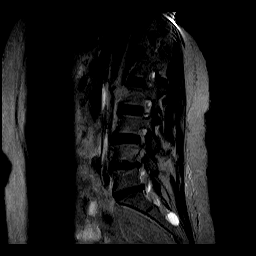
[im 7/13]
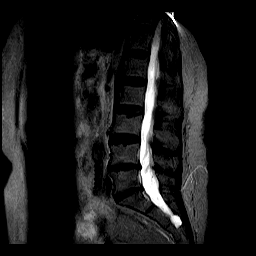
[im 8/13]
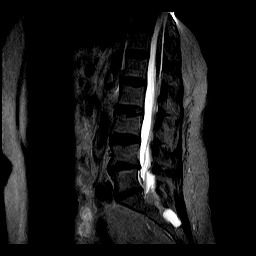
[im 9/13]
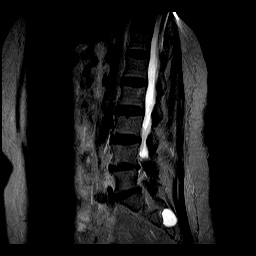
[im 10/13]
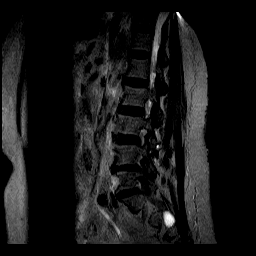
[im 11/13]
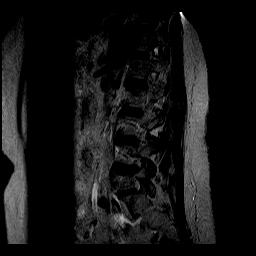
[im 13/13]
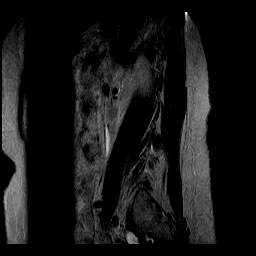

[Series 3: T1 · sagittal · 5.0mm · 1.13mm/px · 11 of 13 slices shown]
[im 1/13]
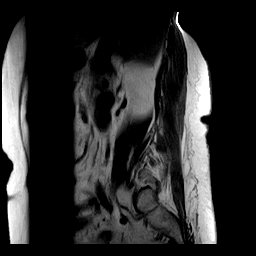
[im 2/13]
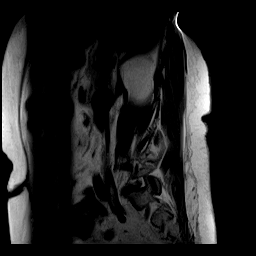
[im 3/13]
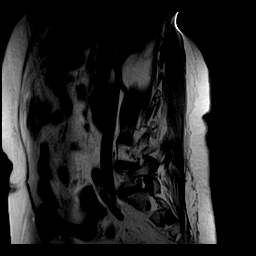
[im 4/13]
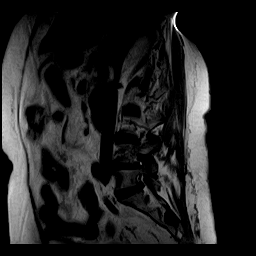
[im 5/13]
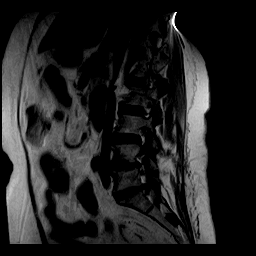
[im 7/13]
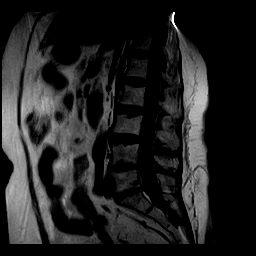
[im 8/13]
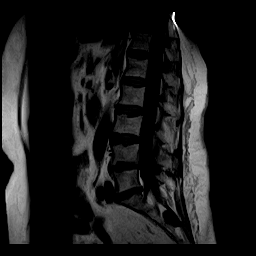
[im 9/13]
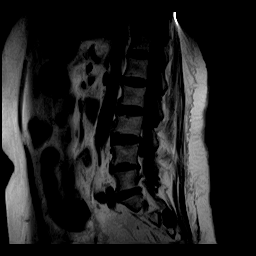
[im 10/13]
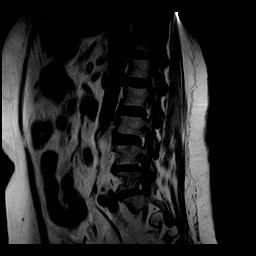
[im 11/13]
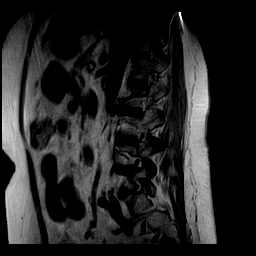
[im 13/13]
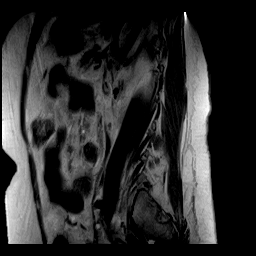

[Series 5: T2 · axial · 4.0mm · 1.17mm/px · z∈[-95,+106]mm · 22 of 27 slices shown (2 of 2)]
[im 1/27]
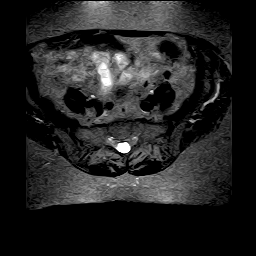
[im 2/27]
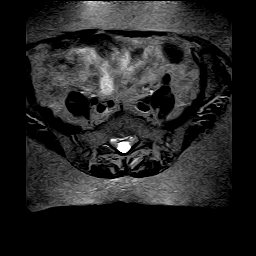
[im 3/27]
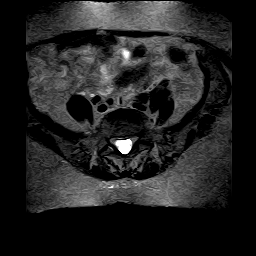
[im 4/27]
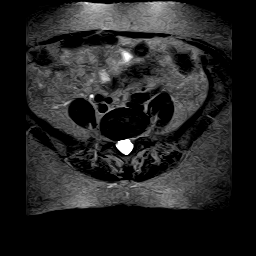
[im 5/27]
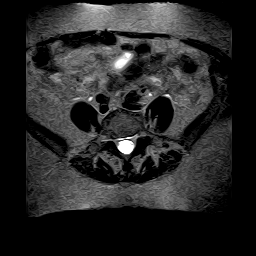
[im 7/27]
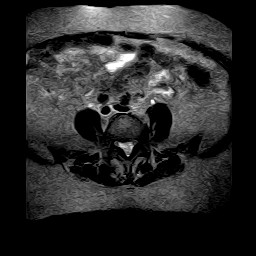
[im 8/27]
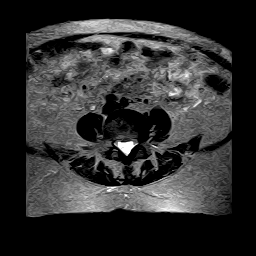
[im 9/27]
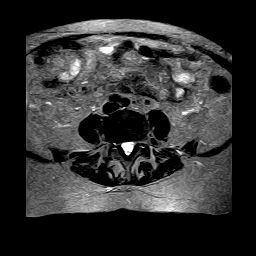
[im 10/27]
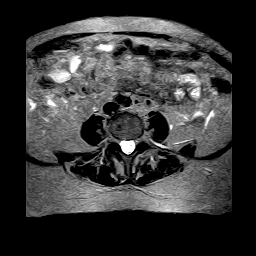
[im 12/27]
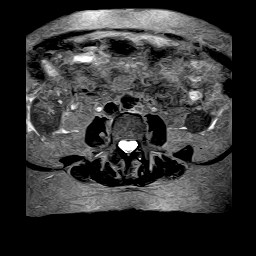
[im 13/27]
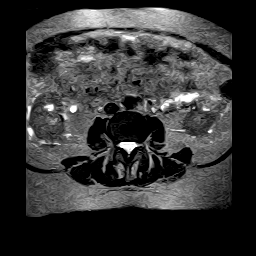
[im 14/27]
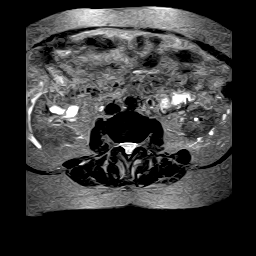
[im 15/27]
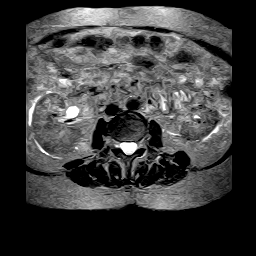
[im 17/27]
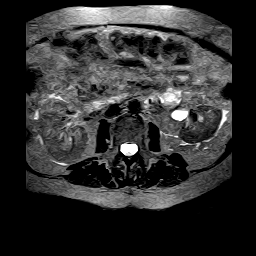
[im 18/27]
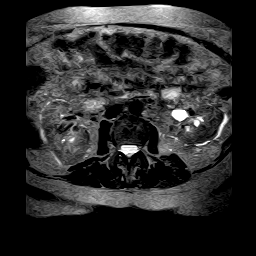
[im 19/27]
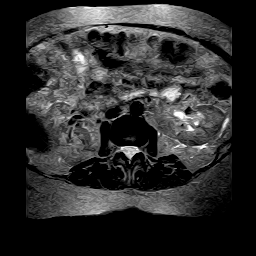
[im 20/27]
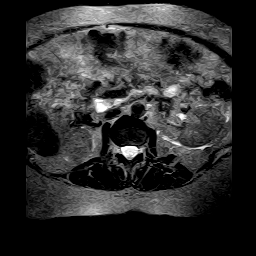
[im 22/27]
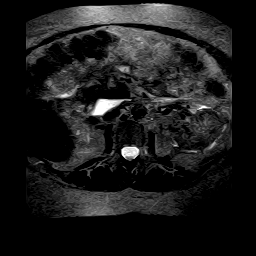
[im 23/27]
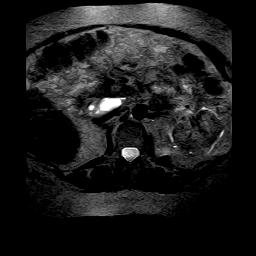
[im 24/27]
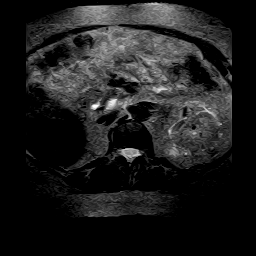
[im 25/27]
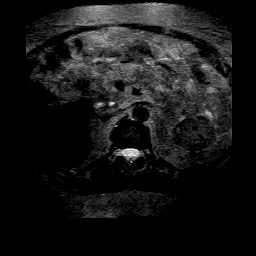
[im 27/27]
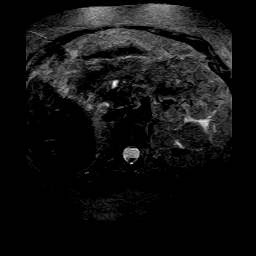

[48 of 48 positions shown; findings below may reference images not displayed]

FINDINGS: There is a normal marrow signal noted throughout the lumbar vertebral bodies. The conus medullaris is unremarkable and there is no obvious intradural abnormality noted. There is moderate facet arthrosis and ligamentum flavum hypertrophy throughout the lumbar spinal region. There is a large perineural cyst (Tarlov cyst) at the S2-S3 level measuring 2.4 cm. 

At L1-L2 level, there is no evidence of disc herniation, neural foraminal narrowing, or spinal stenosis. 

At L2-L3 level, there is no evidence of disc herniation, neural foraminal narrowing, or spinal stenosis. 

At L3-L4 level, there is no evidence of disc herniation, neural foraminal narrowing, or spinal stenosis. 

At L4-L5 level, there is narrowing, anterior osteophyte, and 1.5 to 2 mm bulging. 

At L5-S1 level, there is narrowing, anterior, posterior, and lateral osteophyte, and 2 mm right lateral herniation with moderate effacement of the entrance to the right neural foramen.
IMPRESSION: 1.
Narrowing, anterior, posterior, and lateral osteophyte, and 2 mm right lateral herniation at L5-S1 with moderate effacement of the entrance to the right neural foramen. Narrowing, anterior osteophyte, and 1.5 to 2 mm bulging at L4-L5. Moderate facet arthrosis but no true spinal stenosis. 

2.
Large perineural cyst (Tarlov cyst) at the S2-S3 level measuring 2.4 cm. Large Tarlov cysts such as this can be symptomatic.

## 2023-01-23 IMAGING — MR MRI ABDOMEN WITHOUT AND WITH CONTRAST ADRENALS
7 series · 48 of 48 positions shown · IV contrast (Off)
Comparison: Outside CT of the abdomen from 04/23/2022 from [HOSPITAL].

﻿MRI OF THE ABDOMEN WITHOUT AND WITH CONTRAST
CLINICAL HISTORY: Left adrenal nodule.
TECHNIQUE: Multiplanar, multisequence MRI was performed on a [DATE] Tesla open MRI without and with 6 mL Gadavist.

[Series 1: cor scano · coronal · 8.0mm · 1.56mm/px · 1 of 5 slices shown]
[im 1/5]
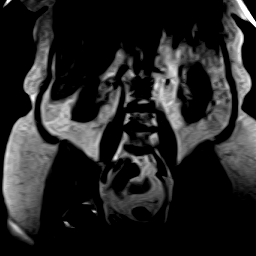

[Series 2: T2 · axial · 6.0mm · 1.48mm/px · z∈[+38,+185]mm · 8 of 22 slices shown]
[im 1/22]
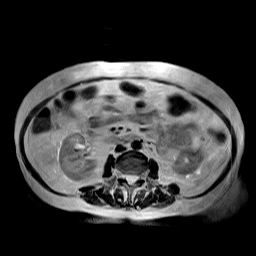
[im 4/22]
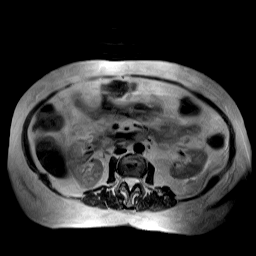
[im 7/22]
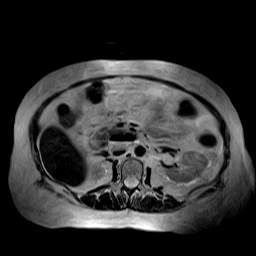
[im 10/22]
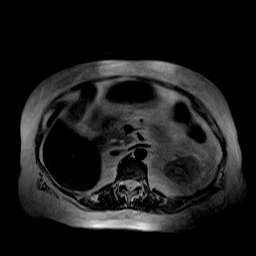
[im 13/22]
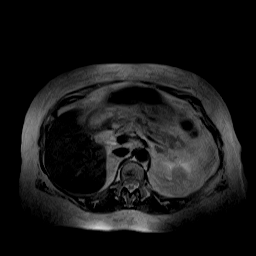
[im 16/22]
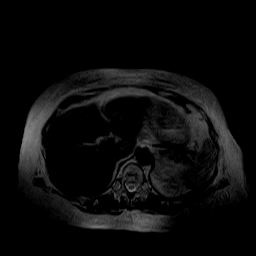
[im 19/22]
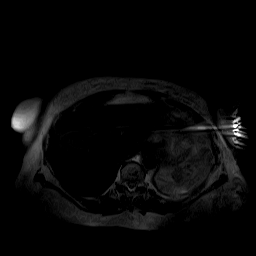
[im 22/22]
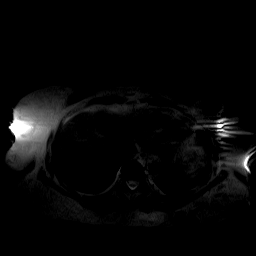

[Series 4: T1 · coronal · 7.0mm · 1.48mm/px · 7 of 18 slices shown (1 of 3)]
[im 1/18]
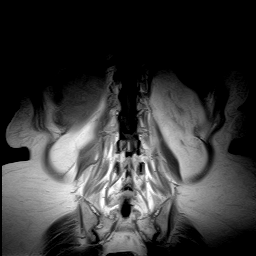
[im 3/18]
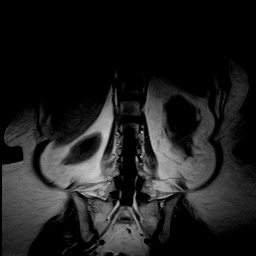
[im 6/18]
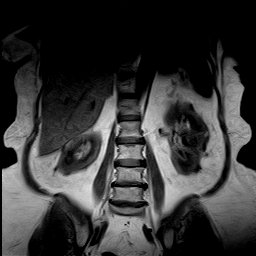
[im 9/18]
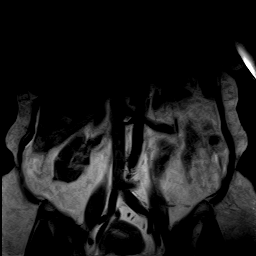
[im 12/18]
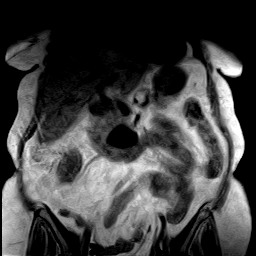
[im 15/18]
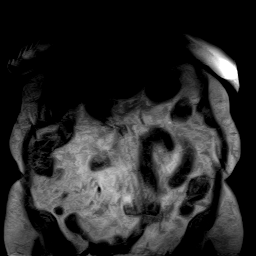
[im 18/18]
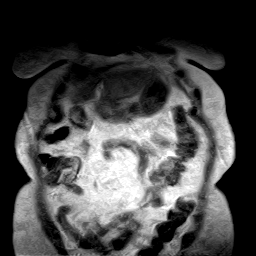

[Series 6: zout phas bh · axial · 6.0mm · 1.48mm/px · z∈[+38,+171]mm · 8 of 20 slices shown]
[im 1/20]
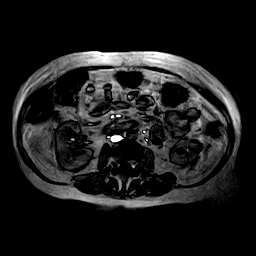
[im 3/20]
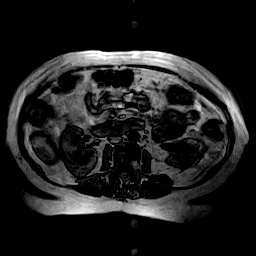
[im 6/20]
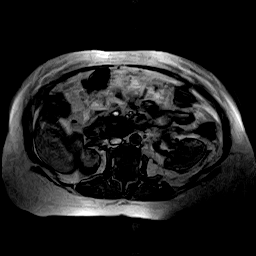
[im 9/20]
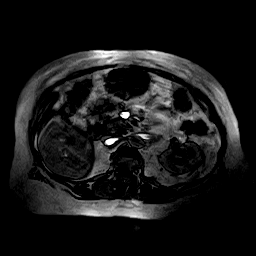
[im 11/20]
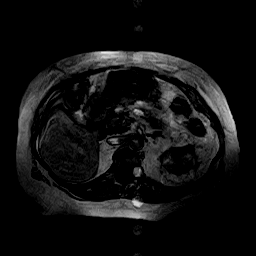
[im 14/20]
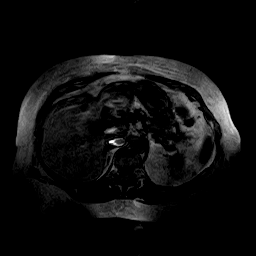
[im 17/20]
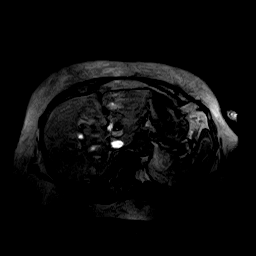
[im 20/20]
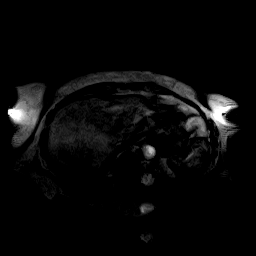

[Series 7: T1 · axial · 6.0mm · 1.48mm/px · z∈[+33,+175]mm · 8 of 20 slices shown (2 of 3)]
[im 1/20]
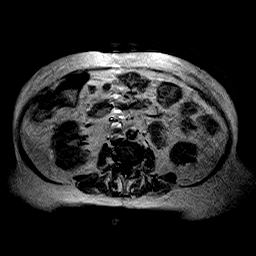
[im 3/20]
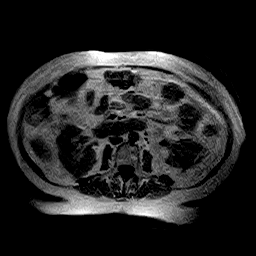
[im 6/20]
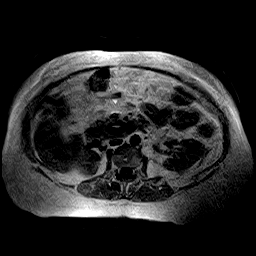
[im 9/20]
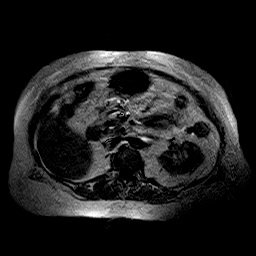
[im 11/20]
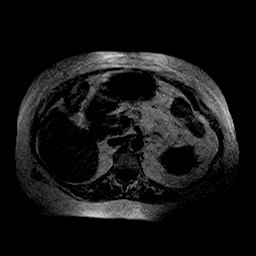
[im 14/20]
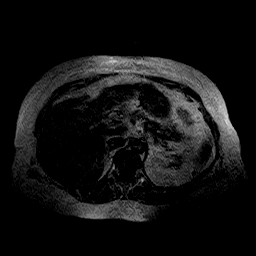
[im 17/20]
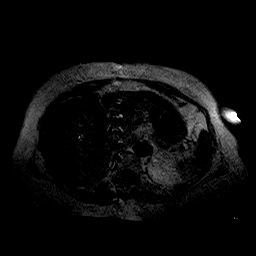
[im 20/20]
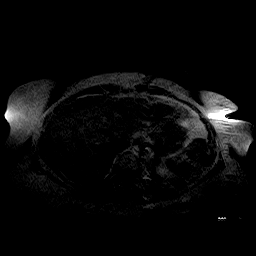

[Series 8: z in phas · axial · 6.0mm · 1.48mm/px · z∈[+38,+171]mm · 8 of 20 slices shown]
[im 1/20]
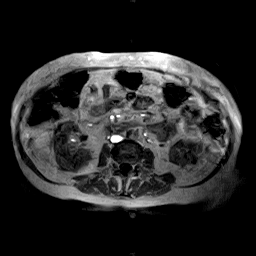
[im 3/20]
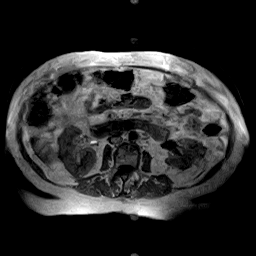
[im 6/20]
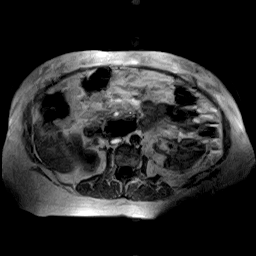
[im 9/20]
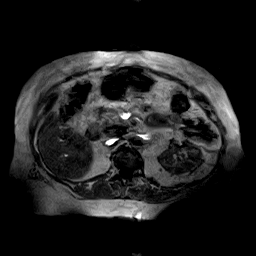
[im 11/20]
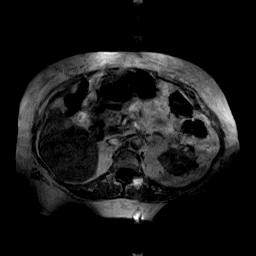
[im 14/20]
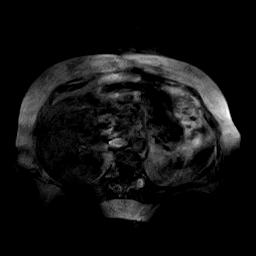
[im 17/20]
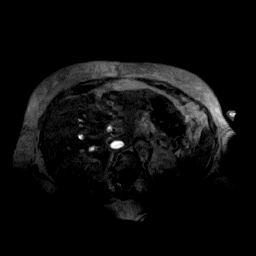
[im 20/20]
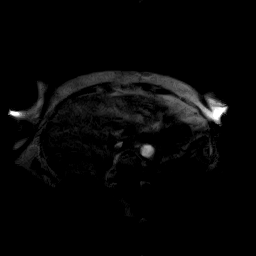

[Series 9: T1 · axial · 6.0mm · 1.48mm/px · z∈[+33,+175]mm · 8 of 20 slices shown (3 of 3)]
[im 1/20]
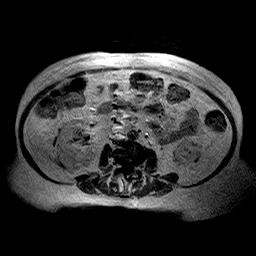
[im 3/20]
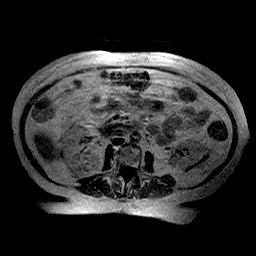
[im 6/20]
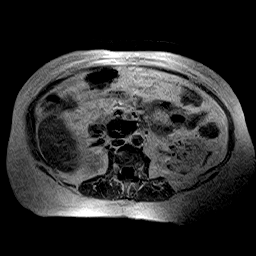
[im 9/20]
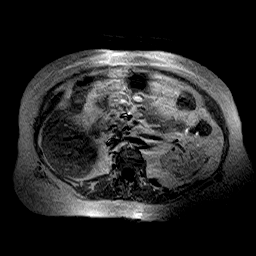
[im 11/20]
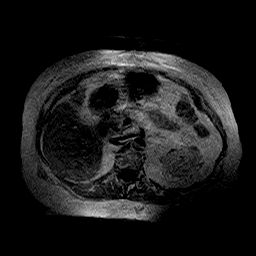
[im 14/20]
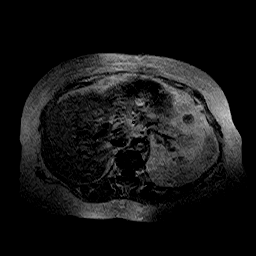
[im 17/20]
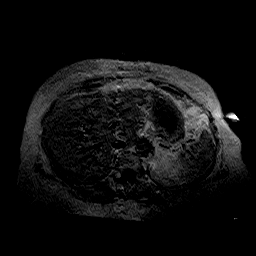
[im 20/20]
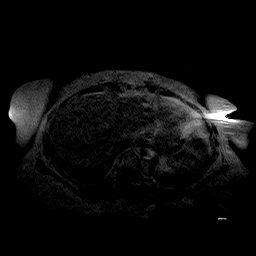

[48 of 48 positions shown; findings below may reference images not displayed]

FINDINGS: Left adrenal nodule identified measuring 1.3 cm with decreased signal on opposed phase imaging likely represents an adrenal adenoma.  

The spleen, pancreas, and right adrenal gland appear grossly unremarkable.

No evidence of retroperitoneal lymphadenopathy.

The liver is enlarged measuring 18.8 cm in sagittal length. 

This study is in part limited due to patient motion and degradation artifact which limit detail.  

The visualized kidneys appear grossly unremarkable.
IMPRESSION: 1. Left adrenal nodule measuring 1.3 cm likely represents an adrenal adenoma with drop in signal on opposed phase imaging. 

2. Evidence of hepatomegaly.

3. This study is in part limited due to motion and degradation artifact which limit detail.
